# Patient Record
Sex: Female | Born: 1955 | Race: White | Hispanic: No | Marital: Married | Smoking: Former smoker
Health system: Southern US, Community
[De-identification: ages and names within clinical notes are randomized; demographics above are authoritative.]

## PROBLEM LIST (undated history)

## (undated) DIAGNOSIS — F32A Depression, unspecified: Secondary | ICD-10-CM

## (undated) DIAGNOSIS — G4730 Sleep apnea, unspecified: Secondary | ICD-10-CM

## (undated) DIAGNOSIS — Z973 Presence of spectacles and contact lenses: Secondary | ICD-10-CM

## (undated) DIAGNOSIS — M25569 Pain in unspecified knee: Secondary | ICD-10-CM

## (undated) DIAGNOSIS — R5383 Other fatigue: Secondary | ICD-10-CM

## (undated) DIAGNOSIS — I10 Essential (primary) hypertension: Secondary | ICD-10-CM

## (undated) DIAGNOSIS — T7840XA Allergy, unspecified, initial encounter: Secondary | ICD-10-CM

## (undated) DIAGNOSIS — J40 Bronchitis, not specified as acute or chronic: Secondary | ICD-10-CM

## (undated) DIAGNOSIS — M2550 Pain in unspecified joint: Secondary | ICD-10-CM

## (undated) DIAGNOSIS — F419 Anxiety disorder, unspecified: Secondary | ICD-10-CM

## (undated) DIAGNOSIS — M549 Dorsalgia, unspecified: Secondary | ICD-10-CM

## (undated) DIAGNOSIS — E538 Deficiency of other specified B group vitamins: Secondary | ICD-10-CM

## (undated) DIAGNOSIS — K219 Gastro-esophageal reflux disease without esophagitis: Secondary | ICD-10-CM

## (undated) DIAGNOSIS — R0989 Other specified symptoms and signs involving the circulatory and respiratory systems: Secondary | ICD-10-CM

## (undated) DIAGNOSIS — D649 Anemia, unspecified: Secondary | ICD-10-CM

## (undated) DIAGNOSIS — M1990 Unspecified osteoarthritis, unspecified site: Secondary | ICD-10-CM

## (undated) DIAGNOSIS — F329 Major depressive disorder, single episode, unspecified: Secondary | ICD-10-CM

## (undated) HISTORY — DX: Depression, unspecified: F32.A

## (undated) HISTORY — DX: Bronchitis, not specified as acute or chronic: J40

## (undated) HISTORY — DX: Anemia, unspecified: D64.9

## (undated) HISTORY — DX: Deficiency of other specified B group vitamins: E53.8

## (undated) HISTORY — DX: Pain in unspecified joint: M25.50

## (undated) HISTORY — PX: JOINT REPLACEMENT: SHX530

## (undated) HISTORY — PX: REPLACEMENT TOTAL KNEE: SUR1224

## (undated) HISTORY — DX: Allergy, unspecified, initial encounter: T78.40XA

## (undated) HISTORY — DX: Other fatigue: R53.83

## (undated) HISTORY — DX: Anxiety disorder, unspecified: F41.9

## (undated) HISTORY — PX: BREAST SURGERY: SHX581

## (undated) HISTORY — PX: COLONOSCOPY: SHX174

## (undated) HISTORY — DX: Unspecified osteoarthritis, unspecified site: M19.90

## (undated) HISTORY — PX: SMALL INTESTINE SURGERY: SHX150

## (undated) HISTORY — DX: Presence of spectacles and contact lenses: Z97.3

## (undated) HISTORY — DX: Gastro-esophageal reflux disease without esophagitis: K21.9

## (undated) HISTORY — DX: Dorsalgia, unspecified: M54.9

## (undated) HISTORY — DX: Sleep apnea, unspecified: G47.30

## (undated) HISTORY — DX: Major depressive disorder, single episode, unspecified: F32.9

## (undated) HISTORY — DX: Other specified symptoms and signs involving the circulatory and respiratory systems: R09.89

## (undated) HISTORY — DX: Pain in unspecified knee: M25.569

## (undated) HISTORY — DX: Essential (primary) hypertension: I10

---

## 1983-11-01 HISTORY — PX: TUBAL LIGATION: SHX77

## 2001-10-17 ENCOUNTER — Ambulatory Visit (HOSPITAL_COMMUNITY): Admission: RE | Admit: 2001-10-17 | Discharge: 2001-10-17 | Payer: Self-pay | Admitting: Internal Medicine

## 2004-08-04 ENCOUNTER — Ambulatory Visit (HOSPITAL_BASED_OUTPATIENT_CLINIC_OR_DEPARTMENT_OTHER): Admission: RE | Admit: 2004-08-04 | Discharge: 2004-08-04 | Payer: Self-pay | Admitting: *Deleted

## 2006-09-15 ENCOUNTER — Ambulatory Visit: Payer: Self-pay | Admitting: Gastroenterology

## 2006-10-20 ENCOUNTER — Ambulatory Visit: Payer: Self-pay | Admitting: Internal Medicine

## 2006-10-20 ENCOUNTER — Ambulatory Visit (HOSPITAL_COMMUNITY): Admission: RE | Admit: 2006-10-20 | Discharge: 2006-10-20 | Payer: Self-pay | Admitting: Internal Medicine

## 2009-12-17 ENCOUNTER — Ambulatory Visit: Payer: Self-pay | Admitting: Internal Medicine

## 2009-12-17 DIAGNOSIS — R1310 Dysphagia, unspecified: Secondary | ICD-10-CM | POA: Insufficient documentation

## 2009-12-17 DIAGNOSIS — R112 Nausea with vomiting, unspecified: Secondary | ICD-10-CM | POA: Insufficient documentation

## 2009-12-17 DIAGNOSIS — K219 Gastro-esophageal reflux disease without esophagitis: Secondary | ICD-10-CM

## 2009-12-18 ENCOUNTER — Encounter: Payer: Self-pay | Admitting: Internal Medicine

## 2009-12-25 ENCOUNTER — Ambulatory Visit: Payer: Self-pay | Admitting: Internal Medicine

## 2009-12-25 ENCOUNTER — Ambulatory Visit (HOSPITAL_COMMUNITY): Admission: RE | Admit: 2009-12-25 | Discharge: 2009-12-25 | Payer: Self-pay | Admitting: Internal Medicine

## 2010-01-04 ENCOUNTER — Encounter: Payer: Self-pay | Admitting: Internal Medicine

## 2010-02-01 ENCOUNTER — Ambulatory Visit: Payer: Self-pay | Admitting: Internal Medicine

## 2010-11-25 ENCOUNTER — Other Ambulatory Visit (HOSPITAL_COMMUNITY): Payer: Self-pay | Admitting: Surgery

## 2010-11-29 ENCOUNTER — Encounter
Admission: RE | Admit: 2010-11-29 | Discharge: 2010-11-30 | Payer: Self-pay | Source: Home / Self Care | Attending: Surgery | Admitting: Surgery

## 2010-11-30 NOTE — Assessment & Plan Note (Signed)
Summary: dysphagia,GERD/ss   Visit Type:  Initial Consult Referring Provider:  Charm Barges Primary Care Provider:  Charm Barges  Chief Complaint:  dysphagia/gerd.  History of Present Illness: Ms. Patricia Carr is a pleasant 55 year old Caucasian female, patient of Dr. Samuel Jester, who presents for further evaluation of chronic intermittent nausea and vomiting and reflux symptoms. Symptoms have been occuring for the last one year. They seem to be worse lately. She's having up to 2 days a week when she wakes up vomiting yellow, bitter fluid. She tries not eating too late at night. She states she does have some heartburn and epigastric discomfort. When she eats or drinks hot or cold things she has burning in her chest. She had been on Prevacid 30 mg b.i.d. with good results up until the last several months. She stopped the Prevacid and started Dexilant one week ago. She really hasn't noticed any difference yet. She also complains of difficulty swallowing solid foods. She states that 2 years ago she had an upper GI series at [facility] and she had obvious refluxing on that study. She had an EGD in the remote past. She previously failed Prilosec. She has alternating constipation and diarrhea. She stays more constipated except during her menstrual cycles. She recently started Restora and has had better regulation of her bowel movements. She denies any melena or rectal bleeding. She also c/o chronic dry cough, hoarseness. Wt gain of 50 pounds in the last five years.  Labs from February 2011 showed normal creatinine, LFTs normal, CBC normal, TSH normal.      Current Medications (verified): 1)  C-Pap .... As Directed 2)  Calcium 500 Mg Plus D .... Take 1 Tablet By Mouth Two Times A Day 3)  Vitamin D 500 Iu .... Take 1 Tablet By Mouth Twice A Day 4)  Glucosamine .... Take 1 Tablet By Mouth Once A Day 5)  Asa 81 Mg .... Take 2 Daily Tablets Daily With Niaspan 6)  Niaspan 500 Mg Cr-Tabs (Niacin (Antihyperlipidemic)) ....  Take Four Tablets Once Daily 7)  Wellbutrin Xl 300 Mg Xr24h-Tab (Bupropion Hcl) .... Take 1 Tablet By Mouth Once A Day 8)  Losartan Potassium-Hctz 100-25 Mg Tabs (Losartan Potassium-Hctz) .... Take 1 Tablet By Mouth Once A Day 9)  Fish Oil 1200 Mg .... One Tablet Daily 10)  Dexilant 60 Mg Cpdr (Dexlansoprazole) .... Take 1 Tablet By Mouth Once A Day 11)  Balziva 0.4-35 Mg-Mcg Tabs (Norethindrone-Eth Estradiol) .... As Needed 12)  Restora  Caps (Probiotic Product) .... One By Mouth Daily  Allergies (verified): 1)  ! Iodine  Past History:  Past Medical History: GERD Hyperlipidemia Hypertension Obesity Sleep apnea Colonoscopy 12/07, diverticulosis Colonoscopy 12/02, diverticulosis  Past Surgical History: Tubal Ligation  Family History: Mother, colon cancer Father, 72, deceased [place] Sister, colon polyps at 79 Maternal aunt, cousin, grandmother all with h/o CRC.  Social History: Married. Two children. Engineer, production, [employer]. Never habitual smoker. No alcohol. No drugs.  Review of Systems General:  Denies fever, chills, sweats, anorexia, and weight loss. Eyes:  Denies vision loss. ENT:  Complains of hoarseness and difficulty swallowing; denies nasal congestion and sore throat. CV:  Denies chest pains, angina, palpitations, dyspnea on exertion, and peripheral edema. Resp:  Complains of cough; denies dyspnea at rest and dyspnea with exercise; dry cough. GI:  See HPI. GU:  Denies urinary burning and blood in urine. MS:  Denies joint pain / LOM. Derm:  Denies rash and itching. Neuro:  Denies weakness, frequent headaches, memory loss,  and confusion. Psych:  Denies depression and anxiety. Endo:  Denies unusual weight change. Heme:  Denies bruising and bleeding. Allergy:  Denies hives and rash.  Vital Signs:  Patient profile:   55 year old female Height:      63 inches Weight:      244 pounds BMI:     43.38 Temp:     98.7 degrees F oral Pulse rate:   76 / minute BP  sitting:   138 / 80  (left arm) Cuff size:   regular  Vitals Entered By: Cloria Spring LPN (December 17, 2009 2:24 PM)  Physical Exam  General:  Well developed, well nourished, no acute distress.obese.   Head:  Normocephalic and atraumatic. Eyes:  Conjunctivae pink, no scleral icterus.  Mouth:  Oropharyngeal mucosa moist, pink.  No lesions, erythema or exudate.    Neck:  Supple; no masses or thyromegaly. Lungs:  Clear throughout to auscultation. Heart:  Regular rate and rhythm; no murmurs, rubs,  or bruits. Abdomen:  normal bowel sounds, obese, no hernia, no tenderness, no masses, and no hepatomegally or splenomegaly.  No abd bruit or hernia. Extremities:  No clubbing, cyanosis, edema or deformities noted. Neurologic:  Alert and  oriented x4;  grossly normal neurologically. Skin:  Intact without significant lesions or rashes. Cervical Nodes:  No significant cervical adenopathy. Psych:  Alert and cooperative. Normal mood and affect.  Impression & Recommendations:  Problem # 1:  GERD (ICD-530.81)  Gerd symptoms including heartburn, possible LPR with chronic cough and hoarseness but c/o dysphagia/odynophagia. Early morning N/V. Unlikely due to biliary disease. ?ulcerative reflux esophagitis, esophageal stricture. Symptoms poorly controlled on double dose Prevacid. Now on Dexilant. Would recommend EGD possible ED. EGD to be performed in near future.  Risks, alternatives, benefits including but not limited to risk of reaction to medications, bleeding, infection, and perforation addressed.  Patient voiced understanding and verbal consent obtained. Hold ASA four days. Continue Dexilant for now to see if responds but would consider taking 30 mins before evening meal. Antireflux information provided. Recommend wt reduction of at least 20 pounds.  Orders: Consultation Level III ([address])    I would like to thank Prudy Feeler, PA-C with Dr. Samuel Jester for allowing [place] to take part in the care of  this nice patient.

## 2010-11-30 NOTE — Letter (Signed)
Summary: REFERRAL/DR BUTLER  REFERRAL/DR BUTLER   Imported By: Diana Eves 12/18/2009 15:21:13  _____________________________________________________________________  External Attachment:    Type:   Image     Comment:   External Document

## 2010-11-30 NOTE — Letter (Signed)
Summary: EGD ORDER  EGD ORDER   Imported By: Ave Filter 12/17/2009 17:02:45  _____________________________________________________________________  External Attachment:    Type:   Image     Comment:   External Document

## 2010-11-30 NOTE — Letter (Signed)
Summary: Patient Notice, Endo Biopsy Results  [facility]  [address]   [place], [place] [address]   Phone: [phone]  Fax: [phone]       January 04, 2010   Patricia Carr [address] [place], [place]  [address] Dec 21, 1955    Dear Patricia Carr,  I am pleased to inform you that the biopsies taken during your recent endoscopic examination did not show any evidence of infection or cancer upon pathologic examination.  They did show mild inflammmation.  Additional information/recommendations:  Continue with the treatment plan as outlined on the day of your exam.  Please call us if you are having persistent problems or have questions about your condition that have not been fully answered at this time.  Sincerely,    R. Roetta Sessions MD  [facility] Ph: [phone]   Fax: [phone]   Appended Document: Patient Notice, Endo Biopsy Results Letter mailed.

## 2010-11-30 NOTE — Assessment & Plan Note (Signed)
Summary: PP FU from EGD- dysphagia, reflux, GLU   Primary Care Provider:  Dr.  Charm Barges  Chief Complaint:  follow up from procedure and doing ok.  History of Present Illness: 55 y/o caucasian female w/ recent EGD w/ esophageal dilatation. Nausea much better, dysphagia much better.  One episode of vomiting after eating spicy food late.  Denies GERD.  Bm normal daily.  Now on culterelle once daily.  Denies abd pain.  c/o bloating.  Trying high fiber diet.  Wants to lose weight.  FH colon ca in mother.  On Dexilant 60mg  daily.  Current Problems (verified): 1)  Colorectal Cancer, Family Hx  (ICD-V16.0) 2)  Nausea With Vomiting  (ICD-787.01) 3)  Gerd  (ICD-530.81) 4)  Dysphagia Unspecified  (ICD-787.20)  Current Medications (verified): 1)  C-Pap .... As Directed 2)  Calcium 500 Mg Plus D .... Take 1 Tablet By Mouth Two Times A Day 3)  Vitamin D 500 Iu .... Take 1 Tablet By Mouth Twice A Day 4)  Asa 81 Mg .... Take 2 Daily Tablets Daily With Niaspan 5)  Niaspan 500 Mg Cr-Tabs (Niacin (Antihyperlipidemic)) .... Take Four Tablets Once Daily 6)  Wellbutrin Xl 300 Mg Xr24h-Tab (Bupropion Hcl) .... Take 1 Tablet By Mouth Once A Day 7)  Losartan Potassium-Hctz 100-25 Mg Tabs (Losartan Potassium-Hctz) .... Take 1 Tablet By Mouth Once A Day 8)  Fish Oil 1200 Mg .... One Tablet Daily 9)  Dexilant 60 Mg Cpdr (Dexlansoprazole) .... Take 1 Tablet By Mouth Once A Day 10)  Balziva 0.4-35 Mg-Mcg Tabs (Norethindrone-Eth Estradiol) .... As Needed 11)  Probiotics .... Once Daily  Allergies (verified): 1)  ! Iodine  Vital Signs:  Patient profile:   55 year old female Height:      63 inches Weight:      242 pounds BMI:     43.02 Temp:     97.8 degrees F oral Pulse rate:   84 / minute BP sitting:   130 / 78  (left arm) Cuff size:   regular  Vitals Entered By: Hendricks Limes LPN (February 01, 6300 3:16 PM)  Physical Exam  General:  obese.  Well developed, no acute distress. Head:  Normocephalic and  atraumatic. Eyes:  Sclera clear. Ears:  Normal auditory acuity. Nose:  No deformity, discharge,  or lesions. Mouth:  No deformity or lesions, dentition normal. Neck:  Supple; no masses or thyromegaly. Lungs:  Clear throughout to auscultation. Heart:  Regular rate and rhythm; no murmurs, rubs,  or bruits. Abdomen:  Soft, nontender and nondistended. No masses, hepatosplenomegaly or hernias noted. Normal bowel sounds.without guarding and without rebound.   Msk:  Symmetrical with no gross deformities. Normal posture. Pulses:  Normal pulses noted. Extremities:  No clubbing, cyanosis, edema or deformities noted. Neurologic:  Alert and  oriented x4;  grossly normal neurologically. Skin:  Intact without significant lesions or rashes. Cervical Nodes:  No significant cervical adenopathy. Psych:  Alert and cooperative. Normal mood and affect.  Impression & Recommendations:  Problem # 1:  GERD (ICD-530.81) 55 y/o caucasian female w/ erosive reflux esophagitis and recent empiric dilatation.  She is doing very well on dexilant 60mg  daily.  Orders: Est. Patient Level III (60109)  Problem # 2:  DYSPHAGIA UNSPECIFIED (ICD-787.20) See #1  Problem # 3:  COLORECTAL CANCER, FAMILY HX (ICD-V16.0) Assessment: Comment Only  Patient Instructions: 1)  Continue dexilant 60mg  daily 2)  Weight loss 1-2# PER WEEK 3)  Appt prior to colonoscopy 10/2011 or sooner if  needed

## 2010-12-13 ENCOUNTER — Ambulatory Visit (HOSPITAL_COMMUNITY)
Admission: RE | Admit: 2010-12-13 | Discharge: 2010-12-13 | Disposition: A | Discharge status: Home / Self Care | Payer: PRIVATE HEALTH INSURANCE | Source: Ambulatory Visit | Attending: Surgery | Admitting: Surgery

## 2010-12-13 ENCOUNTER — Other Ambulatory Visit (HOSPITAL_COMMUNITY): Payer: Self-pay

## 2010-12-13 DIAGNOSIS — I10 Essential (primary) hypertension: Secondary | ICD-10-CM | POA: Insufficient documentation

## 2010-12-13 DIAGNOSIS — Z6841 Body Mass Index (BMI) 40.0 and over, adult: Secondary | ICD-10-CM | POA: Insufficient documentation

## 2010-12-13 DIAGNOSIS — Z13820 Encounter for screening for osteoporosis: Secondary | ICD-10-CM | POA: Insufficient documentation

## 2010-12-13 DIAGNOSIS — G4733 Obstructive sleep apnea (adult) (pediatric): Secondary | ICD-10-CM | POA: Insufficient documentation

## 2010-12-13 DIAGNOSIS — K219 Gastro-esophageal reflux disease without esophagitis: Secondary | ICD-10-CM | POA: Insufficient documentation

## 2010-12-13 DIAGNOSIS — R0602 Shortness of breath: Secondary | ICD-10-CM | POA: Insufficient documentation

## 2010-12-13 DIAGNOSIS — E785 Hyperlipidemia, unspecified: Secondary | ICD-10-CM | POA: Insufficient documentation

## 2010-12-13 DIAGNOSIS — K449 Diaphragmatic hernia without obstruction or gangrene: Secondary | ICD-10-CM | POA: Insufficient documentation

## 2010-12-16 ENCOUNTER — Ambulatory Visit (HOSPITAL_COMMUNITY): Payer: PRIVATE HEALTH INSURANCE

## 2010-12-20 ENCOUNTER — Ambulatory Visit (HOSPITAL_COMMUNITY): Payer: PRIVATE HEALTH INSURANCE

## 2011-02-17 ENCOUNTER — Encounter: Payer: PRIVATE HEALTH INSURANCE | Attending: Surgery

## 2011-02-17 DIAGNOSIS — Z713 Dietary counseling and surveillance: Secondary | ICD-10-CM | POA: Insufficient documentation

## 2011-02-17 DIAGNOSIS — Z01818 Encounter for other preprocedural examination: Secondary | ICD-10-CM | POA: Insufficient documentation

## 2011-03-01 HISTORY — PX: ROUX-EN-Y PROCEDURE: SUR1287

## 2011-03-08 ENCOUNTER — Encounter (HOSPITAL_COMMUNITY): Payer: PRIVATE HEALTH INSURANCE | Attending: Surgery

## 2011-03-08 ENCOUNTER — Other Ambulatory Visit: Payer: Self-pay | Admitting: Surgery

## 2011-03-08 DIAGNOSIS — Z01812 Encounter for preprocedural laboratory examination: Secondary | ICD-10-CM | POA: Insufficient documentation

## 2011-03-08 DIAGNOSIS — K219 Gastro-esophageal reflux disease without esophagitis: Secondary | ICD-10-CM | POA: Insufficient documentation

## 2011-03-08 DIAGNOSIS — G4733 Obstructive sleep apnea (adult) (pediatric): Secondary | ICD-10-CM | POA: Insufficient documentation

## 2011-03-08 DIAGNOSIS — Z6841 Body Mass Index (BMI) 40.0 and over, adult: Secondary | ICD-10-CM | POA: Insufficient documentation

## 2011-03-08 DIAGNOSIS — I10 Essential (primary) hypertension: Secondary | ICD-10-CM | POA: Insufficient documentation

## 2011-03-08 LAB — DIFFERENTIAL
Basophils Absolute: 0 10*3/uL (ref 0.0–0.1)
Basophils Relative: 0 % (ref 0–1)
Eosinophils Absolute: 0.1 10*3/uL (ref 0.0–0.7)
Eosinophils Relative: 2 % (ref 0–5)
Lymphocytes Relative: 24 % (ref 12–46)
Lymphs Abs: 1.7 10*3/uL (ref 0.7–4.0)
Monocytes Absolute: 0.4 10*3/uL (ref 0.1–1.0)
Monocytes Relative: 6 % (ref 3–12)
Neutro Abs: 4.8 10*3/uL (ref 1.7–7.7)
Neutrophils Relative %: 68 % (ref 43–77)

## 2011-03-08 LAB — CBC
MCH: 30.7 pg (ref 26.0–34.0)
MCHC: 34.4 g/dL (ref 30.0–36.0)
Platelets: 238 10*3/uL (ref 150–400)
RDW: 13.7 % (ref 11.5–15.5)

## 2011-03-08 LAB — COMPREHENSIVE METABOLIC PANEL
AST: 25 U/L (ref 0–37)
CO2: 29 mEq/L (ref 19–32)
Calcium: 10.1 mg/dL (ref 8.4–10.5)
Creatinine, Ser: 0.77 mg/dL (ref 0.4–1.2)
GFR calc Af Amer: >60 mL/min (ref >60)
GFR calc non Af Amer: >60 mL/min (ref >60)
Sodium: 136 mEq/L (ref 135–145)
Total Protein: 7.1 g/dL (ref 6.0–8.3)

## 2011-03-08 LAB — HCG, SERUM, QUALITATIVE: Preg, Serum: NEGATIVE

## 2011-03-14 ENCOUNTER — Inpatient Hospital Stay (HOSPITAL_COMMUNITY)
Admission: RE | Admit: 2011-03-14 | Discharge: 2011-03-16 | DRG: 621 | Disposition: A | Discharge status: Home / Self Care | Payer: PRIVATE HEALTH INSURANCE | Source: Ambulatory Visit | Attending: Surgery | Admitting: Surgery

## 2011-03-14 DIAGNOSIS — Z7982 Long term (current) use of aspirin: Secondary | ICD-10-CM

## 2011-03-14 DIAGNOSIS — Z79899 Other long term (current) drug therapy: Secondary | ICD-10-CM

## 2011-03-14 DIAGNOSIS — I10 Essential (primary) hypertension: Secondary | ICD-10-CM | POA: Diagnosis present

## 2011-03-14 DIAGNOSIS — Z6841 Body Mass Index (BMI) 40.0 and over, adult: Secondary | ICD-10-CM

## 2011-03-14 DIAGNOSIS — Z01812 Encounter for preprocedural laboratory examination: Secondary | ICD-10-CM

## 2011-03-14 DIAGNOSIS — K219 Gastro-esophageal reflux disease without esophagitis: Secondary | ICD-10-CM | POA: Diagnosis present

## 2011-03-14 DIAGNOSIS — G4733 Obstructive sleep apnea (adult) (pediatric): Secondary | ICD-10-CM | POA: Diagnosis present

## 2011-03-14 LAB — HEMOGLOBIN AND HEMATOCRIT, BLOOD
HCT: 39.6 % (ref 36.0–46.0)
Hemoglobin: 13.7 g/dL (ref 12.0–15.0)

## 2011-03-15 ENCOUNTER — Ambulatory Visit (HOSPITAL_COMMUNITY): Payer: PRIVATE HEALTH INSURANCE

## 2011-03-15 DIAGNOSIS — Z09 Encounter for follow-up examination after completed treatment for conditions other than malignant neoplasm: Secondary | ICD-10-CM

## 2011-03-15 LAB — DIFFERENTIAL
Basophils Absolute: 0 10*3/uL (ref 0.0–0.1)
Eosinophils Absolute: 0 10*3/uL (ref 0.0–0.7)
Eosinophils Relative: 0 % (ref 0–5)
Lymphocytes Relative: 6 % — ABNORMAL LOW (ref 12–46)
Neutrophils Relative %: 89 % — ABNORMAL HIGH (ref 43–77)

## 2011-03-15 LAB — CBC
HCT: 38.5 % (ref 36.0–46.0)
Platelets: 243 10*3/uL (ref 150–400)
RBC: 4.31 MIL/uL (ref 3.87–5.11)
RDW: 13.9 % (ref 11.5–15.5)
WBC: 11.7 10*3/uL — ABNORMAL HIGH (ref 4.0–10.5)

## 2011-03-15 LAB — HEMOGLOBIN AND HEMATOCRIT, BLOOD: HCT: 36.4 % (ref 36.0–46.0)

## 2011-03-15 MED ORDER — IOHEXOL 300 MG/ML  SOLN: 50.0000 mL | Freq: Once | INTRAMUSCULAR | Status: AC | PRN

## 2011-03-16 LAB — CBC
Platelets: 211 10*3/uL (ref 150–400)
RBC: 3.66 MIL/uL — ABNORMAL LOW (ref 3.87–5.11)
WBC: 8.8 10*3/uL (ref 4.0–10.5)

## 2011-03-16 LAB — DIFFERENTIAL
Basophils Relative: 0 % (ref 0–1)
Eosinophils Absolute: 0.1 10*3/uL (ref 0.0–0.7)
Neutrophils Relative %: 69 % (ref 43–77)

## 2011-03-17 NOTE — Op Note (Signed)
NAMESOPHINA, MITTEN               ACCOUNT NO.:  [id number]  MEDICAL RECORD NO.:  [id number]           PATIENT TYPE:  O  LOCATION:  1537                         FACILITY:  [facility]  PHYSICIAN:  Thornton Park. Daphine Deutscher, MD  DATE OF BIRTH:  09/15/1956  DATE OF PROCEDURE:  03/14/2011 DATE OF DISCHARGE:                              OPERATIVE REPORT   PREOPERATIVE DIAGNOSES:  Morbid obesity, BMI mid 40s, with gastroesophageal reflux disease, hypertension, obstructive sleep apnea.  SURGEON:  Thornton Park. Daphine Deutscher, MD  ASSISTANT:  Sandria Bales. Ezzard Standing, MD  PROCEDURE:  Laparoscopic Roux-en-Y gastric bypass with a 40 cm BP limb, 100 cm Roux limb, antecolic antegastric candy cane to the left with closure of Petersen defect.  DESCRIPTION OF PROCEDURE:  The patient was taken to room #1 on the afternoon of Monday, Mar 14, 2011, given general anesthesia.  The abdomen was prepped with PCMX and draped sterilely.  I entered the abdomen through the left upper quadrant using a 0 degree OptiVu without difficulty.  The abdomen was insufflated and standard trocar placement was used using balloon-tip of five medical trocars.  Eventually, we used a Nathanson in the upper midline.  First, however, we identified the ligament of Treitz, interestingly, this sat underneath a kind of a shelf where the appendices of a bulk of the transverse mesocolon were stuck down to the mesentery.  The ligament of Treitz was then measured from there for a distance of 40 cm.  There, we divided it with a 6-cm white cartridge Covidien GIA.  A Penrose drain was sewn to the Roux limb site. We then measured 100 cm distally and then placed the bowel loops side-to- side and put a stitch to hold them together.  There were opened on the antimesenteric borders and a 4.5 cm white load was used to create an anastomosis.  Common defect was closed from either end with 2-0 Vicryls EndoStitch.  The mesenteric defect was closed with running 2-0 silk after  nicely exposing it.  The omentum was divided with a Harmonic scalpel.  Next, with a Satira Mccallum in place, although she did have numerous adhesions holding the left lateral segment up, we went up and looked for really a significant hiatal hernia, really did not see much and there was a little laxity anteriorly.  We did not spot a dimple or anything that was obvious.  We elected to then create a space in the cardia and then measure 5 cm down, we entered and got into the retrogastric space. Two applications of the 40 cm GIA were used followed by a single Duet going upward and another 4.5 mm Duet to complete the pouch.  We used the Ewald tube to help calibrate, we made sure we did not impinge on the EG junction.  The Roux limb was then brought up and sewn to the back wall with a running 2-0 Vicryl.  It was opened along the right side and a 4.5 mm blue load was inserted and fired creating anastomosis.  The common defect was closed from either end with 2-0 Vicryl.  A second layer of 2- 0 Vicryl was used  to free needle with stenting the anastomosis.  The Petersen defect was closed with figure-of-eight suture of 2-0 silk.  We clamped off the defect.  Dr. Ezzard Standing did endoscope the patient revealing a nice tubular pouch with a good anastomosis.  No bleeding or leaks were noted. We looked for bubbles and none were seen.  Tisseel was then applied to the anastomosis.  We removed the [facility] and closed the wounds with 4- 0 Vicryl and benzoin and Steri-Strips.  The patient tolerated the procedure well, was taken to recovery room in satisfactory addition.     Thornton Park Daphine Deutscher, MD     MBM/MEDQ  D:  03/14/2011  T:  03/15/2011  Job:  [id number]  Electronically Signed by Luretha Murphy MD on 03/17/2011 07:07:27 AM

## 2011-03-18 NOTE — Op Note (Signed)
[facility]  Patient:    VERNEL, DONLAN Visit Number: [id number] MRN: [id number]          Service Type: DSU Location: DAY Attending Physician:  Jonathon Bellows Dictated by:   Roetta Sessions, M.D. Proc. Date: 10/17/01 Admit Date:  10/17/2001   CC:         Colon Flattery, D.O.   Operative Report  PROCEDURE:  High-risk screening colonoscopy.  ENDOSCOPIST:  Roetta Sessions, M.D.  INDICATIONS FOR PROCEDURE:  Patient is a 55 year old lady referred for high-risk colorectal cancer screening.  Her mother was just diagnosed with colorectal cancer by me at [facility].  Patient is referred for colonoscopy.  She has a little intermittent constipation and diarrhea around the time of her menses, otherwise, she is devoid of any GI symptoms.  She has not had any rectal bleeding.  Colonoscopy is now being done as a high-risk screening maneuver and this approach has been discussed with the patient at some length and potential risks, benefits and alternatives have been reviewed and questions answered.  It is notable she has multiple other relatives with colorectal cancer.  I feel she is low risk for conscious sedation.  Please see my dictated consultation note for more information.  DESCRIPTION OF PROCEDURE:  O2 saturation, blood pressure, pulse and respirations were monitored throughout the entire procedure.  Conscious sedation:  Versed 4 mg IV and Demerol 75 mg in divided doses.  INSTRUMENT:  Olympus video chip colonoscope.  FINDINGS:  Digital rectal exam revealed no abnormalities.  ENDOSCOPIC FINDINGS:  Prep was good.  Rectum:  Examination of the rectal mucosa including a retroflexed view of the anal verge revealed no abnormalities.  Colon:  Colonic mucosa was surveyed from the rectosigmoid junction through the left, transverse and right colon to the area of the appendiceal orifice, ileocecal valve and cecum.  These structures were well-seen and  photographed for the record.  Aside from a couple of left-sided diverticula, the colonic mucosa appeared normal.  From the level of the cecum and ileocecal valve (see photos), the scope was slowly and cautiously withdrawn.  All previously mentioned mucosal surfaces were again seen and again, no other abnormalities were observed.  The patient tolerated the procedure well and was reactive at endoscopy.  IMPRESSION: 1. Normal rectum. 2. A few scattered left-sided diverticula. 3. Remainder of colonic mucosa appeared normal.  RECOMMENDATIONS: 1. Follow up with Dr. Colon Flattery. 2. Repeat colonoscopy in five years. 3. Diverticulosis literature provided to Ms. Schaffer. Dictated by:   Roetta Sessions, M.D. Attending Physician:  Jonathon Bellows DD:  10/17/01 TD:  10/17/01 Job: [id number]

## 2011-03-18 NOTE — Op Note (Signed)
Patricia Carr, Patricia Carr               ACCOUNT NO.:  [id number]   MEDICAL RECORD NO.:  [id number]          PATIENT TYPE:  AMB   LOCATION:  DAY                           FACILITY:  [facility]   PHYSICIAN:  R. Roetta Sessions, M.D. DATE OF BIRTH:  [birth year]   DATE OF PROCEDURE:  10/20/2006  DATE OF DISCHARGE:                               OPERATIVE REPORT   PROCEDURE:  Screening colonoscopy.   INDICATIONS FOR PROCEDURE:  The patient is a 55 year old lady with a  positive family history of colon cancer in her mother who, on her last  colonoscopy in 2002, she had scattered left sided diverticula.  She is  here for high risk screening.  She has no lower tract symptoms.  Colonoscopy is now being done.  This approach has been discussed with  the patient along with the potential risks, benefits, and alternatives.   DESCRIPTION OF PROCEDURE:  O2 saturation, blood pressure, and pulse rate  were monitored throughout the entire procedure.  Conscious sedation with  Versed 4 mg IV and Demerol 75 mg IV in divided doses.   INSTRUMENT USED:  Pentax video chip system.   FINDINGS:  Digital rectal examination revealed no abnormalities.  Endoscopic findings:  The prep was good.  Colon:  The colonic mucosa was surveyed from the rectosigmoid colon  through the left, transverse, right colon, to the area of the  appendiceal orifice and ileocecal valve and cecum.  These structures  were well seen and photographed for the record.  From this level, the  scope was slowly withdrawn.  All previously mentioned mucosal surfaces  were again seen.  The patient had a few scattered left sided  diverticula.  The remainder of the colonic mucosa appeared normal.  The  scope was pulled down in the rectum where a thorough examination of the  rectal mucosa including a retroflex view of the anal verge was  undertaken.  The rectal mucosa appeared normal.  The patient tolerated  the procedure well and was reacted in endoscopy.   IMPRESSION:  Normal rectum, scattered left sided diverticula, the  remainder of the colonic mucosa appeared normal.   RECOMMENDATIONS:  1. Diverticulosis, literature given.  2. Repeat high risk screening colonoscopy in five years.      Jonathon Bellows, M.D.  Electronically Signed     RMR/MEDQ  D:  10/20/2006  T:  10/20/2006  Job:  [id number]   cc:   Samuel Jester  Fax: [phone]

## 2011-03-18 NOTE — Procedures (Signed)
NAMEMADGIE, DHALIWAL               ACCOUNT NO.:  [id number]   MEDICAL RECORD NO.:  [id number]          PATIENT TYPE:  OUT   LOCATION:  SLEEP CENTER                 FACILITY:  [facility]   PHYSICIAN:  Clinton D. Maple Hudson, M.D. DATE OF BIRTH:  [birth year]   DATE OF STUDY:  08/04/2004  DATE OF DISCHARGE:  08/04/2004                              NOCTURNAL POLYSOMNOGRAM   REFERRING PHYSICIAN:  Dr. Samuel Jester.   INDICATIONS FOR STUDY:  Insomnia with sleep apnea.  Epworth sleepiness score  17/24.  BMI 33, weight 190 pounds.   SLEEP ARCHITECTURE:  Total sleep time 374 minutes with sleep efficiency 79%.  Stage 1 was 9%, stage 2 was 63%, stages 3 and 4 were absent, REM was 28% of  total sleep time.  Sleep latency was 47 minutes, REM latency 105 minutes,  awake after sleep onset 54 minutes, arousal index 33.  Medications including  Wellbutrin XL and Lexapro might have some effect on sleep, but no specific  sleep medications were taken.   RESPIRATORY DATA:  Split study protocol.  RDI 37.3 per hour, indicating  moderately-severe obstructive sleep apnea/hypopnea syndrome before CPAP.  This included 21 obstructive apneas and 62 hypopneas.  The events were not  positional.  REM RDI was 13.  CPAP was titrated to 13 CWP, RDI 1.6 per hour,  using a small Respironics Comfort Gel nasal mask with heated humidifier.  Technician suggested the patient might benefit from a chin strap initially.   OXYGEN DATA:  Loud snoring with oxygen desaturation to a nadir of 74% during  apneas before CPAP.  After CPAP control, oxygen saturation held 95-96% on  room air.   CARDIAC DATA:  Normal sinus rhythm.   MOVEMENT/PARASOMNIA:  She was described as somewhat restless during the  night with complaints of back pain.  This may be a factor in sleep quality  at home.  A total of 158 limb jerks were recorded, of which 16 were  associated with arousal or awakening for a periodic limb movement with  arousal index of 2.6 per  hour, which is mildly increased.   IMPRESSION/RECOMMENDATION:  Moderately-severe obstructive sleep  apnea/hypopnea syndrome, RDI 37 per hour with desaturation to 74%.  Successful CPAP titration to 13 CWP, RDI 1.6 per hour, using a small  Respironics Comfort Gel nasal mask with heated humidifier.  Note that she  may benefit from a chin strap initially.  Periodic limb movement syndrome with arousal, 2.6 per hour.  This can be  reassessed clinically as appropriate after CPAP is established.  Note  complaint of back pain contributing to poor sleep quality.      CDY/MEDQ  D:  08/08/2004 11:13:59  T:  08/09/2004 15:42:26  Job:  [id number]

## 2011-03-22 NOTE — Op Note (Signed)
  Patricia, Carr               ACCOUNT NO.:  [id number]  MEDICAL RECORD NO.:  [id number]           PATIENT TYPE:  O  LOCATION:  1537                         FACILITY:  [facility]  PHYSICIAN:  Sandria Bales. Ezzard Standing, M.D.  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  PROCEDURE:  Esophagogastroduodenoscopy.  SURGEON:  Dav  ANESTHESIA:  General endotracheal.  BLOOD LOSS:  None.  INDICATIONS FOR PROCEDURE:  [name] is a 55 year old female who is undergoing a laparoscopic Roux-en-Y gastric bypass by Dr. Wenda Low.  I am doing upper endoscopy for evaluation of gastric pouch and evaluation of the anastomosis.  DESCRIPTION:  With the patient in a supine position, Dr. Daphine Deutscher was spanning the laparoscope and clamped off the jejunum.  I passed the flexible Olympus endoscope down her mouth into esophagus without difficulty.  I advanced the endoscope down into the gastric pouch.  The anastomosis was noted at 44 cm to 45 cm, the esophagogastric junction at 39 cm for about a 5 cm to 6 cm pouch.  The anastomosis was widely patent.  I insufflated air.  Dr. Daphine Deutscher flooded the upper abdomen with saline. There was no bubbles or evidence of leak.  The pouch itself looked good and healthy.  The staple line showed no evidence of bleeding.  The scope was then withdrawn into the esophagus which was unremarkable.  This is considered a normal intraoperative endoscopy of a Roux-en-Y gastric bypass.  Dr. Daphine Deutscher will dictate the remainder of this operation dictation.   Sandria Bales. Ezzard Standing, M.D., FACS   DHN/MEDQ  D:  03/14/2011  T:  03/14/2011  Job:  [id number]  Electronically Signed by Ovidio Kin M.D. on 03/22/2011 10:48:19 AM

## 2011-03-29 ENCOUNTER — Encounter: Payer: PRIVATE HEALTH INSURANCE | Attending: Surgery

## 2011-03-29 DIAGNOSIS — Z713 Dietary counseling and surveillance: Secondary | ICD-10-CM | POA: Insufficient documentation

## 2011-03-29 DIAGNOSIS — Z01818 Encounter for other preprocedural examination: Secondary | ICD-10-CM | POA: Insufficient documentation

## 2011-04-13 NOTE — Discharge Summary (Signed)
  Patricia Carr, Patricia Carr               ACCOUNT NO.:  [id number]  MEDICAL RECORD NO.:  [id number]           PATIENT TYPE:  O  LOCATION:  1537                         FACILITY:  [facility]  PHYSICIAN:  Thornton Park. Daphine Deutscher, MD  DATE OF BIRTH:  [birth year]  DATE OF ADMISSION:  03/14/2011 DATE OF DISCHARGE:  03/16/2011                              DISCHARGE SUMMARY   ADMITTING DIAGNOSIS:  Morbid obesity.  PROCEDURE:  Laparoscopic Roux-en-Y gastric bypass.  COURSE IN HOSPITAL:  Patricia Carr is a 55 year old white female admitted on Monday, Mar 14, 2011, for Roux-en-Y gastric bypass.  This was completed without apparent complications.  She underwent upper GI on postop day #1 which showed good pouch, free emptying.  She tolerated her postop 1 day and postop day 2 diets and was ready for discharge in the afternoon of Mar 16, 2011.  CONDITION:  Improved.  DISCHARGE DIAGNOSIS:  Morbid obesity status post laparoscopic Roux-en-Y gastric bypass.    Thornton Park Daphine Deutscher, MD     MBM/MEDQ  D:  03/17/2011  T:  03/17/2011  Job:  [id number]  Electronically Signed by Luretha Murphy MD on 04/13/2011 04:29:59 PM

## 2011-04-21 ENCOUNTER — Encounter (INDEPENDENT_AMBULATORY_CARE_PROVIDER_SITE_OTHER): Payer: Self-pay | Admitting: Surgery

## 2011-05-10 DIAGNOSIS — I10 Essential (primary) hypertension: Secondary | ICD-10-CM | POA: Insufficient documentation

## 2011-05-10 DIAGNOSIS — Z9884 Bariatric surgery status: Secondary | ICD-10-CM

## 2011-05-12 ENCOUNTER — Ambulatory Visit: Payer: PRIVATE HEALTH INSURANCE | Admitting: *Deleted

## 2011-05-12 ENCOUNTER — Encounter: Payer: PRIVATE HEALTH INSURANCE | Attending: Surgery | Admitting: *Deleted

## 2011-05-12 ENCOUNTER — Encounter: Payer: Self-pay | Admitting: *Deleted

## 2011-05-12 DIAGNOSIS — Z9884 Bariatric surgery status: Secondary | ICD-10-CM | POA: Insufficient documentation

## 2011-05-12 DIAGNOSIS — Z09 Encounter for follow-up examination after completed treatment for conditions other than malignant neoplasm: Secondary | ICD-10-CM | POA: Insufficient documentation

## 2011-05-12 DIAGNOSIS — Z713 Dietary counseling and surveillance: Secondary | ICD-10-CM | POA: Insufficient documentation

## 2011-05-12 NOTE — Patient Instructions (Addendum)
   Increase fluids (64oz) and protein (70-80g) to goal  Increase exercise levels  Advanced diet to Phase 3B- High Protein + Non-starchy vegetables  Increase calcium citrate intake  Follow up in 4-6 weeks

## 2011-05-12 NOTE — Progress Notes (Signed)
  Follow-up visit: 8 Weeks Post-Operative Gastric Bypass Surgery  Medical Nutrition Therapy:  Appt start time: 1500 end time:  1530.  Assessment:  Primary concerns today: post-operative bariatric surgery nutrition management. Pt doing well s/p gastric bypass surgery. Pt notes that she is feel great. She has added some carbohydrate foods into diet.  Weight today: 209.6 lbs Weight change: 18.1 lbs Total weight lost: 45.9 lbs BMI: 37.1%  Typical Day recall:  B (9 AM): Protein shake or 1/3 of an omelet with egg Snk (10  AM): Maybe 1 yogurt cup or SF pudding   L (12:30 PM): Tomato sandwich (2 pc bread) OR Chicken salad OR Soup (3oz) Snk (4 PM): Occasionally Protein shake   D (7 PM): Shrimp (3oz) OR Refried beans with cheese (3/4 cup) Snk (9 PM): SF pudding  Fluid intake: water, protein shake, sugar-free beverages = 32oz  Estimated total protein intake: 30-50 g  Medications: No changes per pt Supplementation: Taking bariatric multivitamin (bid), chewable calcium citrate (1500mg ), B12  Using straws: No Drinking while eating:No Hair loss: None reported Carbonated beverages: No N/V/D/C: Vomiting reported when eating too fast or not chewing Dumping syndrome: N/A   Recent physical activity: Walking some but not regular. Pt plans to start exercise classes  Progress Towards Goal(s):  In progress.   Nutritional Diagnosis:  NI-5.7.1 Inadequate protein intake As related to limited portion sizes .  As evidenced by pt consuming 65% of estimated protein needs..    Intervention:    Increase fluids (64oz) and protein (70-80g) to goal  Increase exercise levels  Advanced diet to Phase 3B- High Protein + Non-starchy vegetables  Increase calcium citrate intake  Follow up in 4-6 weeks  Monitoring/Evaluation:  Dietary intake, exercise, protein intake, and body weight. Follow up in 1 months for 3 month post-op visit.

## 2011-05-24 ENCOUNTER — Encounter (INDEPENDENT_AMBULATORY_CARE_PROVIDER_SITE_OTHER): Payer: Self-pay | Admitting: General Surgery

## 2011-05-25 ENCOUNTER — Encounter (INDEPENDENT_AMBULATORY_CARE_PROVIDER_SITE_OTHER): Payer: Self-pay | Admitting: Surgery

## 2011-05-25 ENCOUNTER — Ambulatory Visit (INDEPENDENT_AMBULATORY_CARE_PROVIDER_SITE_OTHER): Payer: PRIVATE HEALTH INSURANCE | Admitting: Surgery

## 2011-05-25 VITALS — BP 102/68 | Temp 98.4°F | Ht 62.0 in | Wt 204.0 lb

## 2011-05-25 DIAGNOSIS — Z9884 Bariatric surgery status: Secondary | ICD-10-CM

## 2011-05-25 NOTE — Progress Notes (Signed)
Patricia Carr comes in today for a almost 3 month visit after her Roux-en-Y gastric bypass Mar 14, 2011. So far she has lost about 45 pounds and looks good. Her weight is down to two over four. She's not having any severity symptoms at all. Neuro pleased her postoperative progress. Her sleep apnea is improved, her blood pressures improved and she doesn't have any GERD. I plan to see her again in 3 months at which time we will draw lab on her. I asked that she call our office the week before and remind [place] so that we can order to lab and she did have that done before we see her in the way we can review it. I've encouraged her to get plenty of exercise.

## 2011-06-30 ENCOUNTER — Encounter: Payer: Self-pay | Admitting: *Deleted

## 2011-06-30 ENCOUNTER — Encounter: Payer: PRIVATE HEALTH INSURANCE | Attending: Surgery | Admitting: *Deleted

## 2011-06-30 DIAGNOSIS — Z09 Encounter for follow-up examination after completed treatment for conditions other than malignant neoplasm: Secondary | ICD-10-CM | POA: Insufficient documentation

## 2011-06-30 DIAGNOSIS — Z713 Dietary counseling and surveillance: Secondary | ICD-10-CM | POA: Insufficient documentation

## 2011-06-30 DIAGNOSIS — Z9884 Bariatric surgery status: Secondary | ICD-10-CM | POA: Insufficient documentation

## 2011-06-30 NOTE — Progress Notes (Signed)
  Follow-up visit: 3 Month Post-Operative Gastric Bypass Surgery  Medical Nutrition Therapy:  Appt start time: 1500 end time:  1530.  Assessment:  Primary concerns today: post-operative bariatric surgery nutrition management.  Weight today: 192 Weight change: 17.6lbs Total weight lost: 63.5 lbs total BMI: 34.1 Weight goal: 140lbs % Weight goal met: 59%  24-hr recall:  B (6-7 AM): Protein shake OR yogurt cup OR 1 egg omlet Snk (10  AM): 1 oz cheese   L (12-1 PM): Chicken salad OR 2 oz pc grilled chicken Snk (3-4 PM): 1 oz cheese  D (6-8 PM): Chicken, shrimp or flounder w/ pinto beans OR Crock pot meats OR Chili OR Saurkraut w/ franks Snk (8-9 PM): Vanilla protein powder, ice, 1 tsp coffee (smoothie)  Fluid intake: Protein shake, water = 50-60 oz Estimated total protein intake: 50-60 oz  Medications: Off Dexiant and C-PAP Supplementation: Taking liquid mvi, calcium, biotin, and B12   Using straws: No Drinking while eating: No Hair loss: None reported Carbonated beverages: No N/V/D/C: None reported Dumping syndrome: Pt reports one episode of dumping  Recent physical activity:  Walking some yet no structured exercise.  Progress Towards Goal(s):  In progress.  Handouts given during visit include:  Low-Glycemic Carbohydrate foods  Carbohydrate counting information   Nutritional Diagnosis:  NI-5.7.1 Inadequate protein intake As related to small portions of protein rich foods.  As evidenced by pt meeting <75% estimated protein requirements.    Intervention:  Nutrition education.  Monitoring/Evaluation:  Dietary intake, exercise, protein intake, and body weight. Follow up in 3 months for 6 month post-op visit.

## 2011-06-30 NOTE — Patient Instructions (Signed)
Goals:  Follow Phase 3B: High Protein + Non-Starchy Vegetables  Eat 3-6 small meals/snacks, every 3-5 hrs  Increase lean protein foods to meet 60-80g goal  Increase fluid intake to 64oz +  Add 15 grams of carbohydrate (fruit, whole grain, starchy vegetable) with meals  Avoid drinking 15 minutes before, during and 30 minutes after eating  Aim for >30 min of physical activity daily  

## 2011-07-13 ENCOUNTER — Telehealth (INDEPENDENT_AMBULATORY_CARE_PROVIDER_SITE_OTHER): Payer: Self-pay | Admitting: Surgery

## 2011-07-28 ENCOUNTER — Telehealth (INDEPENDENT_AMBULATORY_CARE_PROVIDER_SITE_OTHER): Payer: Self-pay | Admitting: General Surgery

## 2011-07-28 NOTE — Telephone Encounter (Signed)
Pt called wanted to know if her PCP can run blood work up and then forward to Dr. Daphine Deutscher. Pt has appt with Dr. Daphine Deutscher on 09/02/11 and wants to make sure the correct test will be ordered. Pt stated message can be left on her work vmail as she has password only access to retrieve messages.

## 2011-08-23 ENCOUNTER — Encounter: Payer: Self-pay | Admitting: Internal Medicine

## 2011-09-02 ENCOUNTER — Encounter (INDEPENDENT_AMBULATORY_CARE_PROVIDER_SITE_OTHER): Payer: Self-pay | Admitting: Surgery

## 2011-09-02 ENCOUNTER — Ambulatory Visit (INDEPENDENT_AMBULATORY_CARE_PROVIDER_SITE_OTHER): Payer: PRIVATE HEALTH INSURANCE | Admitting: Surgery

## 2011-09-02 VITALS — BP 116/70 | HR 64 | Temp 97.6°F | Resp 14 | Ht 63.0 in | Wt 178.4 lb

## 2011-09-02 DIAGNOSIS — Z9884 Bariatric surgery status: Secondary | ICD-10-CM

## 2011-09-02 NOTE — Progress Notes (Signed)
Patricia Carr is almost 6 months out from her Roux-en-Y gastric bypass. She recently had laboratory drawn by her physician is Asst. Patricia Carr and this lab I reviewed it appeared pretty normal. Her potassium was 3.4 but she is not planning of any issues that I would say are related. She has a lot of energy and looks great. So far she has lost 28% of her excess weight which amounts to some 70 pounds. Today his BMI is 31. I'm very proud of her progress and want to see her back after one-year anniversary in May 2013. She is really not having any symptoms or complaints and has done very well. She is off her CPAP and her hypertensive medications have been cut in half.  Impression doing well plan return May 2011

## 2011-09-29 ENCOUNTER — Encounter: Payer: Self-pay | Admitting: *Deleted

## 2011-09-29 ENCOUNTER — Encounter: Payer: PRIVATE HEALTH INSURANCE | Attending: Surgery | Admitting: *Deleted

## 2011-09-29 DIAGNOSIS — Z9884 Bariatric surgery status: Secondary | ICD-10-CM | POA: Insufficient documentation

## 2011-09-29 DIAGNOSIS — Z09 Encounter for follow-up examination after completed treatment for conditions other than malignant neoplasm: Secondary | ICD-10-CM | POA: Insufficient documentation

## 2011-09-29 DIAGNOSIS — Z713 Dietary counseling and surveillance: Secondary | ICD-10-CM | POA: Insufficient documentation

## 2011-09-29 NOTE — Patient Instructions (Signed)
Goals:  Follow Phase IV: High Protein, Low Carb Diet  Eat 3-6 small meals/snacks, every 3-5 hrs  Continue lean protein foods to meet 60-80g goal  Continue fluid intake to 64oz +  Add 15 grams of carbohydrate (fruit, whole grain, starchy vegetable) with meals  Avoid drinking 15 minutes before, during and 30 minutes after eating  Aim for >30 min of physical activity daily

## 2011-09-29 NOTE — Progress Notes (Signed)
  Follow-up visit: 6 Month Post-Operative Gastric Bypass Surgery   Medical Nutrition Therapy: Appt start time: 1500 end time: 1530.   Assessment: Primary concerns today: post-operative bariatric surgery nutrition management.   Weight today: 176.6 lbs Weight change: 15.6lbs  Total weight lost: 79.1 lbs total  BMI: 31.1% Weight goal: 140lbs   Start wt @ [facility]: 255.5 lbs Surgery date: 03/14/11  24-hr recall:  B (6-7 AM): Boiled egg, 1 sausage patty OR yogurt cup OR 1 egg omlet  Snk (10 AM): 1 oz cheese  L (12-1 PM): 3 oz protein, 1/4 cup vegetable OR Chili w/ ground beef/beans Snk (3-4 PM): 1/2 cup fruit OR Protein bar D (6-8 PM): 3 oz protein, 1 small piece of bread Snk (8-9 PM): n/a  Fluid intake: water = 40-50 oz  Estimated total protein intake: 50-60 oz   Medications: Back on CPAP Supplements: Liquid mvi, calcium, biotin; Off B12 per PCP  Using straws: No  Drinking while eating: No  Hair loss: None reported  Carbonated beverages: No  N/V/D/C: Regurgitation occasionally with "tough" pieces of meat Dumping syndrome: None reported  Recent physical activity: Walking some yet no structured exercise.   Progress Towards Goal(s): In progress.   Handouts given during visit include:  Potassium-rich food list  Nutritional Diagnosis:  NI-5.7.1 Inadequate protein intake As related to small portions of protein rich foods. As evidenced by pt meeting <75% estimated protein requirements.   Intervention: Nutrition education.   Monitoring/Evaluation: Dietary intake, exercise, protein intake, and body weight. Follow up in 6 months for 12 month post-op visit.

## 2011-10-24 ENCOUNTER — Encounter (INDEPENDENT_AMBULATORY_CARE_PROVIDER_SITE_OTHER): Payer: Self-pay | Admitting: Surgery

## 2012-02-23 ENCOUNTER — Encounter: Payer: PRIVATE HEALTH INSURANCE | Attending: Surgery | Admitting: *Deleted

## 2012-02-23 ENCOUNTER — Encounter: Payer: Self-pay | Admitting: *Deleted

## 2012-02-23 VITALS — Ht 63.0 in | Wt 162.0 lb

## 2012-02-23 DIAGNOSIS — Z09 Encounter for follow-up examination after completed treatment for conditions other than malignant neoplasm: Secondary | ICD-10-CM | POA: Insufficient documentation

## 2012-02-23 DIAGNOSIS — Z713 Dietary counseling and surveillance: Secondary | ICD-10-CM | POA: Insufficient documentation

## 2012-02-23 DIAGNOSIS — Z9884 Bariatric surgery status: Secondary | ICD-10-CM

## 2012-02-23 NOTE — Patient Instructions (Signed)
Goals:  Continue to follow Phase IV: High Protein, Low Carb Diet  Eat 3-6 small meals/snacks, every 3-5 hrs  Continue lean protein foods to meet 60-80g goal  Increase fluid intake to 64oz +  Add 15 grams of carbohydrate (fruit, whole grain, starchy vegetable) with meals  Avoid drinking 15 minutes before, during and 30 minutes after eating  Aim for >30 min of physical activity daily

## 2012-02-23 NOTE — Progress Notes (Signed)
Follow-up visit: 11 Month Post-Operative Gastric Bypass Surgery   Medical Nutrition Therapy: Appt start time: 1500   End time: 1530.   Assessment: Primary concerns today: post-operative bariatric surgery nutrition management.  Patricia Carr returns today for f/u with an additional wt loss of 14.6 lbs. Reports doing well with no problems overall. Reports decrease in fluids and mild cramping in legs. Still on HCTZ and trying to eat more foods with K+. Likely dehydrated. Discussed ways to increase fluid.  Start wt @ [facility]: 255.5 lbs Surgery date: 03/14/11  Weight today: 162.0 lbs Weight change: 14.6 lbs  Total weight lost: 93.5 lbs total  BMI: 28.7 Weight goal: 140 lbs   TANITA  BODY COMP RESULTS  02/23/12   %Fat 28.0%   FM (lbs) 45.5    FFM (lbs) 116.5   TBW (lbs) 85.5    Fluid intake: water = 30-50 oz  Estimated total protein intake: 50-80 g ("depends on the day")   Medications: No changes reported Supplements: Liquid mvi, calcium (sometimes forgets), biotin; Off B12 per Dr. Daphine Deutscher  Using straws: No  Drinking while eating: No  Hair loss: None reported  Carbonated beverages: No  N/V/D/C: Nausea recently - unsure of cause; no changes to diet reported. Dumping syndrome: None reported  Recent physical activity: Walking a few times a week; no elevators, parks further away from door, plays with grandchildren - "I feel like I'm 50% more active than I've ever been"  Progress Towards Goal(s): In progress.   Samples given during visit include:   Bariatric Advantage Calcium Crystals (unflavored): 1 pkt Lot # [id number]; Exp: 03/14  Nutritional Diagnosis:  NI-5.7.1 Inadequate protein intake related to small portions of protein rich foods. As evidenced by pt meeting <75% estimated protein requirements.   Intervention: Nutrition education.   Monitoring/Evaluation: Dietary intake, exercise, protein intake, and body weight. Follow up in 6 months for 18 month post-op visit.

## 2012-04-02 ENCOUNTER — Encounter (INDEPENDENT_AMBULATORY_CARE_PROVIDER_SITE_OTHER): Payer: Self-pay | Admitting: Surgery

## 2012-04-04 ENCOUNTER — Ambulatory Visit (INDEPENDENT_AMBULATORY_CARE_PROVIDER_SITE_OTHER): Payer: PRIVATE HEALTH INSURANCE | Admitting: Surgery

## 2012-04-04 DIAGNOSIS — Z9884 Bariatric surgery status: Secondary | ICD-10-CM

## 2012-04-04 NOTE — Patient Instructions (Signed)
Diet Following Bariatric Surgery The bariatric diet is designed to provide fluids and nourishment while promoting weight loss after bariatric surgery. The diet is divided into 3 stages. The rate of progression varies based on individual food tolerance. DIET FOLLOWING BARIATRIC SURGERY The diet following surgery is divided into 3 stages to allow a gradual adjustment. It is very important to the success of your surgery to:  Progress to each stage slowly.   Eat at set times.   Chew food well and stop eating when you are full.   Not drink liquids 30 minutes before and after meals.  If you feel tightness or pressure in your chest, that means you are full. Wait 30 minutes before you try to eat again. STAGE 1 BARIATRIC DIET - ABOUT 2 WEEKS IN DURATION   The diet begins the day of surgery. It will last about 1 to 2 weeks after surgery. Your surgeon may have individual guidelines for you about specific foods or the progression of your diet. Follow your surgeon's guidelines.   If clear liquids are well-tolerated without vomiting, your caregiver will add a 4 oz to 6 oz high protein, low-calorie liquid supplement. You could add this to your meal plan 3 times daily. You will need at least 60 g to 80 g of protein daily or as determined by your Registered Dietitian.   Guidelines for choosing a protein supplement include:   At least 15 g of protein per 8 oz serving.   Less than 20 g total carbohydrate per 8 oz serving.   Less than 5 g fat per 8 oz serving.   Avoid carbonated beverages, caffeine, alcohol, and concentrated sweets such as sugar, cakes, and cookies.   Right after surgery, you may only be able to eat 3 to 4 tsp per meal. Your maximum volume should not exceed  to  cup total. Do not eat or drink more than 1 oz or 2 tbs every 15 minutes.   Take a chewable multivitamin and mineral supplement.   Drink at least 48 oz of fluid daily, which includes your protein supplement.  Food and  beverages from the list below are allowed at set times (for example at 8 AM, 12 noon, or 5 PM):  Decaffeinated coffee or tea.   100% fruit juice.   Diet or sugar-free drinks.   Broth.   Blenderized soup.   Skim milk or lactose-free milk.   Sugar-free gelatin dessert or frozen ice pops.   Mashed potatoes.   Yogurt (artificially sweetened).   Sugar-free pudding.   Blended low-fat cottage cheese.   Unsweetened applesauce, grits, or hot wheat cereal.  Four to six ounces of a liquid protein supplement from the list below is recommended for snacks at 10 AM, 2 PM, and 8 PM.  STAGE 2 BARIATRIC DIET (SOFT DIET) - ABOUT 4 WEEKS IN DURATION  About 2 weeks after surgery, your caregiver will progress your diet to this stage. Foods may need to be blended to the consistency of applesauce. Choose low-fat foods (less than 5 g of fat per serving) and avoid concentrated sweets and sugar (less than 10 g of sugar per serving). Meals should not exceed  to  cup total. This stage will last about 4 weeks. It is recommended that you meet with your dietitian at this stage to begin preparation for the last stage. This stage consists of 3 meals a day with a liquid protein supplement between meals twice daily. Do not drink liquids with foods. You must   wait 30 minutes for the stomach pouch to empty before drinking. Chew food well. The food must be almost liquified before swallowing. Soft foods from the list below can now be slowly added to your diet:  Soft fruit (soft canned fruit in light syrup or natural juice, banana, melon, peaches, pears, or strawberries).   Cooked vegetables.   Toast or crackers (becomes soft after chewing 20 times).   Hot wheat cereal.   Fish.   Eggs (scrambled, soft-boiled).  STAGE 3 BARIATRIC DIET (REGULAR DIET) - ABOUT 6 to 8 WEEKS AFTER SURGERY About 6 to 8 weeks after surgery, you will be advanced to food that is regular in texture. This diet should include all food groups.  The diet will continue to promote weight loss. Meals should not exceed  to 1 cup total. Your dietitian will be available to assist you in meal planning and additional behavioral strategies to make this final stage a long-term success. Slowly add foods of regular consistency and remember:  Eat only at your chosen meal times.   Minimize drinking with meals. You should drink 30 minutes before eating. Do not start drinking again for about 2 hours after eating.   Chew food well. Take small bites.   Think about the portion size of a healthy frozen meal. You will be able to eat most of this.   Make sure your meal is balanced with starch, protein, fruits, and vegetables.   When you feel full, stop eating.  Document Released: 04/23/2003 Document Revised: 10/06/2011 Document Reviewed: 01/14/2011 [employer] Patient Information 2012 [employer]. 

## 2012-04-04 NOTE — Progress Notes (Signed)
Patricia Carr 56 y.o.  There is no height or weight on file to calculate BMI.  Patient Active Problem List  Diagnoses  . GERD  . NAUSEA WITH VOMITING  . DYSPHAGIA UNSPECIFIED  . HTN (hypertension)  . Hx of gastric bypass    Allergies  Allergen Reactions  . Iodine     REACTION: rash    Past Surgical History  Procedure Date  . Tubal ligation 1985  . Colonoscopy    JONES, ANGIE L, NT, Nursing/ Tech I No diagnosis found.  Patricia Carr is almost 13 months postop lap Roux-en-Y gastric bypass. She has lost 37% of her excess weight or 91 pounds. She looks great. Her sleep apnea has resolved. I suspect her high blood pressure has resolved also but Dr. Charm Barges hasn't taken her off medicines yet. She also reports no GERD. I very pleased at how she has done it continues to do. I will see her back in followup in one year. She will be seeing Dr. Charm Barges next week and will have her labs drawn at that time. Matt B. Daphine Deutscher, MD, [facility] [phone] beeper [phone]  04/04/2012 4:21 PM

## 2012-08-23 ENCOUNTER — Ambulatory Visit: Payer: PRIVATE HEALTH INSURANCE | Admitting: *Deleted

## 2012-12-03 IMAGING — RF DG UGI W/ GASTROGRAFIN
10 series · 10 of 10 positions shown · non-contrast
Comparison: None.

CLINICAL DATA: 1 day status post bariatric gastric bypass surgery.
Evaluate for anastomotic leak or obstruction.

WATER SOLUBLE UPPER GI SERIES
TECHNIQUE: Single-column upper GI series was performed using water
soluble contrast.
Fluoroscopy Time: 1.8 minutes

[Series 1: run · 1 of 1 slices shown (1 of 10)]
[im 1/1]
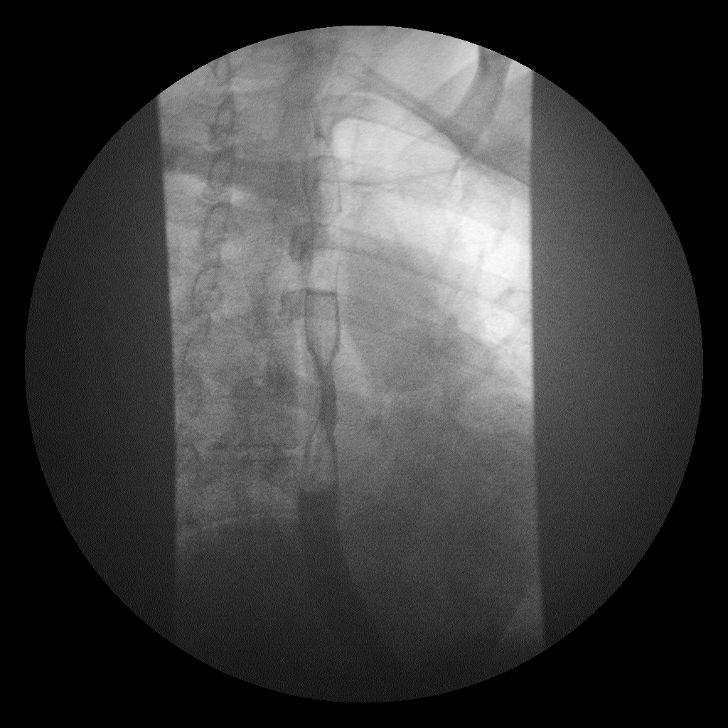

[Series 2: run · 1 of 1 slices shown (2 of 10)]
[im 1/1]
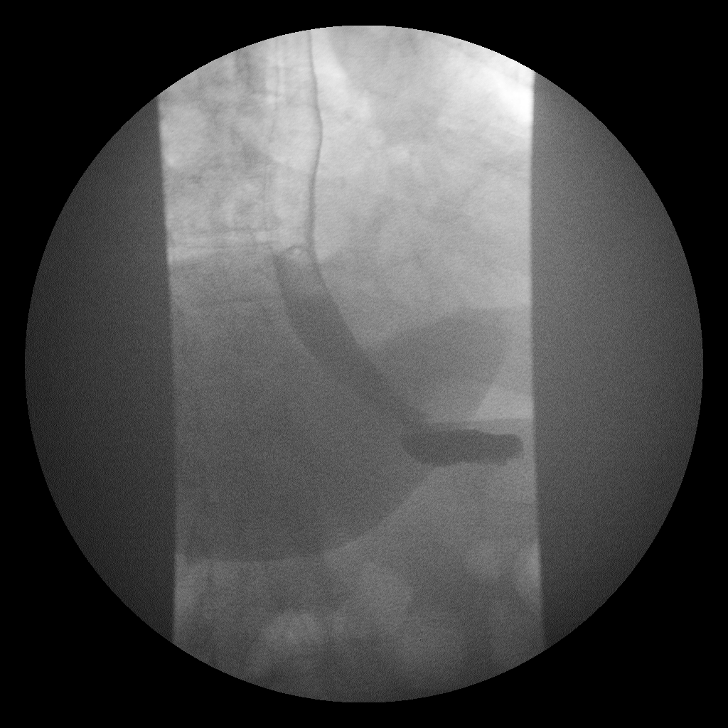

[Series 3: run · 1 of 1 slices shown (3 of 10)]
[im 1/1]
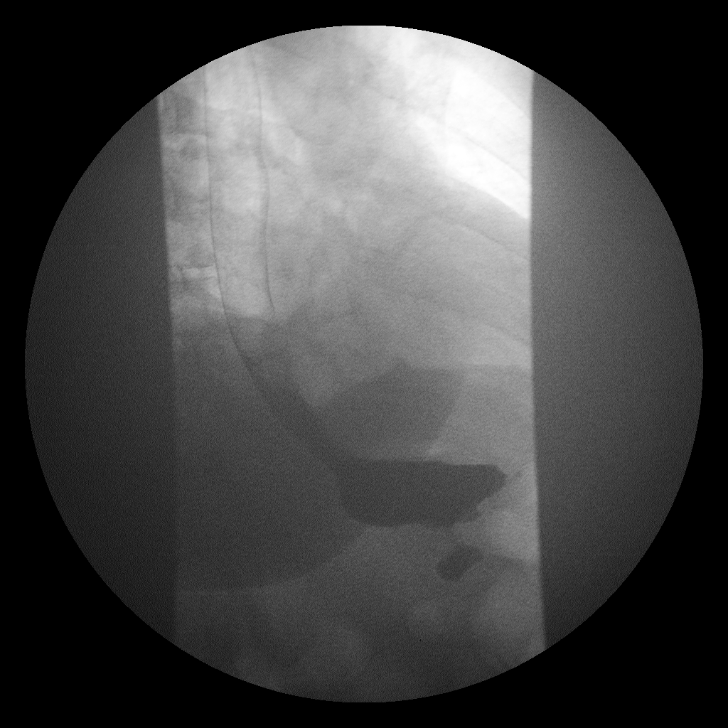

[Series 4: run · 1 of 1 slices shown (4 of 10)]
[im 1/1]
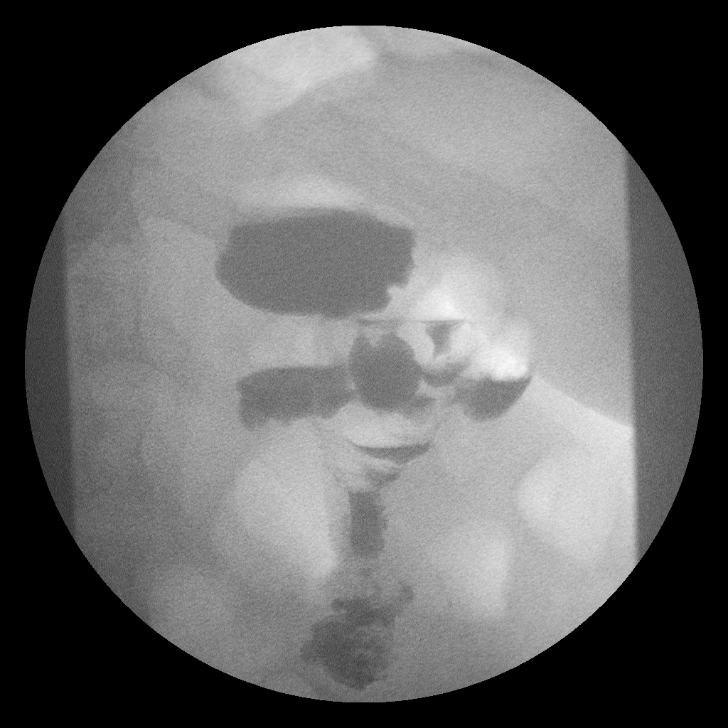

[Series 5: run · 1 of 1 slices shown (5 of 10)]
[im 1/1]
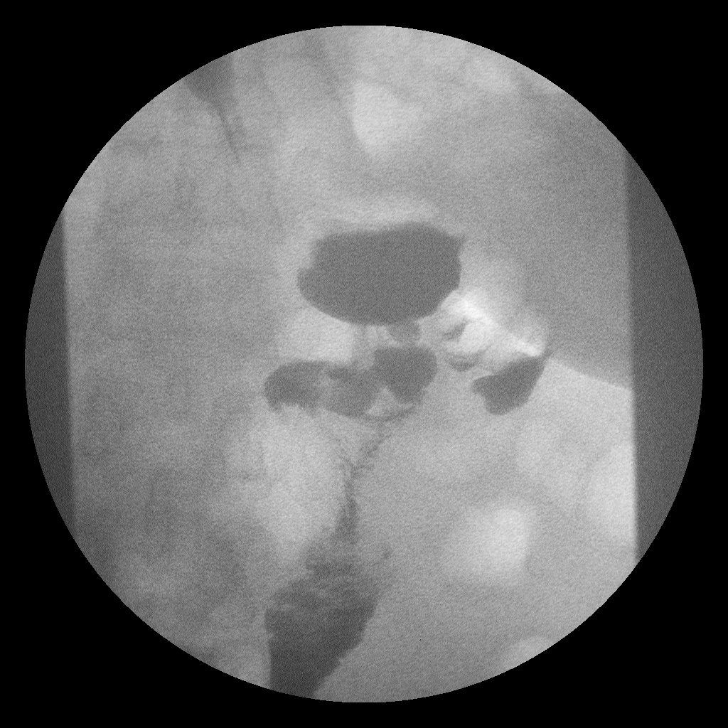

[Series 6: run · 1 of 1 slices shown (6 of 10)]
[im 1/1]
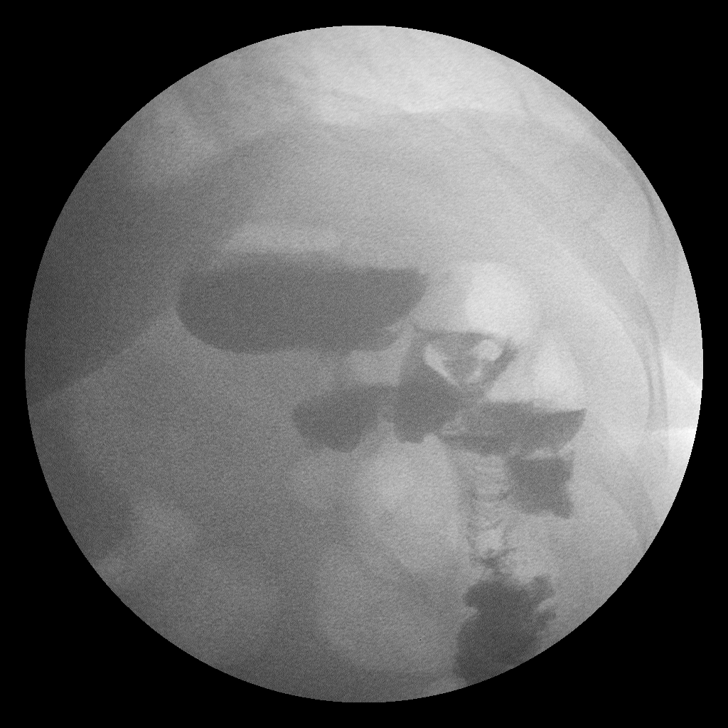

[Series 7: run · 1 of 1 slices shown (7 of 10)]
[im 1/1]
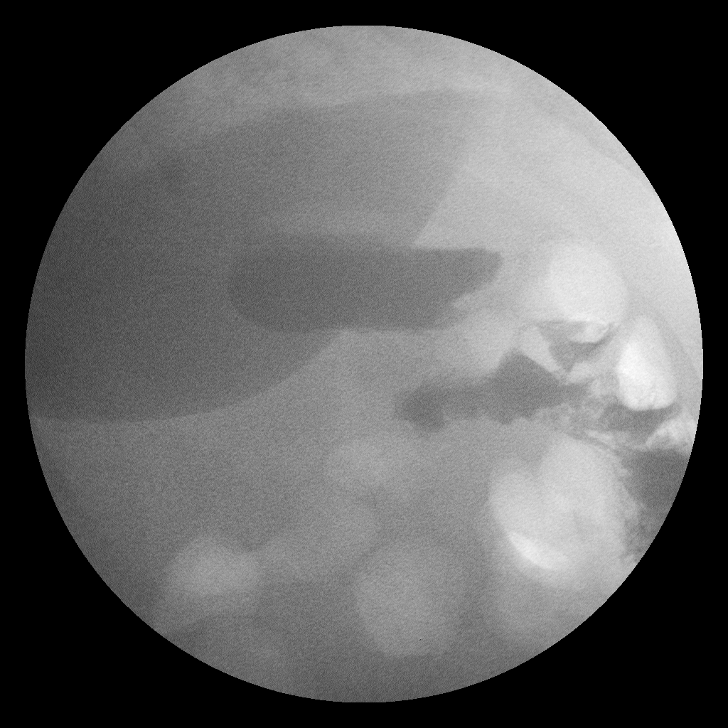

[Series 8: run · 1 of 1 slices shown (8 of 10)]
[im 1/1]
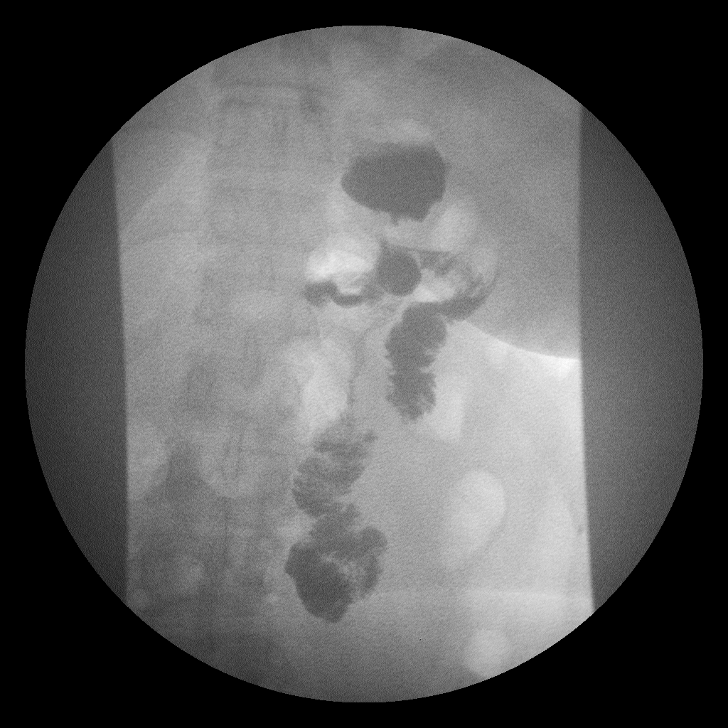

[Series 9: run · 1 of 1 slices shown (9 of 10)]
[im 1/1]
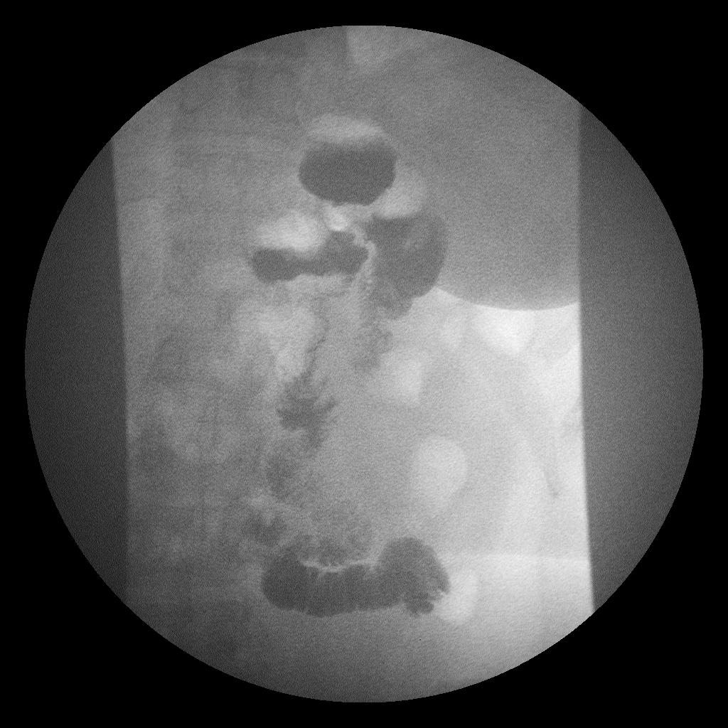

[Series 10: run · 1 of 1 slices shown (10 of 10)]
[im 1/1]
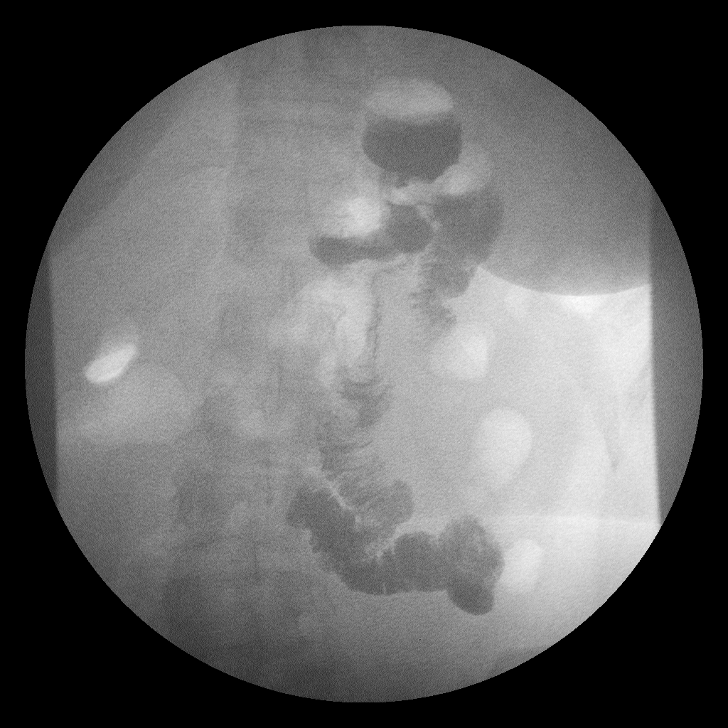

[10 of 10 positions shown; findings below may reference images not displayed]

FINDINGS: The scout radiograph shows no evidence of dilated bowel
loops.

The patient drank 50 ml water-soluble contrast was slowly without
difficulty.  There is no evidence of esophageal stricture or
obstruction.  The gastric pouch has a normal appearance.  There is
no evidence of contrast leak or extravasation.

Prompt emptying of contrast into the efferent limb of small bowel
is seen, without evidence of an anastomotic leak or obstruction.
The efferent limb is nondilated, with free passage of contrast into
distal small bowel loops, without evidence of contrast leak or
extravasation at the expected anastomotic site of the afferent
limb.
IMPRESSION: Normal postoperative appearance status post gastric bypass surgery.
No evidence of anastomotic leak or obstruction.

## 2013-04-10 ENCOUNTER — Encounter (INDEPENDENT_AMBULATORY_CARE_PROVIDER_SITE_OTHER): Payer: Self-pay | Admitting: Surgery

## 2013-04-10 ENCOUNTER — Ambulatory Visit (INDEPENDENT_AMBULATORY_CARE_PROVIDER_SITE_OTHER): Payer: PRIVATE HEALTH INSURANCE | Admitting: Surgery

## 2013-04-10 VITALS — BP 142/78 | HR 74 | Temp 97.5°F | Resp 16 | Ht 63.0 in | Wt 152.2 lb

## 2013-04-10 DIAGNOSIS — Z9884 Bariatric surgery status: Secondary | ICD-10-CM

## 2013-04-10 NOTE — Patient Instructions (Signed)
Thanks for your patience.  If you need further assistance after leaving the office, please call our office and speak with a [facility] nurse.  [phone].  If you want to leave a message for Dr. Reginae Wolfrey, please call his office phone at [phone]. 

## 2013-04-10 NOTE — Progress Notes (Signed)
Patricia Carr 57 y.o.  Body mass index is 26.97 kg/(m^2).  Patient Active Problem List   Diagnosis Date Noted  . HTN (hypertension) 05/10/2011  . 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 GERD 12/17/2009  . NAUSEA WITH VOMITING 12/17/2009  . DYSPHAGIA UNSPECIFIED 12/17/2009    Allergies  Allergen Reactions  . Iodine     REACTION: rash    Past Surgical History  Procedure Laterality Date  . Tubal ligation  1985  . Colonoscopy     JONES, ANGIE L, NT No diagnosis found.  Two years postop Roux en Y gastric bypass.  Her OSA has completely resolved, her GER is resolved, her hypertension is resolved.  She looks great.  Matt B. Daphine Deutscher, MD, [facility] [phone] beeper [phone]  04/10/2013 3:32 PM

## 2013-07-30 ENCOUNTER — Encounter (INDEPENDENT_AMBULATORY_CARE_PROVIDER_SITE_OTHER): Payer: Self-pay

## 2013-11-13 ENCOUNTER — Telehealth (INDEPENDENT_AMBULATORY_CARE_PROVIDER_SITE_OTHER): Payer: Self-pay | Admitting: General Surgery

## 2013-11-13 NOTE — Telephone Encounter (Signed)
Pt called from her work to report a new, constant pain in her abdomen.  It is intermittently a sharp pain, "like a knife" that she doubles-over with until it eases up.  The pain is above her umbilicus.  She has tried simethicone and OTC Zantac without any change in the pain.  Her bowels are moving normally.  Pt was told by a family member that what she describes is similar to gallbladder attack, so pt does not know what to do and would like Dr. Earlie Server opinion.  She will be at work [phone]) until 5:30 pm, then on her cell ([phone].)  Pt advised to go to [facility] ED for unresolved pain.  She understands and will wait to hear from Dr. Hassell Done on his recommendation.

## 2013-11-14 ENCOUNTER — Telehealth (INDEPENDENT_AMBULATORY_CARE_PROVIDER_SITE_OTHER): Payer: Self-pay | Admitting: General Surgery

## 2013-11-14 ENCOUNTER — Other Ambulatory Visit (INDEPENDENT_AMBULATORY_CARE_PROVIDER_SITE_OTHER): Payer: Self-pay | Admitting: General Surgery

## 2013-11-14 MED ORDER — PANTOPRAZOLE SODIUM 20 MG PO TBEC: 20.0000 mg | DELAYED_RELEASE_TABLET | Freq: Every day | ORAL | Status: DC

## 2013-11-14 MED ORDER — SUCRALFATE 1 GM/10ML PO SUSP: 1.0000 g | Freq: Two times a day (BID) | ORAL | Status: DC

## 2013-11-14 NOTE — Telephone Encounter (Signed)
Pt called, as she did not hear from Dr. Hassell Done yesterday.  Dr. Hassell Done in clinic today, and addressed her concern for pain.  Ordered Carafate slurry BID x 14 days and Pantoprazole 20 mg QD.  Appt made for follow-up on 12/25/13.  Pt understands to call back for worsening change in symptoms, or go to ED at [facility].

## 2013-12-25 ENCOUNTER — Ambulatory Visit (INDEPENDENT_AMBULATORY_CARE_PROVIDER_SITE_OTHER): Payer: PRIVATE HEALTH INSURANCE | Admitting: Surgery

## 2016-08-05 ENCOUNTER — Encounter (HOSPITAL_COMMUNITY): Payer: Self-pay

## 2016-09-13 ENCOUNTER — Other Ambulatory Visit: Payer: Self-pay | Admitting: Physician Assistant

## 2016-09-14 NOTE — Telephone Encounter (Signed)
Pt hasn't been seen here in our office, ok to refill?

## 2017-10-11 ENCOUNTER — Encounter (HOSPITAL_COMMUNITY): Payer: Self-pay

## 2018-02-15 ENCOUNTER — Encounter: Payer: Self-pay | Admitting: Physical Therapy

## 2018-02-15 ENCOUNTER — Other Ambulatory Visit: Payer: Self-pay

## 2018-02-15 ENCOUNTER — Ambulatory Visit: Payer: Managed Care, Other (non HMO) | Attending: Orthopedic Surgery | Admitting: Physical Therapy

## 2018-02-15 DIAGNOSIS — R600 Localized edema: Secondary | ICD-10-CM

## 2018-02-15 DIAGNOSIS — M25661 Stiffness of right knee, not elsewhere classified: Secondary | ICD-10-CM | POA: Diagnosis present

## 2018-02-15 DIAGNOSIS — M6281 Muscle weakness (generalized): Secondary | ICD-10-CM | POA: Diagnosis present

## 2018-02-15 DIAGNOSIS — G8929 Other chronic pain: Secondary | ICD-10-CM

## 2018-02-15 DIAGNOSIS — M25561 Pain in right knee: Secondary | ICD-10-CM | POA: Insufficient documentation

## 2018-02-15 NOTE — Therapy (Deleted)
[facility] [place], [place], [address] Phone: [phone]   Fax:  [phone]  Patient Details  Name: Patricia Carr MRN: [id number] Date of Birth: 1956-06-08 Referring Provider:  Gaynelle Arabian, MD  Encounter Date: 02/15/2018   Yon Schiffman, [place] 02/15/2018, 3:15 PM  [facility] [address] [place], [place], [address] Phone: [phone]   Fax:  [phone]

## 2018-02-15 NOTE — Therapy (Signed)
[facility] [place], [place], [address] Phone: [phone]   Fax:  [phone]  Physical Therapy Evaluation  Patient Details  Name: Patricia Carr MRN: [id number] Date of Birth: [birth year] Referring Provider: Gaynelle Arabian MD.   Encounter Date: 02/15/2018  PT End of Session - 02/15/18 1447    Visit Number  1    Number of Visits  12    Date for PT Re-Evaluation  03/29/18    PT Start Time  0226    PT Stop Time  0311    PT Time Calculation (min)  45 min    Activity Tolerance  Patient tolerated treatment well    Behavior During Therapy  [facility] for tasks assessed/performed       Past Medical History:  Diagnosis Date  . Anemia   . Bronchitis   . GERD (gastroesophageal reflux disease)   . Hypertension   . Joint pain   . Sinus complaint   . Sleep apnea   . Wears glasses     Past Surgical History:  Procedure Laterality Date  . COLONOSCOPY    . TUBAL LIGATION  1985    There were no vitals filed for this visit.   Subjective Assessment - 02/15/18 1450    Subjective  The patient is s/p right total knee replacement.  She is compliant to her HEP and TED hose usage.  She is walking without an assistive device today.  Her current pain-level is a 7/10.  Walking increases pain.   Rest and ise decrease pain.      Pertinent History  Chronic knee pain.    How long can you walk comfortably?  Short distances within house.    Patient Stated Goals  Get out of pain and walk for exercise.    Currently in Pain?  Yes    Pain Score  7     Pain Location  Knee    Pain Orientation  Right    Pain Descriptors / Indicators  Aching    Pain Type  Surgical pain    Pain Onset  In the past 7 days    Pain Frequency  Constant    Aggravating Factors   See above.    Pain Relieving Factors  See above.         [facility] PT Assessment - 02/15/18 0001      Assessment   Medical Diagnosis  Right total knee replacement.    Referring Provider  Gaynelle Arabian MD.    Onset Date/Surgical Date  -- 02/13/18 (surgery date).      Precautions   Precautions  -- No ultrasound.      Restrictions   Weight Bearing Restrictions  No      Balance Screen   Has the patient fallen in the past 6 months  Yes    How many times?  -- 1.    Has the patient had a decrease in activity level because of a fear of falling?   Yes    Is the patient reluctant to leave their home because of a fear of falling?   No      Home Environment   Living Environment  Private residence      Prior Function   Level of Independence  Independent      Observation/Other Assessments   Observations  Right knee with Aquacel intact and TED hose.    Focus on Therapeutic Outcomes (FOTO)   82% limitation.  Observation/Other Assessments-Edema    Edema  Circumferential      Circumferential Edema   Circumferential - Right  RT 4 cms > than left at mid-patellar position.      ROM / Strength   AROM / PROM / Strength  AROM;Strength      AROM   Overall AROM Comments  -15 degrees of right knee extension in supine and -10 degrees with active flexion to 85 degrees and passive to 90 degrees.      Strength   Overall Strength Comments  Right hip flexion= 3 to 3+/5 and right knee graded at 4/5 today.      Palpation   Palpation comment  Some mild anterior righ tknee pain diffusely.      Ambulation/Gait   Gait Comments  The patient's is ambulating without an assisitive device.  her gait is currently very antalgic with decreased stance time over her right LE.                Objective measurements completed on examination: See above findings.      [facility] PT Treatment/Exercise - 02/15/18 0001      Modalities   Modalities  Vasopneumatic      Vasopneumatic   Number Minutes Vasopneumatic   10 minutes    Vasopnuematic Location   -- Right knee.    Vasopneumatic Pressure  Medium      Manual Therapy   Manual Therapy  Passive ROM    Passive ROM  In supine:  PROM with  low load long duration stretching x 8 minutes into flexion and extension.                  PT Long Term Goals - 02/15/18 1511      PT LONG TERM GOAL #1   Title  Ind with a HEP.    Time  4    Period  Weeks    Status  New      PT LONG TERM GOAL #2   Title  Full right active knee extension in order to normalize gait.      PT LONG TERM GOAL #3   Title  Active knee flexion to 115 to 120 degrees+ so the patient can perform functional tasks and do so with pain not > 2-3/10.    Time  4    Period  Weeks    Status  New      PT LONG TERM GOAL #4   Title  Increase right knee strength to a solid 4+/5 to provide good stability for accomplishment of functional activities.    Time  4    Period  Weeks    Status  New      PT LONG TERM GOAL #5   Title  Perform a reciprocating stair gait with one railing with pain not > 2-3/10.    Time  4    Period  Weeks    Status  New             Plan - 02/15/18 1508    Clinical Impression Statement  The patient prsents to OPPT s/p right total knee replacement 2 days ago.  She has an expected loss of range of motion currently.  Her knee is swollen within normal ranges.  She is compliant to her HEP. and wearing her TED hose.  The patient pain and deficits impair her functional mobility currently.  The patient will benefit from skilled physical therapy intervention.    Clinical Presentation  Stable    Clinical Presentation due to:  Excellent surgical outcome.    Clinical Decision Making  Low    Rehab Potential  Excellent    PT Frequency  3x / week    PT Duration  4 weeks    PT Treatment/Interventions  ADLs/Self Care Home Management;Cryotherapy;Occupational psychologist;Therapeutic activities;Therapeutic exercise;Neuromuscular re-education;Patient/family education;Passive range of motion;Manual techniques;Vasopneumatic Device    PT Next Visit Plan  TKA protocol.  Vaso and e' stim.    Consulted and Agree with Plan of  Care  Patient       Patient will benefit from skilled therapeutic intervention in order to improve the following deficits and impairments:  Abnormal gait, Decreased activity tolerance, Decreased range of motion, Decreased strength, Increased edema, Pain  Visit Diagnosis: Chronic pain of right knee - Plan: PT plan of care cert/re-cert  Stiffness of right knee, not elsewhere classified - Plan: PT plan of care cert/re-cert  Muscle weakness (generalized) - Plan: PT plan of care cert/re-cert  Localized edema - Plan: PT plan of care cert/re-cert     Problem List Patient Active Problem List   Diagnosis Date Noted  . HTN (hypertension) 05/10/2011  . 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 GERD 12/17/2009  . NAUSEA WITH VOMITING 12/17/2009  . DYSPHAGIA UNSPECIFIED 12/17/2009    Markeis Allman, [place] MPT 02/15/2018, 3:15 PM  [facility] [address] [place], [place], [address] Phone: [phone]   Fax:  [phone]  Name: Patricia Carr MRN: [id number] Date of Birth: [birth year]

## 2018-02-19 ENCOUNTER — Ambulatory Visit: Payer: Managed Care, Other (non HMO) | Admitting: Physical Therapy

## 2018-02-19 ENCOUNTER — Encounter: Payer: Self-pay | Admitting: Physical Therapy

## 2018-02-19 DIAGNOSIS — M25561 Pain in right knee: Principal | ICD-10-CM

## 2018-02-19 DIAGNOSIS — G8929 Other chronic pain: Secondary | ICD-10-CM

## 2018-02-19 DIAGNOSIS — M6281 Muscle weakness (generalized): Secondary | ICD-10-CM

## 2018-02-19 DIAGNOSIS — R600 Localized edema: Secondary | ICD-10-CM

## 2018-02-19 DIAGNOSIS — M25661 Stiffness of right knee, not elsewhere classified: Secondary | ICD-10-CM

## 2018-02-19 NOTE — Therapy (Signed)
[facility] [place], [place], [address] Phone: [phone]   Fax:  [phone]  Physical Therapy Treatment  Patient Details  Name: Patricia Carr MRN: [id number] Date of Birth: May 21, 1956 Referring Provider: Gaynelle Arabian MD.   Encounter Date: 02/19/2018  PT End of Session - 02/19/18 1523    Visit Number  2    Number of Visits  12    Date for PT Re-Evaluation  03/29/18    PT Start Time  1520    PT Stop Time  1614    PT Time Calculation (min)  54 min    Activity Tolerance  Patient tolerated treatment well    Behavior During Therapy  [facility] for tasks assessed/performed       Past Medical History:  Diagnosis Date  . Anemia   . Bronchitis   . GERD (gastroesophageal reflux disease)   . Hypertension   . Joint pain   . Sinus complaint   . Sleep apnea   . Wears glasses     Past Surgical History:  Procedure Laterality Date  . COLONOSCOPY    . TUBAL LIGATION  1985    There were no vitals filed for this visit.  Subjective Assessment - 02/19/18 1521    Subjective  Reports that her knee hurts.    Pertinent History  Chronic knee pain.    How long can you walk comfortably?  Short distances within house.    Patient Stated Goals  Get out of pain and walk for exercise.    Currently in Pain?  Yes    Pain Score  6     Pain Location  Knee    Pain Orientation  Right    Pain Descriptors / Indicators  Sore;Stabbing;Burning    Pain Type  Surgical pain    Pain Onset  In the past 7 days    Pain Frequency  Constant         [facility] PT Assessment - 02/19/18 0001      Assessment   Medical Diagnosis  Right total knee replacement.    Onset Date/Surgical Date  02/13/18    Next MD Visit  02/27/2018      Restrictions   Weight Bearing Restrictions  No                   [facility] Adult PT Treatment/Exercise - 02/19/18 0001      Exercises   Exercises  Knee/Hip      Knee/Hip Exercises: Stretches   Passive Hamstring  Stretch  Right;3 reps;30 seconds      Knee/Hip Exercises: Aerobic   Nustep  L3, seat 9 x16 min      Knee/Hip Exercises: Standing   Hip Flexion  AROM;Right;15 reps;Knee bent    Forward Lunges  Right;2 sets;10 reps;3 seconds    Other Standing Knee Exercises  Tandem swing with R ankle DF x15 reps      Knee/Hip Exercises: Supine   Short Arc Quad Sets  Strengthening;Right;2 sets;10 reps      Modalities   Modalities  Vasopneumatic      Vasopneumatic   Number Minutes Vasopneumatic   15 minutes    Vasopnuematic Location   Knee    Vasopneumatic Pressure  Medium    Vasopneumatic Temperature   34      Manual Therapy   Manual Therapy  Passive ROM    Passive ROM  PROM of R knee into flexion, extension with holds at end range  PT Long Term Goals - 02/15/18 1511      PT LONG TERM GOAL #1   Title  Ind with a HEP.    Time  4    Period  Weeks    Status  New      PT LONG TERM GOAL #2   Title  Full right active knee extension in order to normalize gait.      PT LONG TERM GOAL #3   Title  Active knee flexion to 115 to 120 degrees+ so the patient can perform functional tasks and do so with pain not > 2-3/10.    Time  4    Period  Weeks    Status  New      PT LONG TERM GOAL #4   Title  Increase right knee strength to a solid 4+/5 to provide good stability for accomplishment of functional activities.    Time  4    Period  Weeks    Status  New      PT LONG TERM GOAL #5   Title  Perform a reciprocating stair gait with one railing with pain not > 2-3/10.    Time  4    Period  Weeks    Status  New            Plan - 02/19/18 1605    Clinical Impression Statement  Patient presented with B TED hose donned and mid level R knee pain. Patient guided through more ROM exercises with low level R quad strengthening. Ankle DF also encouraged to increased quad strengthening. Patient able tolerate PROM of R knee fairly well today. Normal modalities response noted  following removal of the modalities.    Rehab Potential  Excellent    PT Frequency  3x / week    PT Duration  4 weeks    PT Treatment/Interventions  ADLs/Self Care Home Management;Cryotherapy;Occupational psychologist;Therapeutic activities;Therapeutic exercise;Neuromuscular re-education;Patient/family education;Passive range of motion;Manual techniques;Vasopneumatic Device    PT Next Visit Plan  TKA protocol.  Vaso and e' stim.    Consulted and Agree with Plan of Care  Patient       Patient will benefit from skilled therapeutic intervention in order to improve the following deficits and impairments:  Abnormal gait, Decreased activity tolerance, Decreased range of motion, Decreased strength, Increased edema, Pain  Visit Diagnosis: Chronic pain of right knee  Stiffness of right knee, not elsewhere classified  Muscle weakness (generalized)  Localized edema     Problem List Patient Active Problem List   Diagnosis Date Noted  . HTN (hypertension) 05/10/2011  . 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 GERD 12/17/2009  . NAUSEA WITH VOMITING 12/17/2009  . DYSPHAGIA UNSPECIFIED 12/17/2009    Standley Brooking, PTA 02/19/2018, 4:20 PM  [facility] [address] [place], [place], [address] Phone: [phone]   Fax:  [phone]  Name: Patricia Carr MRN: [id number] Date of Birth: February 18, 1956

## 2018-02-21 ENCOUNTER — Encounter: Payer: Self-pay | Admitting: Physical Therapy

## 2018-02-21 ENCOUNTER — Ambulatory Visit: Payer: Managed Care, Other (non HMO) | Admitting: Physical Therapy

## 2018-02-21 DIAGNOSIS — M25561 Pain in right knee: Principal | ICD-10-CM

## 2018-02-21 DIAGNOSIS — M6281 Muscle weakness (generalized): Secondary | ICD-10-CM

## 2018-02-21 DIAGNOSIS — R600 Localized edema: Secondary | ICD-10-CM

## 2018-02-21 DIAGNOSIS — M25661 Stiffness of right knee, not elsewhere classified: Secondary | ICD-10-CM

## 2018-02-21 DIAGNOSIS — G8929 Other chronic pain: Secondary | ICD-10-CM

## 2018-02-21 NOTE — Therapy (Signed)
[facility] [place], [place], [address] Phone: [phone]   Fax:  [phone]  Physical Therapy Treatment  Patient Details  Name: LAQUITHA HESLIN MRN: [id number] Date of Birth: [birth year] Referring Provider: Gaynelle Arabian MD.   Encounter Date: 02/21/2018  PT End of Session - 02/21/18 1424    Visit Number  3    Number of Visits  12    Date for PT Re-Evaluation  03/29/18    PT Start Time  1425    PT Stop Time  1528    PT Time Calculation (min)  63 min    Activity Tolerance  Patient tolerated treatment well    Behavior During Therapy  [facility] for tasks assessed/performed       Past Medical History:  Diagnosis Date  . Anemia   . Bronchitis   . GERD (gastroesophageal reflux disease)   . Hypertension   . Joint pain   . Sinus complaint   . Sleep apnea   . Wears glasses     Past Surgical History:  Procedure Laterality Date  . COLONOSCOPY    . TUBAL LIGATION  1985    There were no vitals filed for this visit.  Subjective Assessment - 02/21/18 1424    Subjective  Reports that her knee feels very swollen.    Pertinent History  Chronic knee pain.    How long can you walk comfortably?  Short distances within house.    Patient Stated Goals  Get out of pain and walk for exercise.    Currently in Pain?  Other (Comment) No pain assessment provided         [facility] PT Assessment - 02/21/18 0001      Assessment   Medical Diagnosis  Right total knee replacement.    Onset Date/Surgical Date  02/13/18    Next MD Visit  02/27/2018      Restrictions   Weight Bearing Restrictions  No      ROM / Strength   AROM / PROM / Strength  AROM      AROM   Overall AROM   Deficits    AROM Assessment Site  Knee    Right/Left Knee  Right    Right Knee Flexion  96                   [facility] Adult PT Treatment/Exercise - 02/21/18 0001      Knee/Hip Exercises: Aerobic   Nustep  L3, seat 9 x15 min      Knee/Hip  Exercises: Standing   Hip Flexion  AROM;Right;15 reps;Knee bent    Forward Lunges  Right;2 sets;10 reps;3 seconds    Hip Abduction  AROM;Right;20 reps;Knee straight    Rocker Board  2 minutes      Knee/Hip Exercises: Supine   Short Arc Quad Sets  Strengthening;Right;2 sets;10 reps      Modalities   Modalities  Vasopneumatic      Vasopneumatic   Number Minutes Vasopneumatic   15 minutes    Vasopnuematic Location   Knee    Vasopneumatic Pressure  Low    Vasopneumatic Temperature   34      Manual Therapy   Manual Therapy  Passive ROM    Passive ROM  PROM of R knee into flexion, extension with holds at end range                  PT Long Term Goals - 02/15/18 1511  PT LONG TERM GOAL #1   Title  Ind with a HEP.    Time  4    Period  Weeks    Status  New      PT LONG TERM GOAL #2   Title  Full right active knee extension in order to normalize gait.      PT LONG TERM GOAL #3   Title  Active knee flexion to 115 to 120 degrees+ so the patient can perform functional tasks and do so with pain not > 2-3/10.    Time  4    Period  Weeks    Status  New      PT LONG TERM GOAL #4   Title  Increase right knee strength to a solid 4+/5 to provide good stability for accomplishment of functional activities.    Time  4    Period  Weeks    Status  New      PT LONG TERM GOAL #5   Title  Perform a reciprocating stair gait with one railing with pain not > 2-3/10.    Time  4    Period  Weeks    Status  New            Plan - 02/21/18 1535    Clinical Impression Statement  Patient continues to excel following R TKR. Patient able to complete exercises as directed with minimal VCs. Fairly good R quad activation noted with SAQ. Patient's R knee observed as more swollen today thus limited PROM and AROM measurements. AROM of R knee measured as 96 deg today. B TED hose donned today in clinic. Normal vasopnuematic response noted following removal of the modalities.    Rehab  Potential  Excellent    PT Frequency  3x / week    PT Duration  4 weeks    PT Treatment/Interventions  ADLs/Self Care Home Management;Cryotherapy;Occupational psychologist;Therapeutic activities;Therapeutic exercise;Neuromuscular re-education;Patient/family education;Passive range of motion;Manual techniques;Vasopneumatic Device    PT Next Visit Plan  TKA protocol.  Vaso and e' stim.    Consulted and Agree with Plan of Care  Patient       Patient will benefit from skilled therapeutic intervention in order to improve the following deficits and impairments:  Abnormal gait, Decreased activity tolerance, Decreased range of motion, Decreased strength, Increased edema, Pain  Visit Diagnosis: Chronic pain of right knee  Stiffness of right knee, not elsewhere classified  Muscle weakness (generalized)  Localized edema     Problem List Patient Active Problem List   Diagnosis Date Noted  . HTN (hypertension) 05/10/2011  . 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 GERD 12/17/2009  . NAUSEA WITH VOMITING 12/17/2009  . DYSPHAGIA UNSPECIFIED 12/17/2009    Standley Brooking, PTA 02/21/2018, 3:43 PM  [facility] [address] [place], [place], [address] Phone: [phone]   Fax:  [phone]  Name: ERSILIA BRAWLEY MRN: [id number] Date of Birth: 1956/06/19

## 2018-02-22 ENCOUNTER — Ambulatory Visit: Payer: Managed Care, Other (non HMO) | Admitting: Physical Therapy

## 2018-02-22 ENCOUNTER — Encounter: Payer: Self-pay | Admitting: Physical Therapy

## 2018-02-22 DIAGNOSIS — R600 Localized edema: Secondary | ICD-10-CM

## 2018-02-22 DIAGNOSIS — M25561 Pain in right knee: Principal | ICD-10-CM

## 2018-02-22 DIAGNOSIS — M6281 Muscle weakness (generalized): Secondary | ICD-10-CM

## 2018-02-22 DIAGNOSIS — M25661 Stiffness of right knee, not elsewhere classified: Secondary | ICD-10-CM

## 2018-02-22 DIAGNOSIS — G8929 Other chronic pain: Secondary | ICD-10-CM

## 2018-02-22 NOTE — Therapy (Signed)
[facility] [place], [place], [address] Phone: [phone]   Fax:  [phone]  Physical Therapy Treatment  Patient Details  Name: Patricia Carr MRN: [id number] Date of Birth: [birth year] Referring Provider: Gaynelle Arabian MD.   Encounter Date: 02/22/2018  PT End of Session - 02/22/18 1500    Visit Number  4    Number of Visits  12    Date for PT Re-Evaluation  03/29/18    PT Start Time  2355    PT Stop Time  1538    PT Time Calculation (min)  66 min    Activity Tolerance  Patient tolerated treatment well    Behavior During Therapy  [facility] for tasks assessed/performed       Past Medical History:  Diagnosis Date  . Anemia   . Bronchitis   . GERD (gastroesophageal reflux disease)   . Hypertension   . Joint pain   . Sinus complaint   . Sleep apnea   . Wears glasses     Past Surgical History:  Procedure Laterality Date  . COLONOSCOPY    . TUBAL LIGATION  1985    There were no vitals filed for this visit.  Subjective Assessment - 02/22/18 1448    Subjective  Reports that her knee has felt very swollen.    Pertinent History  Chronic knee pain.    How long can you walk comfortably?  Short distances within house.    Patient Stated Goals  Get out of pain and walk for exercise.    Currently in Pain?  Yes    Pain Score  3     Pain Location  Knee    Pain Orientation  Right    Pain Descriptors / Indicators  Dull;Aching    Pain Type  Surgical pain    Pain Onset  1 to 4 weeks ago    Pain Frequency  Constant         [facility] PT Assessment - 02/22/18 0001      Assessment   Medical Diagnosis  Right total knee replacement.    Onset Date/Surgical Date  02/13/18    Next MD Visit  02/27/2018      Restrictions   Weight Bearing Restrictions  No                   [facility] Adult PT Treatment/Exercise - 02/22/18 0001      Knee/Hip Exercises: Aerobic   Stationary Bike  x12 min backwards      Knee/Hip  Exercises: Supine   Short Arc Quad Sets  Strengthening;Right;Limitations    Short Arc Quad Sets Limitations  with VMS for neuro re-ed of R quad      Modalities   Modalities  Artist  R VMO/Quad; IFC    Electrical Stimulation Action  VMS; IFC    Electrical Stimulation Parameters  10/10, 50 pps, 5 sec ramp, 300 usec x15 min; 1-10 hz x15 min    Electrical Stimulation Goals  Neuromuscular facilitation;Pain;Edema      Vasopneumatic   Number Minutes Vasopneumatic   15 minutes    Vasopnuematic Location   Knee    Vasopneumatic Pressure  Low    Vasopneumatic Temperature   34                  PT Long Term Goals - 02/15/18 1511      PT LONG  TERM GOAL #1   Title  Ind with a HEP.    Time  4    Period  Weeks    Status  New      PT LONG TERM GOAL #2   Title  Full right active knee extension in order to normalize gait.      PT LONG TERM GOAL #3   Title  Active knee flexion to 115 to 120 degrees+ so the patient can perform functional tasks and do so with pain not > 2-3/10.    Time  4    Period  Weeks    Status  New      PT LONG TERM GOAL #4   Title  Increase right knee strength to a solid 4+/5 to provide good stability for accomplishment of functional activities.    Time  4    Period  Weeks    Status  New      PT LONG TERM GOAL #5   Title  Perform a reciprocating stair gait with one railing with pain not > 2-3/10.    Time  4    Period  Weeks    Status  New            Plan - 02/22/18 1608    Clinical Impression Statement  Patient continues to do very well following R TKR. Patient progressed to stationary bike with partial revolutions intiially but progressed to full revolutions backward. Patient able to tolerate stationary bike well. Minimal tightness experienced by patient with passive HS stretch and gastroc stretch by PTA. VMS completed to R VMO and quad to improve quad  activation as patient lacking full activation with QS. Patient able to tolerate SAQ with VMS well and patient surprised regarding how good she could extend the knee with VMS. Patient verbally instructed with HS stretch at home with belt in supine and instructed 3x 30 sec 2-4 times per day and continue other exercises. Normal modalities response noted following removal of the modalities.    Rehab Potential  Excellent    PT Frequency  3x / week    PT Duration  4 weeks    PT Treatment/Interventions  ADLs/Self Care Home Management;Cryotherapy;Occupational psychologist;Therapeutic activities;Therapeutic exercise;Neuromuscular re-education;Patient/family education;Passive range of motion;Manual techniques;Vasopneumatic Device    PT Next Visit Plan  TKA protocol.  Vaso and e' stim.    Consulted and Agree with Plan of Care  Patient       Patient will benefit from skilled therapeutic intervention in order to improve the following deficits and impairments:  Abnormal gait, Decreased activity tolerance, Decreased range of motion, Decreased strength, Increased edema, Pain  Visit Diagnosis: Chronic pain of right knee  Stiffness of right knee, not elsewhere classified  Muscle weakness (generalized)  Localized edema     Problem List Patient Active Problem List   Diagnosis Date Noted  . HTN (hypertension) 05/10/2011  . 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 GERD 12/17/2009  . NAUSEA WITH VOMITING 12/17/2009  . DYSPHAGIA UNSPECIFIED 12/17/2009    Standley Brooking, PTA 02/22/2018, 4:14 PM  [facility] [address] [place], [place], [address] Phone: [phone]   Fax:  [phone]  Name: Patricia Carr MRN: [id number] Date of Birth: July 26, 1956

## 2018-02-26 ENCOUNTER — Ambulatory Visit: Payer: Managed Care, Other (non HMO) | Admitting: Physical Therapy

## 2018-02-26 DIAGNOSIS — M25661 Stiffness of right knee, not elsewhere classified: Secondary | ICD-10-CM

## 2018-02-26 DIAGNOSIS — M6281 Muscle weakness (generalized): Secondary | ICD-10-CM

## 2018-02-26 DIAGNOSIS — R600 Localized edema: Secondary | ICD-10-CM

## 2018-02-26 DIAGNOSIS — M25561 Pain in right knee: Secondary | ICD-10-CM | POA: Diagnosis not present

## 2018-02-26 DIAGNOSIS — G8929 Other chronic pain: Secondary | ICD-10-CM

## 2018-02-26 NOTE — Therapy (Signed)
[facility] [place], [place], [address] Phone: [phone]   Fax:  [phone]  Physical Therapy Treatment  Patient Details  Name: ANGELA VAZGUEZ MRN: [id number] Date of Birth: 1956-10-21 Referring Provider: Gaynelle Arabian MD.   Encounter Date: 02/26/2018  PT End of Session - 02/26/18 1900    Visit Number  5    Number of Visits  12    Date for PT Re-Evaluation  03/29/18    PT Start Time  0145    PT Stop Time  0244    PT Time Calculation (min)  59 min    Activity Tolerance  Patient tolerated treatment well    Behavior During Therapy  [facility] for tasks assessed/performed       Past Medical History:  Diagnosis Date  . Anemia   . Bronchitis   . GERD (gastroesophageal reflux disease)   . Hypertension   . Joint pain   . Sinus complaint   . Sleep apnea   . Wears glasses     Past Surgical History:  Procedure Laterality Date  . COLONOSCOPY    . TUBAL LIGATION  1985    There were no vitals filed for this visit.  Subjective Assessment - 02/26/18 1901    Subjective  Going good today.    Patient Stated Goals  Get out of pain and walk for exercise.    Pain Score  3     Pain Location  Knee    Pain Orientation  Right    Pain Descriptors / Indicators  Aching;Dull    Pain Onset  1 to 4 weeks ago                       [facility] Adult PT Treatment/Exercise - 02/26/18 0001      Exercises   Exercises  Knee/Hip      Knee/Hip Exercises: Aerobic   Stationary Bike  On [employer] stationary bike x 15 minutes.  Patient able to make forward revolutions and eventually moved forward to seat 2.      Knee/Hip Exercises: Supine   Short Arc Quad Sets Limitations  SAQ's facilitated with 4 electrode VMS with 10 sec extension holds and 10 sec rest x 15 minutes.      Acupuncturist Location  Right knee.    Electrical Stimulation Action  IFC at 1-10 Hz x 15 minutes.    Electrical Stimulation  Goals  Edema;Pain                  PT Long Term Goals - 02/15/18 1511      PT LONG TERM GOAL #1   Title  Ind with a HEP.    Time  4    Period  Weeks    Status  New      PT LONG TERM GOAL #2   Title  Full right active knee extension in order to normalize gait.      PT LONG TERM GOAL #3   Title  Active knee flexion to 115 to 120 degrees+ so the patient can perform functional tasks and do so with pain not > 2-3/10.    Time  4    Period  Weeks    Status  New      PT LONG TERM GOAL #4   Title  Increase right knee strength to a solid 4+/5 to provide good stability for accomplishment of functional activities.    Time  4    Period  Weeks    Status  New      PT LONG TERM GOAL #5   Title  Perform a reciprocating stair gait with one railing with pain not > 2-3/10.    Time  4    Period  Weeks    Status  New            Plan - 02/26/18 1904    Clinical Impression Statement  Excellent job today.  Patient able to make forward revolutions on bike and able to move forward times two on bike.    PT Treatment/Interventions  ADLs/Self Care Home Management;Cryotherapy;Occupational psychologist;Therapeutic activities;Therapeutic exercise;Neuromuscular re-education;Patient/family education;Passive range of motion;Manual techniques;Vasopneumatic Device    PT Next Visit Plan  TKA protocol.  Vaso and e' stim.    Consulted and Agree with Plan of Care  Patient       Patient will benefit from skilled therapeutic intervention in order to improve the following deficits and impairments:  Abnormal gait, Decreased activity tolerance, Decreased range of motion, Decreased strength, Increased edema, Pain  Visit Diagnosis: Chronic pain of right knee  Stiffness of right knee, not elsewhere classified  Muscle weakness (generalized)  Localized edema     Problem List Patient Active Problem List   Diagnosis Date Noted  . HTN (hypertension) 05/10/2011  . 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 GERD 12/17/2009  . NAUSEA WITH VOMITING 12/17/2009  . DYSPHAGIA UNSPECIFIED 12/17/2009    Kaitlynn Tramontana, [place] MPT 02/26/2018, 7:09 PM  [facility] [address] [place], [place], [address] Phone: [phone]   Fax:  [phone]  Name: JEZEBELLE LEDWELL MRN: [id number] Date of Birth: 18-Nov-1955

## 2018-02-27 ENCOUNTER — Encounter: Payer: Self-pay | Admitting: Physical Therapy

## 2018-02-27 ENCOUNTER — Ambulatory Visit: Payer: Managed Care, Other (non HMO) | Admitting: Physical Therapy

## 2018-02-27 DIAGNOSIS — M25561 Pain in right knee: Principal | ICD-10-CM

## 2018-02-27 DIAGNOSIS — R600 Localized edema: Secondary | ICD-10-CM

## 2018-02-27 DIAGNOSIS — M25661 Stiffness of right knee, not elsewhere classified: Secondary | ICD-10-CM

## 2018-02-27 DIAGNOSIS — M6281 Muscle weakness (generalized): Secondary | ICD-10-CM

## 2018-02-27 DIAGNOSIS — G8929 Other chronic pain: Secondary | ICD-10-CM

## 2018-02-27 NOTE — Therapy (Signed)
[facility] [place], [place], [address] Phone: [phone]   Fax:  [phone]  Physical Therapy Treatment  Patient Details  Name: Patricia Carr MRN: [id number] Date of Birth: 01-Jan-1956 Referring Provider: Gaynelle Arabian MD.   Encounter Date: 02/27/2018  PT End of Session - 02/27/18 1354    Visit Number  6    Number of Visits  12    Date for PT Re-Evaluation  03/29/18    PT Start Time  3151    PT Stop Time  1502    PT Time Calculation (min)  70 min    Activity Tolerance  Patient tolerated treatment well    Behavior During Therapy  [facility] for tasks assessed/performed       Past Medical History:  Diagnosis Date  . Anemia   . Bronchitis   . GERD (gastroesophageal reflux disease)   . Hypertension   . Joint pain   . Sinus complaint   . Sleep apnea   . Wears glasses     Past Surgical History:  Procedure Laterality Date  . COLONOSCOPY    . TUBAL LIGATION  1985    There were no vitals filed for this visit.  Subjective Assessment - 02/27/18 1336    Subjective  Reports knee pain today but took pain meds prior to PT. Reports that walking hurts.    Pertinent History  Chronic knee pain.    How long can you walk comfortably?  Short distances within house.    Patient Stated Goals  Get out of pain and walk for exercise.    Currently in Pain?  Yes    Pain Score  3     Pain Location  Knee    Pain Orientation  Right    Pain Descriptors / Indicators  Discomfort    Pain Type  Surgical pain    Pain Onset  1 to 4 weeks ago         [facility] PT Assessment - 02/27/18 0001      Assessment   Medical Diagnosis  Right total knee replacement.    Onset Date/Surgical Date  02/13/18    Next MD Visit  02/27/2018      Restrictions   Weight Bearing Restrictions  No      Observation/Other Assessments-Edema    Edema  Circumferential      Circumferential Edema   Circumferential - Right  51.2 cm    Circumferential - Left   49.7  cm      ROM / Strength   AROM / PROM / Strength  AROM      AROM   Overall AROM   Deficits    AROM Assessment Site  Knee    Right/Left Knee  Right    Right Knee Extension  -10    Right Knee Flexion  102                   [facility] Adult PT Treatment/Exercise - 02/27/18 0001      Knee/Hip Exercises: Aerobic   Stationary Bike  On [employer] stationary bike x 17 minutes.  Patient able to make forward revolutions and eventually moved forward to seat 2.      Knee/Hip Exercises: Standing   Forward Lunges  Right;2 sets;10 reps;3 seconds    Functional Squat  2 sets;10 reps      Modalities   Modalities  Psychologist, educational Location  R  Research officer, trade union  VMS co- Midwife Parameters  10/10, 50 pps, 5 sec ramp, 300 usec x15 min    Electrical Stimulation Goals  Neuromuscular facilitation      Vasopneumatic   Number Minutes Vasopneumatic   15 minutes    Vasopnuematic Location   Knee    Vasopneumatic Pressure  Low    Vasopneumatic Temperature   34                  PT Long Term Goals - 02/15/18 1511      PT LONG TERM GOAL #1   Title  Ind with a HEP.    Time  4    Period  Weeks    Status  New      PT LONG TERM GOAL #2   Title  Full right active knee extension in order to normalize gait.      PT LONG TERM GOAL #3   Title  Active knee flexion to 115 to 120 degrees+ so the patient can perform functional tasks and do so with pain not > 2-3/10.    Time  4    Period  Weeks    Status  New      PT LONG TERM GOAL #4   Title  Increase right knee strength to a solid 4+/5 to provide good stability for accomplishment of functional activities.    Time  4    Period  Weeks    Status  New      PT LONG TERM GOAL #5   Title  Perform a reciprocating stair gait with one railing with pain not > 2-3/10.    Time  4    Period  Weeks    Status  New             Plan - 02/27/18 1440    Clinical Impression Statement  Patient tolerated today's treatment well and continues to excel following R TKR on 02/13/2018. Patient able to tolerate exercises well and able to tolerate forward full revolutions of stationary bike. VMS continued due to lack of voluntary activation of R quad. AROM of R knee measured as 10-102 deg. 1.5 cm difference with R knee greater than LLE. No AD used at this time with R TED hose donned. Normal modalities response noted following removal of the modalities.    Rehab Potential  Excellent    PT Frequency  3x / week    PT Duration  4 weeks    PT Treatment/Interventions  ADLs/Self Care Home Management;Cryotherapy;Occupational psychologist;Therapeutic activities;Therapeutic exercise;Neuromuscular re-education;Patient/family education;Passive range of motion;Manual techniques;Vasopneumatic Device    PT Next Visit Plan  TKA protocol.  Vaso and e' stim.    Consulted and Agree with Plan of Care  Patient       Patient will benefit from skilled therapeutic intervention in order to improve the following deficits and impairments:  Abnormal gait, Decreased activity tolerance, Decreased range of motion, Decreased strength, Increased edema, Pain  Visit Diagnosis: Chronic pain of right knee  Stiffness of right knee, not elsewhere classified  Muscle weakness (generalized)  Localized edema     Problem List Patient Active Problem List   Diagnosis Date Noted  . HTN (hypertension) 05/10/2011  . 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 GERD 12/17/2009  . NAUSEA WITH VOMITING 12/17/2009  . DYSPHAGIA UNSPECIFIED 12/17/2009    Standley Brooking, PTA 02/27/18 3:05 PM   [facility] [place], [place], [address] Phone: [phone]   Fax:  [phone]  Name: Patricia Carr MRN: [id number] Date of Birth: [birth year]

## 2018-03-02 ENCOUNTER — Ambulatory Visit: Payer: Managed Care, Other (non HMO) | Attending: Orthopedic Surgery | Admitting: Physical Therapy

## 2018-03-02 ENCOUNTER — Encounter: Payer: Self-pay | Admitting: Physical Therapy

## 2018-03-02 DIAGNOSIS — M25661 Stiffness of right knee, not elsewhere classified: Secondary | ICD-10-CM | POA: Diagnosis present

## 2018-03-02 DIAGNOSIS — G8929 Other chronic pain: Secondary | ICD-10-CM

## 2018-03-02 DIAGNOSIS — M6281 Muscle weakness (generalized): Secondary | ICD-10-CM

## 2018-03-02 DIAGNOSIS — R600 Localized edema: Secondary | ICD-10-CM | POA: Diagnosis present

## 2018-03-02 DIAGNOSIS — M25561 Pain in right knee: Secondary | ICD-10-CM | POA: Diagnosis present

## 2018-03-02 NOTE — Therapy (Signed)
[facility] [place], [place], [address] Phone: [phone]   Fax:  [phone]  Physical Therapy Treatment  Patient Details  Name: Patricia Carr MRN: [id number] Date of Birth: 09/13/1956 Referring Provider: Gaynelle Arabian MD.   Encounter Date: 03/02/2018  PT End of Session - 03/02/18 1216    Visit Number  7    Number of Visits  12    Date for PT Re-Evaluation  03/29/18    PT Start Time  1116    PT Stop Time  1234    PT Time Calculation (min)  78 min       Past Medical History:  Diagnosis Date  . Anemia   . Bronchitis   . GERD (gastroesophageal reflux disease)   . Hypertension   . Joint pain   . Sinus complaint   . Sleep apnea   . Wears glasses     Past Surgical History:  Procedure Laterality Date  . COLONOSCOPY    . TUBAL LIGATION  1985    There were no vitals filed for this visit.  Subjective Assessment - 03/02/18 1217    Subjective  I was sore after last treatment.    How long can you walk comfortably?  Short distances within house.    Patient Stated Goals  Get out of pain and walk for exercise.    Currently in Pain?  Yes    Pain Score  4     Pain Location  Knee    Pain Orientation  Right    Pain Descriptors / Indicators  Discomfort    Pain Type  Surgical pain    Pain Onset  1 to 4 weeks ago                       [facility] Adult PT Treatment/Exercise - 03/02/18 0001      Exercises   Exercises  Knee/Hip      Knee/Hip Exercises: Aerobic   Stationary Bike  On Spirit bike x 16 minutes.      Knee/Hip Exercises: Machines for Strengthening   Cybex Knee Extension  10# x 4 minutes    Cybex Knee Flexion  30# x 4 minutes.    Cybex Leg Press  2 plates x 4 minutes.      Knee/Hip Exercises: Supine   Short Arc Quad Sets Limitations  SAQ's facilitated with 4 electrode VMS to patient's right quadriceps x 10 minutes (10 sec extension holds and 10 second rest).      Modalities   Modalities   Health visitor Stimulation Location  Right knee.    Electrical Stimulation Action  IFC at 1-10 Hz x 20 minutes.    Electrical Stimulation Goals  Edema;Pain      Vasopneumatic   Number Minutes Vasopneumatic   20 minutes    Vasopnuematic Location   -- Right knee.    Vasopneumatic Pressure  Medium                  PT Long Term Goals - 02/15/18 1511      PT LONG TERM GOAL #1   Title  Ind with a HEP.    Time  4    Period  Weeks    Status  New      PT LONG TERM GOAL #2   Title  Full right active knee extension in order to normalize gait.  PT LONG TERM GOAL #3   Title  Active knee flexion to 115 to 120 degrees+ so the patient can perform functional tasks and do so with pain not > 2-3/10.    Time  4    Period  Weeks    Status  New      PT LONG TERM GOAL #4   Title  Increase right knee strength to a solid 4+/5 to provide good stability for accomplishment of functional activities.    Time  4    Period  Weeks    Status  New      PT LONG TERM GOAL #5   Title  Perform a reciprocating stair gait with one railing with pain not > 2-3/10.    Time  4    Period  Weeks    Status  New            Plan - 03/02/18 1220    Clinical Impression Statement  Patient is making outstnading progress.  Her active right knee extension has done very well with VMS.      PT Treatment/Interventions  ADLs/Self Care Home Management;Cryotherapy;Occupational psychologist;Therapeutic activities;Therapeutic exercise;Neuromuscular re-education;Patient/family education;Passive range of motion;Manual techniques;Vasopneumatic Device    PT Next Visit Plan  TKA protocol.  Vaso and e' stim.    Consulted and Agree with Plan of Care  Patient       Patient will benefit from skilled therapeutic intervention in order to improve the following deficits and impairments:  Abnormal gait, Decreased activity tolerance,  Decreased range of motion, Decreased strength, Increased edema, Pain  Visit Diagnosis: Chronic pain of right knee  Stiffness of right knee, not elsewhere classified  Muscle weakness (generalized)  Localized edema     Problem List Patient Active Problem List   Diagnosis Date Noted  . HTN (hypertension) 05/10/2011  . 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 GERD 12/17/2009  . NAUSEA WITH VOMITING 12/17/2009  . DYSPHAGIA UNSPECIFIED 12/17/2009    Santresa Levett, [place] MPT 03/02/2018, 12:43 PM  [facility] [address] [place], [place], [address] Phone: [phone]   Fax:  [phone]  Name: Patricia Carr MRN: [id number] Date of Birth: [birth year]

## 2018-03-05 ENCOUNTER — Encounter: Payer: Self-pay | Admitting: Physical Therapy

## 2018-03-05 ENCOUNTER — Ambulatory Visit: Payer: Managed Care, Other (non HMO) | Admitting: Physical Therapy

## 2018-03-05 DIAGNOSIS — G8929 Other chronic pain: Secondary | ICD-10-CM

## 2018-03-05 DIAGNOSIS — M25561 Pain in right knee: Principal | ICD-10-CM

## 2018-03-05 DIAGNOSIS — M6281 Muscle weakness (generalized): Secondary | ICD-10-CM

## 2018-03-05 DIAGNOSIS — R600 Localized edema: Secondary | ICD-10-CM

## 2018-03-05 DIAGNOSIS — M25661 Stiffness of right knee, not elsewhere classified: Secondary | ICD-10-CM

## 2018-03-05 NOTE — Therapy (Signed)
[facility] [place], [place], [address] Phone: [phone]   Fax:  [phone]  Physical Therapy Treatment  Patient Details  Name: Patricia Carr MRN: [id number] Date of Birth: [birth year] Referring Provider: Gaynelle Arabian MD.   Encounter Date: 03/05/2018  PT End of Session - 03/05/18 1521    Visit Number  8    Number of Visits  12    Date for PT Re-Evaluation  03/29/18    PT Start Time  3545    PT Stop Time  1624    PT Time Calculation (min)  68 min    Activity Tolerance  Patient tolerated treatment well    Behavior During Therapy  [facility] for tasks assessed/performed       Past Medical History:  Diagnosis Date  . Anemia   . Bronchitis   . GERD (gastroesophageal reflux disease)   . Hypertension   . Joint pain   . Sinus complaint   . Sleep apnea   . Wears glasses     Past Surgical History:  Procedure Laterality Date  . COLONOSCOPY    . TUBAL LIGATION  1985    There were no vitals filed for this visit.  Subjective Assessment - 03/05/18 1520    Subjective  Reports that she has some popping at times but no pain during the popping.    Pertinent History  Chronic knee pain.    How long can you walk comfortably?  Short distances within house.    Patient Stated Goals  Get out of pain and walk for exercise.    Currently in Pain?  Other (Comment) No pain assessment provided by patient          [facility] PT Assessment - 03/05/18 0001      Assessment   Medical Diagnosis  Right total knee replacement.    Onset Date/Surgical Date  02/13/18    Next MD Visit  03/20/2018      Restrictions   Weight Bearing Restrictions  No                   [facility] Adult PT Treatment/Exercise - 03/05/18 0001      Knee/Hip Exercises: Aerobic   Stationary Bike  L3, seat 7 x12 min      Knee/Hip Exercises: Machines for Strengthening   Cybex Leg Press  2 pl, seat 6 x30 reps with ball squeeze      Knee/Hip Exercises: Standing    Forward Lunges  Right;2 sets;10 reps;3 seconds    Lateral Step Up  Right;20 reps;Hand Hold: 2;Step Height: 6"    Forward Step Up  Right;20 reps;Hand Hold: 2;Step Height: 6"    Functional Squat  2 sets;10 reps      Knee/Hip Exercises: Supine   Short Arc Quad Sets  Strengthening;Right;Limitations    Short Arc Quad Sets Limitations  facilitated by VMS with 3# ankleweight      Modalities   Modalities  Artist  R VMO/ Quad; IFC    Printmaker Action  VMS; IFC    Electrical Stimulation Parameters  10/10, 50 pps, 5 sec ramp, 300 usec x15 min    Electrical Stimulation Goals  Edema;Pain      Vasopneumatic   Number Minutes Vasopneumatic   10 minutes    Vasopnuematic Location   Knee    Vasopneumatic Pressure  Low    Vasopneumatic Temperature   34  PT Long Term Goals - 03/05/18 1521      PT LONG TERM GOAL #1   Title  Ind with a HEP.    Time  4    Period  Weeks    Status  On-going      PT LONG TERM GOAL #2   Title  Full right active knee extension in order to normalize gait.    Status  On-going      PT LONG TERM GOAL #3   Title  Active knee flexion to 115 to 120 degrees+ so the patient can perform functional tasks and do so with pain not > 2-3/10.    Time  4    Period  Weeks    Status  On-going      PT LONG TERM GOAL #4   Title  Increase right knee strength to a solid 4+/5 to provide good stability for accomplishment of functional activities.    Time  4    Period  Weeks    Status  On-going      PT LONG TERM GOAL #5   Title  Perform a reciprocating stair gait with one railing with pain not > 2-3/10.    Time  4    Period  Weeks    Status  On-going            Plan - 03/05/18 1630    Clinical Impression Statement  Patient tolerated today's treatment well with no complaints upon arrival. Patient guided through more functional strengthening  such as forward step ups but required VCs to avoid R hip circumduction. VMS continued today to R VMO and quad with 3# ankleweight to emphasize strengthening. Normal modalities response noted following removal of the modalities. Patient reported end of treatment of minimal pain to R lateral knee.    Rehab Potential  Excellent    PT Frequency  3x / week    PT Duration  4 weeks    PT Treatment/Interventions  ADLs/Self Care Home Management;Cryotherapy;Occupational psychologist;Therapeutic activities;Therapeutic exercise;Neuromuscular re-education;Patient/family education;Passive range of motion;Manual techniques;Vasopneumatic Device    PT Next Visit Plan  TKA protocol.  Vaso and e' stim.    Consulted and Agree with Plan of Care  Patient       Patient will benefit from skilled therapeutic intervention in order to improve the following deficits and impairments:  Abnormal gait, Decreased activity tolerance, Decreased range of motion, Decreased strength, Increased edema, Pain  Visit Diagnosis: Chronic pain of right knee  Stiffness of right knee, not elsewhere classified  Muscle weakness (generalized)  Localized edema     Problem List Patient Active Problem List   Diagnosis Date Noted  . HTN (hypertension) 05/10/2011  . 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 GERD 12/17/2009  . NAUSEA WITH VOMITING 12/17/2009  . DYSPHAGIA UNSPECIFIED 12/17/2009    Standley Brooking, PTA 03/05/2018, 4:35 PM  [facility] [address] [place], [place], [address] Phone: [phone]   Fax:  [phone]  Name: DEBORRAH MABIN MRN: [id number] Date of Birth: 1956/01/12

## 2018-03-07 ENCOUNTER — Ambulatory Visit: Payer: Managed Care, Other (non HMO) | Admitting: Physical Therapy

## 2018-03-07 DIAGNOSIS — R600 Localized edema: Secondary | ICD-10-CM

## 2018-03-07 DIAGNOSIS — M6281 Muscle weakness (generalized): Secondary | ICD-10-CM

## 2018-03-07 DIAGNOSIS — M25661 Stiffness of right knee, not elsewhere classified: Secondary | ICD-10-CM

## 2018-03-07 DIAGNOSIS — M25561 Pain in right knee: Secondary | ICD-10-CM | POA: Diagnosis not present

## 2018-03-07 DIAGNOSIS — G8929 Other chronic pain: Secondary | ICD-10-CM

## 2018-03-07 NOTE — Therapy (Signed)
[facility] [place], [place], [address] Phone: [phone]   Fax:  [phone]  Physical Therapy Treatment  Patient Details  Name: Patricia Carr MRN: [id number] Date of Birth: Dec 26, 1955 Referring Provider: Gaynelle Arabian MD.   Encounter Date: 03/07/2018  PT End of Session - 03/07/18 1439    Visit Number  9    Number of Visits  12    Date for PT Re-Evaluation  03/29/18    PT Start Time  1430    PT Stop Time  1530    PT Time Calculation (min)  60 min    Activity Tolerance  Patient tolerated treatment well    Behavior During Therapy  [facility] for tasks assessed/performed       Past Medical History:  Diagnosis Date  . Anemia   . Bronchitis   . GERD (gastroesophageal reflux disease)   . Hypertension   . Joint pain   . Sinus complaint   . Sleep apnea   . Wears glasses     Past Surgical History:  Procedure Laterality Date  . COLONOSCOPY    . TUBAL LIGATION  1985    There were no vitals filed for this visit.  Subjective Assessment - 03/07/18 1439    Subjective  Patient reported no new complaints    Pertinent History  Chronic knee pain.    How long can you walk comfortably?  Short distances within house.    Patient Stated Goals  Get out of pain and walk for exercise.    Currently in Pain?  -- No pain scale provided         [facility] PT Assessment - 03/07/18 0001      Assessment   Medical Diagnosis  Right total knee replacement.    Onset Date/Surgical Date  02/13/18    Next MD Visit  03/20/2018      Restrictions   Weight Bearing Restrictions  No                   [facility] Adult PT Treatment/Exercise - 03/07/18 0001      Exercises   Exercises  Knee/Hip      Knee/Hip Exercises: Aerobic   Stationary Bike  L3, seat 7 x15 min      Knee/Hip Exercises: Machines for Strengthening   Cybex Leg Press  1 plate 2x10 eccentric control      Knee/Hip Exercises: Standing   Functional Squat  3 sets;10 reps at  sink    Other Standing Knee Exercises  lateral band walking red theraband x2 minutes      Knee/Hip Exercises: Supine   Short Arc Quad Sets  Strengthening;Right;Limitations    Short Arc Quad Sets Limitations  VMS (see below) x15 min #3      Modalities   Modalities  Artist  R VMO/ Quad; IFC    Electrical Stimulation Action  VMS;IFC    Electrical Stimulation Parameters  10/10, 50pps, 300 usec, 5 second ramp x15; 80-150 hz x10    Electrical Stimulation Goals  Strength;Pain      Vasopneumatic   Number Minutes Vasopneumatic   10 minutes    Vasopnuematic Location   Knee    Vasopneumatic Pressure  Low    Vasopneumatic Temperature   34                  PT Long Term Goals - 03/05/18 1521  PT LONG TERM GOAL #1   Title  Ind with a HEP.    Time  4    Period  Weeks    Status  On-going      PT LONG TERM GOAL #2   Title  Full right active knee extension in order to normalize gait.    Status  On-going      PT LONG TERM GOAL #3   Title  Active knee flexion to 115 to 120 degrees+ so the patient can perform functional tasks and do so with pain not > 2-3/10.    Time  4    Period  Weeks    Status  On-going      PT LONG TERM GOAL #4   Title  Increase right knee strength to a solid 4+/5 to provide good stability for accomplishment of functional activities.    Time  4    Period  Weeks    Status  On-going      PT LONG TERM GOAL #5   Title  Perform a reciprocating stair gait with one railing with pain not > 2-3/10.    Time  4    Period  Weeks    Status  On-going              Patient will benefit from skilled therapeutic intervention in order to improve the following deficits and impairments:     Visit Diagnosis: No diagnosis found.     Problem List Patient Active Problem List   Diagnosis Date Noted  . HTN (hypertension) 05/10/2011  . Lap Roux Y Gastric Bypass May  2012 05/10/2011  . GERD 12/17/2009  . NAUSEA WITH VOMITING 12/17/2009  . DYSPHAGIA UNSPECIFIED 12/17/2009    Gabriela Eves, PT, DPT 03/07/2018, 3:29 PM  [facility] [address] [place], [place], [address] Phone: [phone]   Fax:  [phone]  Name: Patricia Carr MRN: [id number] Date of Birth: [birth year]

## 2018-03-08 ENCOUNTER — Ambulatory Visit: Payer: Managed Care, Other (non HMO) | Admitting: Physical Therapy

## 2018-03-08 ENCOUNTER — Encounter: Payer: Self-pay | Admitting: Physical Therapy

## 2018-03-08 DIAGNOSIS — M25561 Pain in right knee: Principal | ICD-10-CM

## 2018-03-08 DIAGNOSIS — M25661 Stiffness of right knee, not elsewhere classified: Secondary | ICD-10-CM

## 2018-03-08 DIAGNOSIS — G8929 Other chronic pain: Secondary | ICD-10-CM

## 2018-03-08 DIAGNOSIS — M6281 Muscle weakness (generalized): Secondary | ICD-10-CM

## 2018-03-08 DIAGNOSIS — R600 Localized edema: Secondary | ICD-10-CM

## 2018-03-08 NOTE — Therapy (Signed)
[facility] [place], [place], [address] Phone: [phone]   Fax:  [phone]  Physical Therapy Treatment  Patient Details  Name: Patricia Carr MRN: [id number] Date of Birth: 29-Oct-1956 Referring Provider: Gaynelle Arabian MD.   Encounter Date: 03/08/2018  PT End of Session - 03/08/18 1506    Visit Number  10    Number of Visits  12    Date for PT Re-Evaluation  03/29/18    PT Start Time  1430    PT Stop Time  1520    PT Time Calculation (min)  50 min    Activity Tolerance  Patient tolerated treatment well    Behavior During Therapy  [facility] for tasks assessed/performed       Past Medical History:  Diagnosis Date  . Anemia   . Bronchitis   . GERD (gastroesophageal reflux disease)   . Hypertension   . Joint pain   . Sinus complaint   . Sleep apnea   . Wears glasses     Past Surgical History:  Procedure Laterality Date  . COLONOSCOPY    . TUBAL LIGATION  1985    There were no vitals filed for this visit.  Subjective Assessment - 03/08/18 1431    Subjective  Patient reported some "feeling of popping in post knee when walking"    Pertinent History  Chronic knee pain.    How long can you walk comfortably?  Short distances within house.    Patient Stated Goals  Get out of pain and walk for exercise.    Currently in Pain?  Yes    Pain Score  3     Pain Location  Knee    Pain Orientation  Right    Pain Descriptors / Indicators  Discomfort    Pain Type  Surgical pain    Pain Onset  1 to 4 weeks ago    Pain Frequency  Intermittent    Aggravating Factors   prolong activity    Pain Relieving Factors  at rest         [facility] PT Assessment - 03/08/18 0001      ROM / Strength   AROM / PROM / Strength  AROM;PROM      AROM   AROM Assessment Site  Knee    Right/Left Knee  Right    Right Knee Extension  -10    Right Knee Flexion  100      PROM   PROM Assessment Site  Knee    Right/Left Knee  Right    Right  Knee Extension  -5    Right Knee Flexion  110                   [facility] Adult PT Treatment/Exercise - 03/08/18 0001      Knee/Hip Exercises: Aerobic   Stationary Bike  L3, seat 4 x15 min      Knee/Hip Exercises: Standing   Forward Step Up  Right;20 reps;Hand Hold: 0;Step Height: 6"      Acupuncturist Location  R VMO/quad    Electrical Stimulation Action  VMS    Electrical Stimulation Parameters  10/10 cocontract x 91min with 3# SAQ for activation    Printmaker Goals  Neuromuscular facilitation;Strength      Vasopneumatic   Number Minutes Vasopneumatic   10 minutes    Vasopnuematic Location   Knee    Vasopneumatic Pressure  Low  Manual Therapy   Manual Therapy  Soft tissue mobilization;Passive ROM    Manual therapy comments  Prone hang ext stretch with manual STW to lateral HS to increase ROM for ext and decrease discomfort in tight HS                  PT Long Term Goals - 03/05/18 1521      PT LONG TERM GOAL #1   Title  Ind with a HEP.    Time  4    Period  Weeks    Status  On-going      PT LONG TERM GOAL #2   Title  Full right active knee extension in order to normalize gait.    Status  On-going      PT LONG TERM GOAL #3   Title  Active knee flexion to 115 to 120 degrees+ so the patient can perform functional tasks and do so with pain not > 2-3/10.    Time  4    Period  Weeks    Status  On-going      PT LONG TERM GOAL #4   Title  Increase right knee strength to a solid 4+/5 to provide good stability for accomplishment of functional activities.    Time  4    Period  Weeks    Status  On-going      PT LONG TERM GOAL #5   Title  Perform a reciprocating stair gait with one railing with pain not > 2-3/10.    Time  4    Period  Weeks    Status  On-going            Plan - 03/08/18 1508    Clinical Impression Statement  Patient tolerated treatment well today. Patient reported some  discomfort "popping" on occasion over the last week in lateral HS for unknown reason. Today focused on gentle ROM and manual STW to increase ROM and decrease discomfort. Patient current goals ongoing.     Rehab Potential  Excellent    PT Frequency  3x / week    PT Duration  4 weeks    PT Treatment/Interventions  ADLs/Self Care Home Management;Cryotherapy;Occupational psychologist;Therapeutic activities;Therapeutic exercise;Neuromuscular re-education;Patient/family education;Passive range of motion;Manual techniques;Vasopneumatic Device    PT Next Visit Plan  cont with POC for TKA protocol.  Vaso and e' stim.    Consulted and Agree with Plan of Care  Patient       Patient will benefit from skilled therapeutic intervention in order to improve the following deficits and impairments:  Abnormal gait, Decreased activity tolerance, Decreased range of motion, Decreased strength, Increased edema, Pain  Visit Diagnosis: Chronic pain of right knee  Stiffness of right knee, not elsewhere classified  Muscle weakness (generalized)  Localized edema     Problem List Patient Active Problem List   Diagnosis Date Noted  . HTN (hypertension) 05/10/2011  . 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 GERD 12/17/2009  . NAUSEA WITH VOMITING 12/17/2009  . DYSPHAGIA UNSPECIFIED 12/17/2009    Ladean Raya, PTA 03/08/18 3:27 PM  [facility] [place], [place], [address] Phone: [phone]   Fax:  [phone]  Name: Patricia Carr MRN: [id number] Date of Birth: 1956/02/10

## 2018-03-12 ENCOUNTER — Encounter: Payer: Self-pay | Admitting: Physical Therapy

## 2018-03-12 ENCOUNTER — Ambulatory Visit: Payer: Managed Care, Other (non HMO) | Admitting: Physical Therapy

## 2018-03-12 DIAGNOSIS — M25661 Stiffness of right knee, not elsewhere classified: Secondary | ICD-10-CM

## 2018-03-12 DIAGNOSIS — M25561 Pain in right knee: Secondary | ICD-10-CM | POA: Diagnosis not present

## 2018-03-12 DIAGNOSIS — G8929 Other chronic pain: Secondary | ICD-10-CM

## 2018-03-12 DIAGNOSIS — R600 Localized edema: Secondary | ICD-10-CM

## 2018-03-12 DIAGNOSIS — M6281 Muscle weakness (generalized): Secondary | ICD-10-CM

## 2018-03-12 NOTE — Therapy (Signed)
[facility] [place], [place], [address] Phone: [phone]   Fax:  [phone]  Physical Therapy Treatment  Patient Details  Name: Patricia Carr MRN: [id number] Date of Birth: [birth year] Referring Provider: Gaynelle Arabian MD.   Encounter Date: 03/12/2018  PT End of Session - 03/12/18 1440    Visit Number  11    Number of Visits  12    Date for PT Re-Evaluation  03/29/18    PT Start Time  3810    PT Stop Time  1538    PT Time Calculation (min)  65 min    Activity Tolerance  Patient tolerated treatment well    Behavior During Therapy  [facility] for tasks assessed/performed       Past Medical History:  Diagnosis Date  . Anemia   . Bronchitis   . GERD (gastroesophageal reflux disease)   . Hypertension   . Joint pain   . Sinus complaint   . Sleep apnea   . Wears glasses     Past Surgical History:  Procedure Laterality Date  . COLONOSCOPY    . TUBAL LIGATION  1985    There were no vitals filed for this visit.  Subjective Assessment - 03/12/18 1436    Subjective  Reports she is not taking any pain medication but has lowered her dose of neurotin and has been having more shooting pain medial to incision.    Pertinent History  Chronic knee pain.    How long can you walk comfortably?  Short distances within house.    Patient Stated Goals  Get out of pain and walk for exercise.    Currently in Pain?  Other (Comment) No pain assessment provided by patient today         [facility] PT Assessment - 03/12/18 0001      Assessment   Medical Diagnosis  Right total knee replacement.    Onset Date/Surgical Date  02/13/18    Next MD Visit  03/20/2018      Restrictions   Weight Bearing Restrictions  No                   [facility] Adult PT Treatment/Exercise - 03/12/18 0001      Knee/Hip Exercises: Aerobic   Stationary Bike  L4, seat 4 x15 min      Knee/Hip Exercises: Machines for Strengthening   Cybex Leg Press  2  pl, seat 5, 2x10 reps eccentric control      Knee/Hip Exercises: Standing   Terminal Knee Extension  Strengthening;Right;5 reps Attempted but no quad activation detected      Knee/Hip Exercises: Supine   Short Arc Quad Sets  Right;Limitations    Short Arc Quad Sets Limitations  VMS with SAQ 3# for neuro re-ed of L quad      Knee/Hip Exercises: Prone   Prone Knee Hang  5 minutes      Modalities   Modalities  Psychologist, educational Location  R VMO/quad; R knee    Electrical Stimulation Action  VMS with SAQ 3#; IFC    Electrical Stimulation Parameters  co-contract, 10/10, 50 pps, 5 sec ramp, 300 usec x15 min; 1-10 hz x15 min    Electrical Stimulation Goals  Neuromuscular facilitation;Edema      Vasopneumatic   Number Minutes Vasopneumatic   15 minutes    Vasopnuematic Location   Knee    Vasopneumatic Pressure  Low  Vasopneumatic Temperature   34                  PT Long Term Goals - 03/05/18 1521      PT LONG TERM GOAL #1   Title  Ind with a HEP.    Time  4    Period  Weeks    Status  On-going      PT LONG TERM GOAL #2   Title  Full right active knee extension in order to normalize gait.    Status  On-going      PT LONG TERM GOAL #3   Title  Active knee flexion to 115 to 120 degrees+ so the patient can perform functional tasks and do so with pain not > 2-3/10.    Time  4    Period  Weeks    Status  On-going      PT LONG TERM GOAL #4   Title  Increase right knee strength to a solid 4+/5 to provide good stability for accomplishment of functional activities.    Time  4    Period  Weeks    Status  On-going      PT LONG TERM GOAL #5   Title  Perform a reciprocating stair gait with one railing with pain not > 2-3/10.    Time  4    Period  Weeks    Status  On-going            Plan - 03/12/18 1627    Clinical Impression Statement  Patient continues to tolerate treatments well but  still lacking voluntary R quad activation. Eccentric control completed with leg press but patient progressed to TKE when no voluntary activation detected with increased compensatory strategies. Co contract VMS with 3# ankleweight completed again today. Normal modalites response noted following removal of the modalities.    Rehab Potential  Excellent    PT Frequency  3x / week    PT Duration  4 weeks    PT Treatment/Interventions  ADLs/Self Care Home Management;Cryotherapy;Occupational psychologist;Therapeutic activities;Therapeutic exercise;Neuromuscular re-education;Patient/family education;Passive range of motion;Manual techniques;Vasopneumatic Device    PT Next Visit Plan  cont with POC for TKA protocol.  Vaso and e' stim.    Consulted and Agree with Plan of Care  Patient       Patient will benefit from skilled therapeutic intervention in order to improve the following deficits and impairments:  Abnormal gait, Decreased activity tolerance, Decreased range of motion, Decreased strength, Increased edema, Pain  Visit Diagnosis: Chronic pain of right knee  Stiffness of right knee, not elsewhere classified  Muscle weakness (generalized)  Localized edema     Problem List Patient Active Problem List   Diagnosis Date Noted  . HTN (hypertension) 05/10/2011  . 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 GERD 12/17/2009  . NAUSEA WITH VOMITING 12/17/2009  . DYSPHAGIA UNSPECIFIED 12/17/2009    Standley Brooking, PTA 03/12/2018, 4:32 PM  [facility] [address] [place], [place], [address] Phone: [phone]   Fax:  [phone]  Name: Patricia Carr MRN: [id number] Date of Birth: 1956-07-12

## 2018-03-13 ENCOUNTER — Ambulatory Visit: Payer: Managed Care, Other (non HMO) | Admitting: Physical Therapy

## 2018-03-13 ENCOUNTER — Encounter: Payer: Self-pay | Admitting: Physical Therapy

## 2018-03-13 DIAGNOSIS — M25561 Pain in right knee: Secondary | ICD-10-CM | POA: Diagnosis not present

## 2018-03-13 DIAGNOSIS — M6281 Muscle weakness (generalized): Secondary | ICD-10-CM

## 2018-03-13 DIAGNOSIS — R600 Localized edema: Secondary | ICD-10-CM

## 2018-03-13 DIAGNOSIS — M25661 Stiffness of right knee, not elsewhere classified: Secondary | ICD-10-CM

## 2018-03-13 DIAGNOSIS — G8929 Other chronic pain: Secondary | ICD-10-CM

## 2018-03-13 NOTE — Therapy (Signed)
[facility] [place], [place], [address] Phone: [phone]   Fax:  [phone]  Physical Therapy Treatment  Patient Details  Name: Patricia Carr MRN: [id number] Date of Birth: 1956/09/05 Referring Provider: Gaynelle Arabian MD.   Encounter Date: 03/13/2018  PT End of Session - 03/13/18 1444    Visit Number  12    Number of Visits  18    Date for PT Re-Evaluation  04/12/18    PT Start Time  1025    PT Stop Time  1535    PT Time Calculation (min)  64 min    Activity Tolerance  Patient tolerated treatment well    Behavior During Therapy  [facility] for tasks assessed/performed       Past Medical History:  Diagnosis Date  . Anemia   . Bronchitis   . GERD (gastroesophageal reflux disease)   . Hypertension   . Joint pain   . Sinus complaint   . Sleep apnea   . Wears glasses     Past Surgical History:  Procedure Laterality Date  . COLONOSCOPY    . TUBAL LIGATION  1985    There were no vitals filed for this visit.  Subjective Assessment - 03/13/18 1431    Subjective  Reports that she is still having pain in R posterior knee. Reports she was unable to sleep well last night.    Pertinent History  Chronic knee pain.    How long can you walk comfortably?  Short distances within house.    Patient Stated Goals  Get out of pain and walk for exercise.    Currently in Pain?  Other (Comment) No pain assessment provided by patient         [facility] PT Assessment - 03/13/18 0001      Assessment   Medical Diagnosis  Right total knee replacement.    Onset Date/Surgical Date  02/13/18    Next MD Visit  03/20/2018      Restrictions   Weight Bearing Restrictions  No      ROM / Strength   AROM / PROM / Strength  AROM      AROM   Overall AROM   Deficits    AROM Assessment Site  Knee    Right/Left Knee  Right    Right Knee Extension  -4    Right Knee Flexion  111                   [facility] Adult PT  Treatment/Exercise - 03/13/18 0001      Knee/Hip Exercises: Aerobic   Stationary Bike  L4, seat 4 x15 min      Knee/Hip Exercises: Standing   Forward Lunges  Right;2 sets;10 reps;2 seconds    Other Standing Knee Exercises  Tandem rocking x20 reps, church pews x20 reps    Other Standing Knee Exercises  B toe raise x20 reps      Knee/Hip Exercises: Seated   Long Arc Quad  Strengthening;Right;Limitations    Long Arc Quad Limitations  VMS with LAQ      Knee/Hip Exercises: Prone   Other Prone Exercises  Prone R QS attempted but lack of voluntary activation      Modalities   Modalities  Electrical Stimulation;Vasopneumatic      Electrical Stimulation   Electrical Stimulation Location  R VMO/quad; R knee    Electrical Stimulation Action  VMS with LAQ; IFC    Electrical Stimulation Parameters  co-contract, 10/10,  5 sec ramp, 50 pps, 300 usec x15 min; 1-10 hz x15 min    Electrical Stimulation Goals  Neuromuscular facilitation;Edema      Vasopneumatic   Number Minutes Vasopneumatic   15 minutes    Vasopnuematic Location   Knee    Vasopneumatic Pressure  Medium    Vasopneumatic Temperature   34                  PT Long Term Goals - 03/05/18 1521      PT LONG TERM GOAL #1   Title  Ind with a HEP.    Time  4    Period  Weeks    Status  On-going      PT LONG TERM GOAL #2   Title  Full right active knee extension in order to normalize gait.    Status  On-going      PT LONG TERM GOAL #3   Title  Active knee flexion to 115 to 120 degrees+ so the patient can perform functional tasks and do so with pain not > 2-3/10.    Time  4    Period  Weeks    Status  On-going      PT LONG TERM GOAL #4   Title  Increase right knee strength to a solid 4+/5 to provide good stability for accomplishment of functional activities.    Time  4    Period  Weeks    Status  On-going      PT LONG TERM GOAL #5   Title  Perform a reciprocating stair gait with one railing with pain not >  2-3/10.    Time  4    Period  Weeks    Status  On-going            Plan - 03/13/18 1532    Clinical Impression Statement  Patient continues to demonstrate lack of R voluntary quad contraction even with the various quad activation exercises completed today in various positions. VMS with LAQ completed today to further push for neuro re-ed of R quad. R popliteal fossa observed minimally more swollen than L popliteal fossa. Patient worried regarding popping and grave type sensation in lateral R HS region. Normal modalities response noted following removal of the modalities.     Rehab Potential  Excellent    PT Frequency  3x / week    PT Duration  4 weeks    PT Treatment/Interventions  ADLs/Self Care Home Management;Cryotherapy;Occupational psychologist;Therapeutic activities;Therapeutic exercise;Neuromuscular re-education;Patient/family education;Passive range of motion;Manual techniques;Vasopneumatic Device    PT Next Visit Plan  cont with POC for TKA protocol.  Vaso and e' stim.    Consulted and Agree with Plan of Care  Patient       Patient will benefit from skilled therapeutic intervention in order to improve the following deficits and impairments:  Abnormal gait, Decreased activity tolerance, Decreased range of motion, Decreased strength, Increased edema, Pain  Visit Diagnosis: Chronic pain of right knee  Stiffness of right knee, not elsewhere classified  Muscle weakness (generalized)  Localized edema     Problem List Patient Active Problem List   Diagnosis Date Noted  . HTN (hypertension) 05/10/2011  . 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 GERD 12/17/2009  . NAUSEA WITH VOMITING 12/17/2009  . DYSPHAGIA UNSPECIFIED 12/17/2009    Standley Brooking, PTA 03/13/2018, 4:06 PM  [facility] [address] [place], [place], [address] Phone: [phone]  Fax:  [phone]  Name: Patricia Carr MRN: [id number] Date of Birth: February 23, 1956

## 2018-03-16 ENCOUNTER — Ambulatory Visit: Payer: Managed Care, Other (non HMO) | Admitting: *Deleted

## 2018-03-16 DIAGNOSIS — M25561 Pain in right knee: Principal | ICD-10-CM

## 2018-03-16 DIAGNOSIS — R600 Localized edema: Secondary | ICD-10-CM

## 2018-03-16 DIAGNOSIS — M25661 Stiffness of right knee, not elsewhere classified: Secondary | ICD-10-CM

## 2018-03-16 DIAGNOSIS — M6281 Muscle weakness (generalized): Secondary | ICD-10-CM

## 2018-03-16 DIAGNOSIS — G8929 Other chronic pain: Secondary | ICD-10-CM

## 2018-03-16 NOTE — Therapy (Signed)
[facility] [place], [place], [address] Phone: [phone]   Fax:  [phone]  Physical Therapy Treatment  Patient Details  Name: Patricia Carr MRN: [id number] Date of Birth: 01-22-1956 Referring Provider: Gaynelle Arabian MD.   Encounter Date: 03/16/2018  PT End of Session - 03/16/18 1201    Visit Number  13    Number of Visits  18    Date for PT Re-Evaluation  04/12/18    PT Start Time  1115    PT Stop Time  1214    PT Time Calculation (min)  59 min       Past Medical History:  Diagnosis Date  . Anemia   . Bronchitis   . GERD (gastroesophageal reflux disease)   . Hypertension   . Joint pain   . Sinus complaint   . Sleep apnea   . Wears glasses     Past Surgical History:  Procedure Laterality Date  . COLONOSCOPY    . TUBAL LIGATION  1985    There were no vitals filed for this visit.  Subjective Assessment - 03/16/18 1134    Subjective  Reports that she is still having pain in R posterior knee. Reports she was unable to sleep well last night. Did good after last Rx    Pertinent History  Chronic knee pain.    How long can you walk comfortably?  Short distances within house.    Patient Stated Goals  Get out of pain and walk for exercise.    Currently in Pain?  Yes    Pain Score  3     Pain Location  Knee    Pain Orientation  Right    Pain Descriptors / Indicators  Discomfort    Pain Type  Surgical pain    Pain Onset  1 to 4 weeks ago                       [facility] Adult PT Treatment/Exercise - 03/16/18 0001      Knee/Hip Exercises: Aerobic   Stationary Bike  L4, seat 4 x15 min      Knee/Hip Exercises: Machines for Strengthening   Cybex Leg Press  2 pl, seat 5, 3 x10 reps eccentric control      Knee/Hip Exercises: Seated   Long Arc Quad  Strengthening;Right;Limitations    Long Arc Quad Limitations  VMS with LAQ      Modalities   Presenter, broadcasting Location  R VMO/quad; R knee VMS 10 secs on/off x 10 mins with LAQ 3#    Electrical Stimulation Goals  Neuromuscular facilitation;Edema      Vasopneumatic   Number Minutes Vasopneumatic   15 minutes    Vasopnuematic Location   Knee    Vasopneumatic Pressure  Medium    Vasopneumatic Temperature   34                  PT Long Term Goals - 03/05/18 1521      PT LONG TERM GOAL #1   Title  Ind with a HEP.    Time  4    Period  Weeks    Status  On-going      PT LONG TERM GOAL #2   Title  Full right active knee extension in order to normalize gait.    Status  On-going      PT  LONG TERM GOAL #3   Title  Active knee flexion to 115 to 120 degrees+ so the patient can perform functional tasks and do so with pain not > 2-3/10.    Time  4    Period  Weeks    Status  On-going      PT LONG TERM GOAL #4   Title  Increase right knee strength to a solid 4+/5 to provide good stability for accomplishment of functional activities.    Time  4    Period  Weeks    Status  On-going      PT LONG TERM GOAL #5   Title  Perform a reciprocating stair gait with one railing with pain not > 2-3/10.    Time  4    Period  Weeks    Status  On-going            Plan - 03/16/18 1202    Clinical Impression Statement  Pt arrived today doing fairly well after last Rx and feels that her knee/quad control are getting better every day. She did better with Rx today and had a notable increase in quad control with no extensor lag  with SLR. Her PROM was 0-112 degrees. Pt still has concerns regarding a  popping sensation she gets in RT lateral HS region. Normal response to modalities today.    Clinical Presentation  Stable    Rehab Potential  Excellent    PT Frequency  3x / week    PT Duration  4 weeks    PT Treatment/Interventions  ADLs/Self Care Home Management;Cryotherapy;Occupational psychologist;Therapeutic  activities;Therapeutic exercise;Neuromuscular re-education;Patient/family education;Passive range of motion;Manual techniques;Vasopneumatic Device    PT Next Visit Plan  cont with POC for TKA protocol.  Vaso and e' stim.    Consulted and Agree with Plan of Care  Patient       Patient will benefit from skilled therapeutic intervention in order to improve the following deficits and impairments:  Abnormal gait, Decreased activity tolerance, Decreased range of motion, Decreased strength, Increased edema, Pain  Visit Diagnosis: Chronic pain of right knee  Stiffness of right knee, not elsewhere classified  Muscle weakness (generalized)  Localized edema     Problem List Patient Active Problem List   Diagnosis Date Noted  . HTN (hypertension) 05/10/2011  . 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 GERD 12/17/2009  . NAUSEA WITH VOMITING 12/17/2009  . DYSPHAGIA UNSPECIFIED 12/17/2009    RAMSEUR,CHRIS, PTA 03/16/2018, 12:25 PM Mali Applegate MPT [facility] [address] [place], [place], [address] Phone: [phone]   Fax:  [phone]  Name: ANALEE MONTEE MRN: [id number] Date of Birth: 18-Jul-1956

## 2018-03-21 ENCOUNTER — Ambulatory Visit: Payer: Managed Care, Other (non HMO) | Admitting: Physical Therapy

## 2018-03-21 ENCOUNTER — Encounter: Payer: Self-pay | Admitting: Physical Therapy

## 2018-03-21 DIAGNOSIS — R600 Localized edema: Secondary | ICD-10-CM

## 2018-03-21 DIAGNOSIS — M25561 Pain in right knee: Secondary | ICD-10-CM | POA: Diagnosis not present

## 2018-03-21 DIAGNOSIS — M6281 Muscle weakness (generalized): Secondary | ICD-10-CM

## 2018-03-21 DIAGNOSIS — M25661 Stiffness of right knee, not elsewhere classified: Secondary | ICD-10-CM

## 2018-03-21 DIAGNOSIS — G8929 Other chronic pain: Secondary | ICD-10-CM

## 2018-03-21 NOTE — Therapy (Signed)
[facility] [place], [place], [address] Phone: [phone]   Fax:  [phone]  Physical Therapy Treatment  Patient Details  Name: Patricia Carr MRN: [id number] Date of Birth: 1956/06/01 Referring Provider: Gaynelle Arabian MD.   Encounter Date: 03/21/2018  PT End of Session - 03/21/18 1430    Visit Number  14    Number of Visits  18    Date for PT Re-Evaluation  04/12/18    PT Start Time  0938    PT Stop Time  1528    PT Time Calculation (min)  60 min    Activity Tolerance  Patient tolerated treatment well    Behavior During Therapy  [facility] for tasks assessed/performed       Past Medical History:  Diagnosis Date  . Anemia   . Bronchitis   . GERD (gastroesophageal reflux disease)   . Hypertension   . Joint pain   . Sinus complaint   . Sleep apnea   . Wears glasses     Past Surgical History:  Procedure Laterality Date  . COLONOSCOPY    . TUBAL LIGATION  1985    There were no vitals filed for this visit.  Subjective Assessment - 03/21/18 1429    Subjective  Reports that she got a good report from the MD yesterday.    Pertinent History  Chronic knee pain.    How long can you walk comfortably?  Short distances within house.    Patient Stated Goals  Get out of pain and walk for exercise.    Currently in Pain?  No/denies         [facility] PT Assessment - 03/21/18 0001      Assessment   Medical Diagnosis  Right total knee replacement.    Onset Date/Surgical Date  02/13/18    Next MD Visit  03/20/2018      Restrictions   Weight Bearing Restrictions  No                   [facility] Adult PT Treatment/Exercise - 03/21/18 0001      Knee/Hip Exercises: Aerobic   Stationary Bike  L4, seat 4 x15 min      Knee/Hip Exercises: Machines for Strengthening   Cybex Leg Press  2 pl, seat 5, 2x10 reps eccentric control      Knee/Hip Exercises: Standing   Forward Step Up  Right;2 sets;10 reps;Hand Hold: 2;Step  Height: 6"    Step Down  Right;2 sets;10 reps;Hand Hold: 2;Step Height: 6"      Knee/Hip Exercises: Seated   Long Arc Quad  Right;Limitations    Long Arc Quad Limitations  VMS with LAQ 3#      Modalities   Modalities  Artist  R VMO/Quad; R knee    Printmaker Action  VMS with LAQ 3#; IFC    Electrical Stimulation Parameters  10/10, 50 pps, 5 sec ramp, 300 usec x10 min; 1-10 hz x15 m    Electrical Stimulation Goals  Neuromuscular facilitation;Edema      Vasopneumatic   Number Minutes Vasopneumatic   15 minutes    Vasopnuematic Location   Knee    Vasopneumatic Pressure  Medium    Vasopneumatic Temperature   34                  PT Long Term Goals - 03/05/18 1521  PT LONG TERM GOAL #1   Title  Ind with a HEP.    Time  4    Period  Weeks    Status  On-going      PT LONG TERM GOAL #2   Title  Full right active knee extension in order to normalize gait.    Status  On-going      PT LONG TERM GOAL #3   Title  Active knee flexion to 115 to 120 degrees+ so the patient can perform functional tasks and do so with pain not > 2-3/10.    Time  4    Period  Weeks    Status  On-going      PT LONG TERM GOAL #4   Title  Increase right knee strength to a solid 4+/5 to provide good stability for accomplishment of functional activities.    Time  4    Period  Weeks    Status  On-going      PT LONG TERM GOAL #5   Title  Perform a reciprocating stair gait with one railing with pain not > 2-3/10.    Time  4    Period  Weeks    Status  On-going            Plan - 03/21/18 1458    Clinical Impression Statement  Patient tolerated today's treatment well with minimally increased R quad activiation noted today. More focus on strengthening and eccentric control with step ups. Patient reported more discomfort with descending the 6" step. VMS to R quad with LAQ completed  again today with no complaints from patient. Normal modalities response noted following removal of the modalities.    Rehab Potential  Excellent    PT Frequency  3x / week    PT Duration  4 weeks    PT Treatment/Interventions  ADLs/Self Care Home Management;Cryotherapy;Occupational psychologist;Therapeutic activities;Therapeutic exercise;Neuromuscular re-education;Patient/family education;Passive range of motion;Manual techniques;Vasopneumatic Device    PT Next Visit Plan  cont with POC for TKA protocol.  Vaso and e' stim.    Consulted and Agree with Plan of Care  Patient       Patient will benefit from skilled therapeutic intervention in order to improve the following deficits and impairments:  Abnormal gait, Decreased activity tolerance, Decreased range of motion, Decreased strength, Increased edema, Pain  Visit Diagnosis: Chronic pain of right knee  Stiffness of right knee, not elsewhere classified  Muscle weakness (generalized)  Localized edema     Problem List Patient Active Problem List   Diagnosis Date Noted  . HTN (hypertension) 05/10/2011  . 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 GERD 12/17/2009  . NAUSEA WITH VOMITING 12/17/2009  . DYSPHAGIA UNSPECIFIED 12/17/2009    Standley Brooking, PTA 03/21/2018, 3:40 PM  [facility] [address] [place], [place], [address] Phone: [phone]   Fax:  [phone]  Name: Patricia Carr MRN: [id number] Date of Birth: 08/28/1956

## 2018-03-22 ENCOUNTER — Encounter: Payer: Self-pay | Admitting: Physical Therapy

## 2018-03-22 ENCOUNTER — Ambulatory Visit: Payer: Managed Care, Other (non HMO) | Admitting: Physical Therapy

## 2018-03-22 DIAGNOSIS — M6281 Muscle weakness (generalized): Secondary | ICD-10-CM

## 2018-03-22 DIAGNOSIS — G8929 Other chronic pain: Secondary | ICD-10-CM

## 2018-03-22 DIAGNOSIS — M25561 Pain in right knee: Principal | ICD-10-CM

## 2018-03-22 DIAGNOSIS — R600 Localized edema: Secondary | ICD-10-CM

## 2018-03-22 DIAGNOSIS — M25661 Stiffness of right knee, not elsewhere classified: Secondary | ICD-10-CM

## 2018-03-22 NOTE — Therapy (Signed)
[facility] [place], [place], [address] Phone: [phone]   Fax:  [phone]  Physical Therapy Treatment  Patient Details  Name: Patricia Carr MRN: [id number] Date of Birth: 1956/10/19 Referring Provider: Gaynelle Arabian MD.   Encounter Date: 03/22/2018  PT End of Session - 03/22/18 1434    Visit Number  15    Number of Visits  18    Date for PT Re-Evaluation  04/12/18    PT Start Time  6237    PT Stop Time  1533    PT Time Calculation (min)  64 min    Activity Tolerance  Patient tolerated treatment well    Behavior During Therapy  [facility] for tasks assessed/performed       Past Medical History:  Diagnosis Date  . Anemia   . Bronchitis   . GERD (gastroesophageal reflux disease)   . Hypertension   . Joint pain   . Sinus complaint   . Sleep apnea   . Wears glasses     Past Surgical History:  Procedure Laterality Date  . COLONOSCOPY    . TUBAL LIGATION  1985    There were no vitals filed for this visit.  Subjective Assessment - 03/22/18 1431    Subjective  Reports that she had some stiffness this morning and just got back from [employer] to get floors and has some discomfort.    Pertinent History  Chronic knee pain.    How long can you walk comfortably?  Short distances within house.    Patient Stated Goals  Get out of pain and walk for exercise.    Currently in Pain?  Yes    Pain Score  2     Pain Location  Knee    Pain Orientation  Right    Pain Descriptors / Indicators  Discomfort    Pain Type  Surgical pain    Pain Onset  1 to 4 weeks ago    Pain Frequency  Intermittent         [facility] PT Assessment - 03/22/18 0001      Assessment   Medical Diagnosis  Right total knee replacement.    Onset Date/Surgical Date  02/13/18    Next MD Visit  04/24/2018 Returns to work 04/23/2018      Restrictions   Weight Bearing Restrictions  No                   [facility] Adult PT Treatment/Exercise - 03/22/18  0001      Knee/Hip Exercises: Aerobic   Stationary Bike  L4, seat 4 x15 min      Knee/Hip Exercises: Standing   Forward Step Up  Right;2 sets;10 reps;Hand Hold: 2;Step Height: 6"    Step Down  Right;2 sets;10 reps;Hand Hold: 2;Step Height: 6"    Other Standing Knee Exercises  Church pews x20 reps with toe raise    Other Standing Knee Exercises  Tandem rocking with R toe raise x20 reps      Knee/Hip Exercises: Seated   Long Arc Quad  Right;Limitations    Long Arc Quad Limitations  VMS with LAQ 4#      Modalities   Modalities  Psychologist, educational Location  R VMO/Quad; R knee    Electrical Stimulation Action  VMS with LAQ; IFC    Electrical Stimulation Parameters  Co-contract, 10/10, 50 pps, 5 sec ramp, 300 usec x10 min  Electrical Stimulation Goals  Neuromuscular facilitation;Edema      Vasopneumatic   Number Minutes Vasopneumatic   15 minutes    Vasopnuematic Location   Knee    Vasopneumatic Pressure  Medium    Vasopneumatic Temperature   34                  PT Long Term Goals - 03/05/18 1521      PT LONG TERM GOAL #1   Title  Ind with a HEP.    Time  4    Period  Weeks    Status  On-going      PT LONG TERM GOAL #2   Title  Full right active knee extension in order to normalize gait.    Status  On-going      PT LONG TERM GOAL #3   Title  Active knee flexion to 115 to 120 degrees+ so the patient can perform functional tasks and do so with pain not > 2-3/10.    Time  4    Period  Weeks    Status  On-going      PT LONG TERM GOAL #4   Title  Increase right knee strength to a solid 4+/5 to provide good stability for accomplishment of functional activities.    Time  4    Period  Weeks    Status  On-going      PT LONG TERM GOAL #5   Title  Perform a reciprocating stair gait with one railing with pain not > 2-3/10.    Time  4    Period  Weeks    Status  On-going             Plan - 03/22/18 1504    Clinical Impression Statement  Patient tolerated today's treatment well although still limited with R quad activation. Patient complained of R posterior knee pain with squats and step downs to 6" step. VMS continued to R quad and VMO with 4# resistance. Patient reported increased fatigue with 4# anklweight added to VMS/LAQ. Normal modalities response noted following removal of the modalities. 15th visit FOTO score     Rehab Potential  Excellent    PT Frequency  3x / week    PT Duration  4 weeks    PT Treatment/Interventions  ADLs/Self Care Home Management;Cryotherapy;Occupational psychologist;Therapeutic activities;Therapeutic exercise;Neuromuscular re-education;Patient/family education;Passive range of motion;Manual techniques;Vasopneumatic Device    PT Next Visit Plan  cont with POC for TKA protocol.  Vaso and e' stim.    Consulted and Agree with Plan of Care  Patient       Patient will benefit from skilled therapeutic intervention in order to improve the following deficits and impairments:  Abnormal gait, Decreased activity tolerance, Decreased range of motion, Decreased strength, Increased edema, Pain  Visit Diagnosis: Chronic pain of right knee  Stiffness of right knee, not elsewhere classified  Muscle weakness (generalized)  Localized edema     Problem List Patient Active Problem List   Diagnosis Date Noted  . HTN (hypertension) 05/10/2011  . 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 GERD 12/17/2009  . NAUSEA WITH VOMITING 12/17/2009  . DYSPHAGIA UNSPECIFIED 12/17/2009    Standley Brooking, PTA 03/22/2018, 4:33 PM  [facility] [address] [place], [place], [address] Phone: [phone]   Fax:  [phone]  Name: Patricia Carr MRN: [id number] Date of Birth: [birth year]

## 2018-03-30 ENCOUNTER — Ambulatory Visit: Payer: Managed Care, Other (non HMO) | Admitting: Physical Therapy

## 2018-03-30 ENCOUNTER — Encounter: Payer: Self-pay | Admitting: Physical Therapy

## 2018-03-30 DIAGNOSIS — M25661 Stiffness of right knee, not elsewhere classified: Secondary | ICD-10-CM

## 2018-03-30 DIAGNOSIS — G8929 Other chronic pain: Secondary | ICD-10-CM

## 2018-03-30 DIAGNOSIS — M6281 Muscle weakness (generalized): Secondary | ICD-10-CM

## 2018-03-30 DIAGNOSIS — M25561 Pain in right knee: Principal | ICD-10-CM

## 2018-03-30 DIAGNOSIS — R600 Localized edema: Secondary | ICD-10-CM

## 2018-03-30 NOTE — Therapy (Signed)
[facility] [place], [place], [address] Phone: [phone]   Fax:  [phone]  Physical Therapy Treatment  Patient Details  Name: Patricia Carr MRN: [id number] Date of Birth: [birth year] Referring Provider: Gaynelle Arabian MD.   Encounter Date: 03/30/2018  PT End of Session - 03/30/18 1119    Visit Number  16    Number of Visits  18    Date for PT Re-Evaluation  04/12/18    PT Start Time  1117    PT Stop Time  1221    PT Time Calculation (min)  64 min    Activity Tolerance  Patient tolerated treatment well    Behavior During Therapy  [facility] for tasks assessed/performed       Past Medical History:  Diagnosis Date  . Anemia   . Bronchitis   . GERD (gastroesophageal reflux disease)   . Hypertension   . Joint pain   . Sinus complaint   . Sleep apnea   . Wears glasses     Past Surgical History:  Procedure Laterality Date  . COLONOSCOPY    . TUBAL LIGATION  1985    There were no vitals filed for this visit.  Subjective Assessment - 03/30/18 1120    Subjective  Patient states she is doing really well. Rode recumbant bike x 20 min the other day.    Patient Stated Goals  Get out of pain and walk for exercise.    Currently in Pain?  Yes    Pain Score  1     Pain Location  Knee    Pain Orientation  Right    Pain Descriptors / Indicators  Sore    Pain Type  Surgical pain    Pain Onset  1 to 4 weeks ago    Pain Frequency  Intermittent    Aggravating Factors   going down stairs    Pain Relieving Factors  rest                       [facility] Adult PT Treatment/Exercise - 03/30/18 0001      Knee/Hip Exercises: Aerobic   Nustep  L6 X 15 MIN      Knee/Hip Exercises: Standing   Hip Abduction  Stengthening;Both;20 reps;Knee straight    Stairs  poor eccentric control on RLE    SLS  heel taps 2 inch step x 20; 4 inch heel tap off side 2x10    Other Standing Knee Exercises  Church pews x20 reps with toe  raise    Other Standing Knee Exercises  Tandem rocking with R toe raise x20 reps      Modalities   Modalities  Electrical Stimulation      Electrical Stimulation   Electrical Stimulation Location  R knee    Electrical Stimulation Action  IFC    Electrical Stimulation Parameters  TENS    Electrical Stimulation Goals  Pain      Vasopneumatic   Number Minutes Vasopneumatic   15 minutes    Vasopnuematic Location   Knee    Vasopneumatic Pressure  Medium    Vasopneumatic Temperature   34             PT Education - 03/30/18 1122    Education Details  Education on donning/doffing TENS unit    Person(s) Educated  Patient    Methods  Explanation;Demonstration;Handout    Comprehension  Verbalized understanding;Returned demonstration  PT Long Term Goals - 03/30/18 1122      PT LONG TERM GOAL #1   Title  Ind with a HEP.    Time  4    Status  Partially Met      PT LONG TERM GOAL #2   Title  Full right active knee extension in order to normalize gait.    Status  Achieved      PT LONG TERM GOAL #3   Title  Active knee flexion to 115 to 120 degrees+ so the patient can perform functional tasks and do so with pain not > 2-3/10.    Baseline  108 active/115 passive on 03/30/18    Status  Partially Met      PT LONG TERM GOAL #4   Title  Increase right knee strength to a solid 4+/5 to provide good stability for accomplishment of functional activities.    Time  4    Period  Weeks    Status  Achieved      PT LONG TERM GOAL #5   Title  Perform a reciprocating stair gait with one railing with pain not > 2-3/10.    Time  4    Period  Weeks    Status  Partially Met            Plan - 03/30/18 1211    Clinical Impression Statement  Patient progressing well wiht LTGs. She has eccentric quad weakness with stepping down. Pt educated on TENS with precautions and contraindications covered.    Rehab Potential  Excellent    PT Frequency  3x / week    PT Duration  4 weeks     PT Treatment/Interventions  ADLs/Self Care Home Management;Cryotherapy;Occupational psychologist;Therapeutic activities;Therapeutic exercise;Neuromuscular re-education;Patient/family education;Passive range of motion;Manual techniques;Vasopneumatic Device    PT Next Visit Plan  cont with POC for TKA protocol.  Vaso and e' stim.    Consulted and Agree with Plan of Care  Patient       Patient will benefit from skilled therapeutic intervention in order to improve the following deficits and impairments:  Abnormal gait, Decreased activity tolerance, Decreased range of motion, Decreased strength, Increased edema, Pain  Visit Diagnosis: Chronic pain of right knee  Stiffness of right knee, not elsewhere classified  Muscle weakness (generalized)  Localized edema     Problem List Patient Active Problem List   Diagnosis Date Noted  . HTN (hypertension) 05/10/2011  . 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 GERD 12/17/2009  . NAUSEA WITH VOMITING 12/17/2009  . DYSPHAGIA UNSPECIFIED 12/17/2009    Kaveri Perras PT 03/30/2018, 12:15 PM  [facility] [address] [place], [place], [address] Phone: [phone]   Fax:  [phone]  Name: Patricia Carr MRN: [id number] Date of Birth: February 08, 1956

## 2018-03-30 NOTE — Patient Instructions (Signed)
ABDUCTION: Standing (Active)   Stand, feet flat. Lift right leg out to side. Use _0__ lbs. Complete __10_ repetitions. Do 1-3 sets of 10.  Perform __2_ sessions per day.  Anterior heel tap    Stand with both feet on __4_ inch step. Tap left heel down  the floor and return 10_ times. Do not put weight through this heel. _1-3__ sets __1-2_ times per day.   Madelyn Flavors, PT 03/30/18 12:11 PM [facility] [address] [place], [place], [address] Phone: [phone]   Fax:  [phone]

## 2018-04-04 ENCOUNTER — Ambulatory Visit: Payer: Managed Care, Other (non HMO) | Attending: Orthopedic Surgery | Admitting: Physical Therapy

## 2018-04-04 ENCOUNTER — Encounter: Payer: Self-pay | Admitting: Physical Therapy

## 2018-04-04 DIAGNOSIS — R600 Localized edema: Secondary | ICD-10-CM | POA: Insufficient documentation

## 2018-04-04 DIAGNOSIS — G8929 Other chronic pain: Secondary | ICD-10-CM | POA: Diagnosis present

## 2018-04-04 DIAGNOSIS — M25661 Stiffness of right knee, not elsewhere classified: Secondary | ICD-10-CM | POA: Insufficient documentation

## 2018-04-04 DIAGNOSIS — M25561 Pain in right knee: Secondary | ICD-10-CM | POA: Insufficient documentation

## 2018-04-04 DIAGNOSIS — M6281 Muscle weakness (generalized): Secondary | ICD-10-CM | POA: Diagnosis present

## 2018-04-04 NOTE — Therapy (Signed)
[facility] [place], [place], [address] Phone: [phone]   Fax:  [phone]  Physical Therapy Treatment  Patient Details  Name: Patricia Carr MRN: [id number] Date of Birth: 08/08/1956 Referring Provider: Gaynelle Arabian MD.   Encounter Date: 04/04/2018  PT End of Session - 04/04/18 1437    Visit Number  17    Number of Visits  18    Date for PT Re-Evaluation  04/12/18    PT Start Time  8250    PT Stop Time  1528    PT Time Calculation (min)  56 min    Activity Tolerance  Patient tolerated treatment well    Behavior During Therapy  [facility] for tasks assessed/performed       Past Medical History:  Diagnosis Date  . Anemia   . Bronchitis   . GERD (gastroesophageal reflux disease)   . Hypertension   . Joint pain   . Sinus complaint   . Sleep apnea   . Wears glasses     Past Surgical History:  Procedure Laterality Date  . COLONOSCOPY    . TUBAL LIGATION  1985    There were no vitals filed for this visit.  Subjective Assessment - 04/04/18 1434    Subjective  Reports she goes back to work in 2 weeks. Reports that she has been sleeping better but if she overdoes activity then she pays for it later.    Pertinent History  Chronic knee pain.    How long can you walk comfortably?  Short distances within house.    Patient Stated Goals  Get out of pain and walk for exercise.    Currently in Pain?  Yes    Pain Score  2     Pain Location  Knee    Pain Orientation  Right    Pain Descriptors / Indicators  Discomfort    Pain Type  Surgical pain    Pain Onset  1 to 4 weeks ago         [facility] PT Assessment - 04/04/18 0001      Assessment   Medical Diagnosis  Right total knee replacement.    Onset Date/Surgical Date  02/13/18    Next MD Visit  04/24/2018      Restrictions   Weight Bearing Restrictions  No                   [facility] Adult PT Treatment/Exercise - 04/04/18 0001      Knee/Hip Exercises:  Aerobic   Stationary Bike  L4, seat 3 x15 min      Knee/Hip Exercises: Machines for Strengthening   Cybex Knee Extension  10# 3x10 reps    Cybex Knee Flexion  40# 3x10 reps    Cybex Leg Press  2 pl, seat 6 x20 reps      Knee/Hip Exercises: Standing   Hip Abduction  Stengthening;Both;20 reps;Knee straight    Abduction Limitations  Red theraband    Step Down  Right;2 sets;10 reps;Hand Hold: 2;Step Height: 6"    Other Standing Knee Exercises  L heel dots 4" step x20 reps      Modalities   Modalities  Electrical Stimulation;Vasopneumatic      Electrical Stimulation   Electrical Stimulation Location  R knee    Electrical Stimulation Action  IFC    Electrical Stimulation Parameters  1-10 hz x15 min    Electrical Stimulation Goals  Pain      Vasopneumatic  Number Minutes Vasopneumatic   15 minutes    Vasopnuematic Location   Knee    Vasopneumatic Pressure  Medium    Vasopneumatic Temperature   34                  PT Long Term Goals - 03/30/18 1122      PT LONG TERM GOAL #1   Title  Ind with a HEP.    Time  4    Status  Partially Met      PT LONG TERM GOAL #2   Title  Full right active knee extension in order to normalize gait.    Status  Achieved      PT LONG TERM GOAL #3   Title  Active knee flexion to 115 to 120 degrees+ so the patient can perform functional tasks and do so with pain not > 2-3/10.    Baseline  108 active/115 passive on 03/30/18    Status  Partially Met      PT LONG TERM GOAL #4   Title  Increase right knee strength to a solid 4+/5 to provide good stability for accomplishment of functional activities.    Time  4    Period  Weeks    Status  Achieved      PT LONG TERM GOAL #5   Title  Perform a reciprocating stair gait with one railing with pain not > 2-3/10.    Time  4    Period  Weeks    Status  Partially Met            Plan - 04/04/18 1531    Clinical Impression Statement  Patient tolerated today's treatment well with continued  episodes of pain in R knee. Patient able to complete strengthening exercises without complaint of pain. Patient continues to report fear in descending stairs and not being able to complete as natural. 6" step attempted with step downs but patient reported discomfort down R shin region and step height decreased to 4". Normal modalities response noted following removal of the modalities.    Rehab Potential  Excellent    PT Frequency  3x / week    PT Duration  4 weeks    PT Treatment/Interventions  ADLs/Self Care Home Management;Cryotherapy;Occupational psychologist;Therapeutic activities;Therapeutic exercise;Neuromuscular re-education;Patient/family education;Passive range of motion;Manual techniques;Vasopneumatic Device    PT Next Visit Plan  cont with POC for TKA protocol.  Vaso and e' stim.    Consulted and Agree with Plan of Care  Patient       Patient will benefit from skilled therapeutic intervention in order to improve the following deficits and impairments:  Abnormal gait, Decreased activity tolerance, Decreased range of motion, Decreased strength, Increased edema, Pain  Visit Diagnosis: Chronic pain of right knee  Stiffness of right knee, not elsewhere classified  Muscle weakness (generalized)  Localized edema     Problem List Patient Active Problem List   Diagnosis Date Noted  . HTN (hypertension) 05/10/2011  . 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 GERD 12/17/2009  . NAUSEA WITH VOMITING 12/17/2009  . DYSPHAGIA UNSPECIFIED 12/17/2009    Standley Brooking, PTA 04/04/2018, 3:36 PM  [facility] [address] [place], [place], [address] Phone: [phone]   Fax:  [phone]  Name: Patricia Carr MRN: [id number] Date of Birth: [birth year]

## 2018-04-05 ENCOUNTER — Ambulatory Visit: Payer: Managed Care, Other (non HMO) | Admitting: Physical Therapy

## 2018-04-05 ENCOUNTER — Encounter: Payer: Self-pay | Admitting: Physical Therapy

## 2018-04-05 DIAGNOSIS — M25561 Pain in right knee: Secondary | ICD-10-CM | POA: Diagnosis not present

## 2018-04-05 DIAGNOSIS — G8929 Other chronic pain: Secondary | ICD-10-CM

## 2018-04-05 DIAGNOSIS — M25661 Stiffness of right knee, not elsewhere classified: Secondary | ICD-10-CM

## 2018-04-05 DIAGNOSIS — M6281 Muscle weakness (generalized): Secondary | ICD-10-CM

## 2018-04-05 DIAGNOSIS — R600 Localized edema: Secondary | ICD-10-CM

## 2018-04-05 NOTE — Therapy (Signed)
[facility] [place], [place], [address] Phone: [phone]   Fax:  [phone]  Physical Therapy Treatment  Patient Details  Name: Patricia Carr MRN: [id number] Date of Birth: June 27, 1956 Referring Provider: Gaynelle Arabian MD.   Encounter Date: 04/05/2018  PT End of Session - 04/05/18 1436    Visit Number  18    Number of Visits  18    Date for PT Re-Evaluation  04/12/18    PT Start Time  1430    PT Stop Time  1524    PT Time Calculation (min)  54 min    Activity Tolerance  Patient tolerated treatment well    Behavior During Therapy  [facility] for tasks assessed/performed       Past Medical History:  Diagnosis Date  . Anemia   . Bronchitis   . GERD (gastroesophageal reflux disease)   . Hypertension   . Joint pain   . Sinus complaint   . Sleep apnea   . Wears glasses     Past Surgical History:  Procedure Laterality Date  . COLONOSCOPY    . TUBAL LIGATION  1985    There were no vitals filed for this visit.  Subjective Assessment - 04/05/18 1432    Subjective  Reports that sometimes she feels as if her scar tightens up. "I have to remember I won't be better overnight."    Pertinent History  Chronic knee pain.    How long can you walk comfortably?  Short distances within house.    Patient Stated Goals  Get out of pain and walk for exercise.    Currently in Pain?  Other (Comment) No pain assessment provided by patient         [facility] PT Assessment - 04/05/18 0001      Assessment   Medical Diagnosis  Right total knee replacement.    Onset Date/Surgical Date  02/13/18    Next MD Visit  04/24/2018      Restrictions   Weight Bearing Restrictions  No      ROM / Strength   AROM / PROM / Strength  AROM      AROM   Overall AROM   Within functional limits for tasks performed    AROM Assessment Site  Knee    Right/Left Knee  Right    Right Knee Extension  -2    Right Knee Flexion  122                    [facility] Adult PT Treatment/Exercise - 04/05/18 0001      Knee/Hip Exercises: Aerobic   Stationary Bike  L4, seat 3 x15 min 4 miles      Knee/Hip Exercises: Machines for Strengthening   Cybex Knee Extension  10# 3x10 reps    Cybex Knee Flexion  40# 3x10 reps    Cybex Leg Press  2 pl, seat 6 x20 reps      Knee/Hip Exercises: Standing   Wall Squat  15 reps;3 seconds      Modalities   Modalities  Electrical Stimulation;Vasopneumatic      Electrical Stimulation   Electrical Stimulation Location  R knee    Electrical Stimulation Action  IFC    Electrical Stimulation Parameters  1-10 hz x15 min    Electrical Stimulation Goals  Pain      Vasopneumatic   Number Minutes Vasopneumatic   15 minutes    Vasopnuematic Location   Knee  Vasopneumatic Pressure  Low    Vasopneumatic Temperature   34      Manual Therapy   Manual Therapy  Soft tissue mobilization    Soft tissue mobilization  R incision mobilizations with education to patient regarding technique for home                   PT Long Term Goals - 04/05/18 1604      PT LONG TERM GOAL #1   Title  Ind with a HEP.    Time  4    Status  Partially Met      PT LONG TERM GOAL #2   Title  Full right active knee extension in order to normalize gait.    Status  Achieved      PT LONG TERM GOAL #3   Title  Active knee flexion to 115 to 120 degrees+ so the patient can perform functional tasks and do so with pain not > 2-3/10.    Status  Achieved AROM R knee flexion 122 deg 04/05/2018      PT LONG TERM GOAL #4   Title  Increase right knee strength to a solid 4+/5 to provide good stability for accomplishment of functional activities.    Time  4    Period  Weeks    Status  Achieved      PT LONG TERM GOAL #5   Title  Perform a reciprocating stair gait with one railing with pain not > 2-3/10.    Time  4    Period  Weeks    Status  Partially Met            Plan - 04/05/18 1547    Clinical  Impression Statement  Patient tolerated today's treatment well with only complaints of tightness intermittantly surrounding the R inicison. Patient progressed with weightbearing and machine knee strengthening and instruction for eccentric control. PTA instructed and demonstrated techniques for R incision mobilizations as well as use of vitamin E lotion or scar ointment for scar mobilizations to reduce tightness. Normal modalities response noted following removal of the modalities.    Rehab Potential  Excellent    PT Frequency  3x / week    PT Duration  4 weeks    PT Treatment/Interventions  ADLs/Self Care Home Management;Cryotherapy;Occupational psychologist;Therapeutic activities;Therapeutic exercise;Neuromuscular re-education;Patient/family education;Passive range of motion;Manual techniques;Vasopneumatic Device    PT Next Visit Plan  cont with POC for TKA protocol.  Vaso and e' stim.    Consulted and Agree with Plan of Care  Patient       Patient will benefit from skilled therapeutic intervention in order to improve the following deficits and impairments:  Abnormal gait, Decreased activity tolerance, Decreased range of motion, Decreased strength, Increased edema, Pain  Visit Diagnosis: Chronic pain of right knee  Stiffness of right knee, not elsewhere classified  Muscle weakness (generalized)  Localized edema     Problem List Patient Active Problem List   Diagnosis Date Noted  . HTN (hypertension) 05/10/2011  . 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 GERD 12/17/2009  . NAUSEA WITH VOMITING 12/17/2009  . DYSPHAGIA UNSPECIFIED 12/17/2009    Standley Brooking, PTA 04/05/2018, 4:06 PM  [facility] [address] [place], [place], [address] Phone: [phone]   Fax:  [phone]  Name: BANESA TRISTAN MRN: [id number] Date of Birth: 05-24-1956

## 2018-04-10 ENCOUNTER — Encounter: Payer: Self-pay | Admitting: Physical Therapy

## 2018-04-10 ENCOUNTER — Ambulatory Visit: Payer: Managed Care, Other (non HMO) | Admitting: Physical Therapy

## 2018-04-10 DIAGNOSIS — M25561 Pain in right knee: Secondary | ICD-10-CM | POA: Diagnosis not present

## 2018-04-10 DIAGNOSIS — M25661 Stiffness of right knee, not elsewhere classified: Secondary | ICD-10-CM

## 2018-04-10 DIAGNOSIS — R600 Localized edema: Secondary | ICD-10-CM

## 2018-04-10 DIAGNOSIS — G8929 Other chronic pain: Secondary | ICD-10-CM

## 2018-04-10 DIAGNOSIS — M6281 Muscle weakness (generalized): Secondary | ICD-10-CM

## 2018-04-10 NOTE — Therapy (Signed)
[facility] [place], [place], [address] Phone: [phone]   Fax:  [phone]  Physical Therapy Treatment  Patient Details  Name: Patricia Carr MRN: [id number] Date of Birth: [birth year] Referring Provider: Gaynelle Arabian MD.   Encounter Date: 04/10/2018  PT End of Session - 04/10/18 1433    Visit Number  19    Number of Visits  18    Date for PT Re-Evaluation  04/12/18    PT Start Time  7121    PT Stop Time  1528    PT Time Calculation (min)  57 min    Activity Tolerance  Patient tolerated treatment well    Behavior During Therapy  [facility] for tasks assessed/performed       Past Medical History:  Diagnosis Date  . Anemia   . Bronchitis   . GERD (gastroesophageal reflux disease)   . Hypertension   . Joint pain   . Sinus complaint   . Sleep apnea   . Wears glasses     Past Surgical History:  Procedure Laterality Date  . COLONOSCOPY    . TUBAL LIGATION  1985    There were no vitals filed for this visit.  Subjective Assessment - 04/10/18 1432    Subjective  Reports that the scar mobs have really helped.    Pertinent History  Chronic knee pain.    How long can you walk comfortably?  Short distances within house.    Patient Stated Goals  Get out of pain and walk for exercise.    Currently in Pain?  Other (Comment) No pain assessment provided by patient         [facility] PT Assessment - 04/10/18 0001      Assessment   Medical Diagnosis  Right total knee replacement.    Onset Date/Surgical Date  02/13/18    Next MD Visit  04/24/2018      Restrictions   Weight Bearing Restrictions  No                   [facility] Adult PT Treatment/Exercise - 04/10/18 0001      Ambulation/Gait   Stairs  Yes    Stairs Assistance  7: Independent    Stair Management Technique  One rail Right;Alternating pattern;Forwards    Number of Stairs  4 x2 RT    Height of Stairs  6.5      Knee/Hip Exercises: Aerobic    Stationary Bike  L4, seat 3 x15 min      Knee/Hip Exercises: Machines for Strengthening   Cybex Knee Extension  20# 2x10 reps    Cybex Knee Flexion  50# 2x10 reps    Cybex Leg Press  2 pl, seat 6 x20 reps with ball squeeze      Knee/Hip Exercises: Standing   Wall Squat  20 reps    Rocker Board  2 minutes balance    SLS  LLE SLS for cone touch to waist height     Other Standing Knee Exercises  BOSU DLS for balance x3 min      Modalities   Modalities  Electrical Stimulation;Vasopneumatic      Electrical Stimulation   Electrical Stimulation Location  R knee    Electrical Stimulation Action  IFC    Electrical Stimulation Parameters  1-10 hz x15 min    Electrical Stimulation Goals  Pain      Vasopneumatic   Number Minutes Vasopneumatic   15 minutes  Vasopnuematic Location   Knee    Vasopneumatic Pressure  Low    Vasopneumatic Temperature   34                  PT Long Term Goals - 04/05/18 1604      PT LONG TERM GOAL #1   Title  Ind with a HEP.    Time  4    Status  Partially Met      PT LONG TERM GOAL #2   Title  Full right active knee extension in order to normalize gait.    Status  Achieved      PT LONG TERM GOAL #3   Title  Active knee flexion to 115 to 120 degrees+ so the patient can perform functional tasks and do so with pain not > 2-3/10.    Status  Achieved AROM R knee flexion 122 deg 04/05/2018      PT LONG TERM GOAL #4   Title  Increase right knee strength to a solid 4+/5 to provide good stability for accomplishment of functional activities.    Time  4    Period  Weeks    Status  Achieved      PT LONG TERM GOAL #5   Title  Perform a reciprocating stair gait with one railing with pain not > 2-3/10.    Time  4    Period  Weeks    Status  Partially Met            Plan - 04/10/18 1614    Clinical Impression Statement  Patient tolerated today's treatment well with improvement in discomfort from scar mobilizations. Patient able to complete  exercises as directed with only complaint of discomfort with wall slides. Patient able to ascend and descend stairs well although more hesistant with descending stairs but patient reports she still has a fear of descending stairs with instability. Instability of LLE noted with SLS activities as well uneven surfaces such as rockerboard and BOSU. Normal modalities response noted following removal of the modalities.    Rehab Potential  Excellent    PT Frequency  3x / week    PT Duration  4 weeks    PT Treatment/Interventions  ADLs/Self Care Home Management;Cryotherapy;Occupational psychologist;Therapeutic activities;Therapeutic exercise;Neuromuscular re-education;Patient/family education;Passive range of motion;Manual techniques;Vasopneumatic Device    PT Next Visit Plan  cont with POC for TKA protocol.  Vaso and e' stim.    Consulted and Agree with Plan of Care  Patient       Patient will benefit from skilled therapeutic intervention in order to improve the following deficits and impairments:  Abnormal gait, Decreased activity tolerance, Decreased range of motion, Decreased strength, Increased edema, Pain  Visit Diagnosis: Chronic pain of right knee  Stiffness of right knee, not elsewhere classified  Muscle weakness (generalized)  Localized edema     Problem List Patient Active Problem List   Diagnosis Date Noted  . HTN (hypertension) 05/10/2011  . 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 GERD 12/17/2009  . NAUSEA WITH VOMITING 12/17/2009  . DYSPHAGIA UNSPECIFIED 12/17/2009    Standley Brooking, PTA 04/10/2018, 4:39 PM  [facility] [address] [place], [place], [address] Phone: [phone]   Fax:  [phone]  Name: Patricia Carr MRN: [id number] Date of Birth: 16-Jan-1956

## 2018-04-12 ENCOUNTER — Encounter: Payer: Self-pay | Admitting: Physical Therapy

## 2018-04-12 ENCOUNTER — Ambulatory Visit: Payer: Managed Care, Other (non HMO) | Admitting: Physical Therapy

## 2018-04-12 DIAGNOSIS — R600 Localized edema: Secondary | ICD-10-CM

## 2018-04-12 DIAGNOSIS — M25561 Pain in right knee: Principal | ICD-10-CM

## 2018-04-12 DIAGNOSIS — G8929 Other chronic pain: Secondary | ICD-10-CM

## 2018-04-12 DIAGNOSIS — M25661 Stiffness of right knee, not elsewhere classified: Secondary | ICD-10-CM

## 2018-04-12 DIAGNOSIS — M6281 Muscle weakness (generalized): Secondary | ICD-10-CM

## 2018-04-12 NOTE — Therapy (Addendum)
[facility] [place], [place], [address] Phone: [phone]   Fax:  [phone]  Physical Therapy Treatment Discharge Summary  Patient Details  Name: Patricia Carr MRN: [id number] Date of Birth: 04/13/1956 Referring Provider: Maureen Ralphs   Encounter Date: 04/12/2018  PT End of Session - 04/12/18 1132    Visit Number  20    Date for PT Re-Evaluation  04/12/18    PT Start Time  1115    PT Stop Time  1200    PT Time Calculation (min)  45 min    Activity Tolerance  Patient tolerated treatment well    Behavior During Therapy  [facility] for tasks assessed/performed       Past Medical History:  Diagnosis Date  . Anemia   . Bronchitis   . GERD (gastroesophageal reflux disease)   . Hypertension   . Joint pain   . Sinus complaint   . Sleep apnea   . Wears glasses     Past Surgical History:  Procedure Laterality Date  . COLONOSCOPY    . TUBAL LIGATION  1985    There were no vitals filed for this visit.  Subjective Assessment - 04/12/18 1129    Subjective  Pt reporting today is her last visit    Currently in Pain?  No/denies    Pain Orientation  Right    Pain Type  Surgical pain    Pain Onset  1 to 4 weeks ago    Aggravating Factors   going down stairs    Pain Relieving Factors  resting         [facility] PT Assessment - 04/12/18 0001      Assessment   Medical Diagnosis  Right total knee replacement.    Referring Provider  Alusio    Onset Date/Surgical Date  02/13/18    Next MD Visit  04/24/2018      Restrictions   Weight Bearing Restrictions  No      Observation/Other Assessments   Focus on Therapeutic Outcomes (FOTO)   17% limitation      ROM / Strength   AROM / PROM / Strength  AROM      AROM   AROM Assessment Site  Knee    Right/Left Knee  Right    Right Knee Extension  -4    Right Knee Flexion  122                           PT Education - 04/12/18 1130    Education Details   reviewed HEP    Person(s) Educated  Patient    Methods  Explanation    Comprehension  Verbalized understanding          PT Long Term Goals - 04/12/18 1133      PT LONG TERM GOAL #1   Title  Ind with a HEP.    Time  4    Period  Weeks    Status  Achieved      PT LONG TERM GOAL #2   Title  Full right active knee extension in order to normalize gait.    Status  Achieved      PT LONG TERM GOAL #3   Title  Active knee flexion to 115 to 120 degrees+ so the patient can perform functional tasks and do so with pain not > 2-3/10.    Baseline  AROM 115 degrees    Time  4  Period  Weeks    Status  Achieved      PT LONG TERM GOAL #4   Title  Increase right knee strength to a solid 4+/5 to provide good stability for accomplishment of functional activities.    Time  4    Period  Weeks    Status  Achieved      PT LONG TERM GOAL #5   Title  Perform a reciprocating stair gait with one railing with pain not > 2-3/10.    Time  4    Period  Weeks    Status  Achieved            Plan - 04/12/18 1158    Clinical Impression Statement  Pt arriving today reporting today was her last visit. Pt has met all her goals set at initial evalaution. Pt's current AROM is -4 to 122 degrees. Pt is independent in her HEP. Pt's FOTO score dropped from 82% limitation to 17% limitation.   Pt will be discharged from PT today.     Rehab Potential  Excellent    PT Frequency  3x / week    PT Duration  4 weeks    PT Treatment/Interventions  ADLs/Self Care Home Management;Cryotherapy;Occupational psychologist;Therapeutic activities;Therapeutic exercise;Neuromuscular re-education;Patient/family education;Passive range of motion;Manual techniques;Vasopneumatic Device    PT Next Visit Plan  Pt discharged today    Consulted and Agree with Plan of Care  Patient       Patient will benefit from skilled therapeutic intervention in order to improve the following deficits and impairments:   Abnormal gait, Decreased activity tolerance, Decreased range of motion, Decreased strength, Increased edema, Pain  Visit Diagnosis: Chronic pain of right knee  Stiffness of right knee, not elsewhere classified  Muscle weakness (generalized)  Localized edema  PHYSICAL THERAPY DISCHARGE SUMMARY  Visits from Start of Care: 20  Current functional level related to goals / functional outcomes: See above   Remaining deficits: See above   Education / Equipment: HEP Plan: Patient agrees to discharge.  Patient goals were met. Patient is being discharged due to meeting the stated rehab goals.  ?????        Problem List Patient Active Problem List   Diagnosis Date Noted  . HTN (hypertension) 05/10/2011  . 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 GERD 12/17/2009  . NAUSEA WITH VOMITING 12/17/2009  . DYSPHAGIA UNSPECIFIED 12/17/2009    Oretha Caprice, MPT 04/12/2018, 12:26 PM  [facility] [place], [place], [address] Phone: [phone]   Fax:  [phone]  Name: Patricia Carr MRN: [id number] Date of Birth: [birth year]

## 2018-05-01 ENCOUNTER — Encounter: Payer: Self-pay | Admitting: Physician Assistant

## 2018-05-01 ENCOUNTER — Ambulatory Visit (INDEPENDENT_AMBULATORY_CARE_PROVIDER_SITE_OTHER): Payer: Managed Care, Other (non HMO) | Admitting: Physician Assistant

## 2018-05-01 VITALS — BP 145/80 | HR 57 | Temp 97.5°F | Ht 63.0 in | Wt 198.6 lb

## 2018-05-01 DIAGNOSIS — F3341 Major depressive disorder, recurrent, in partial remission: Secondary | ICD-10-CM | POA: Insufficient documentation

## 2018-05-01 DIAGNOSIS — Z9884 Bariatric surgery status: Secondary | ICD-10-CM

## 2018-05-01 DIAGNOSIS — F339 Major depressive disorder, recurrent, unspecified: Secondary | ICD-10-CM | POA: Diagnosis not present

## 2018-05-01 DIAGNOSIS — R41840 Attention and concentration deficit: Secondary | ICD-10-CM | POA: Diagnosis not present

## 2018-05-01 DIAGNOSIS — K219 Gastro-esophageal reflux disease without esophagitis: Secondary | ICD-10-CM

## 2018-05-01 DIAGNOSIS — F3342 Major depressive disorder, recurrent, in full remission: Secondary | ICD-10-CM | POA: Insufficient documentation

## 2018-05-01 DIAGNOSIS — I10 Essential (primary) hypertension: Secondary | ICD-10-CM

## 2018-05-01 DIAGNOSIS — F331 Major depressive disorder, recurrent, moderate: Secondary | ICD-10-CM | POA: Insufficient documentation

## 2018-05-01 MED ORDER — AMPHETAMINE-DEXTROAMPHET ER 20 MG PO CP24: 20.0000 mg | ORAL_CAPSULE | ORAL | 0 refills | Status: DC

## 2018-05-01 MED ORDER — ICOSAPENT ETHYL 1 G PO CAPS: 2.0000 g | ORAL_CAPSULE | Freq: Two times a day (BID) | ORAL | 3 refills | Status: DC

## 2018-05-01 NOTE — Progress Notes (Signed)
BP (!) 145/80   Pulse (!) 57   Temp (!) 97.5 F (36.4 C) (Oral)   Ht 5\' 3"  (1.6 m)   Wt 198 lb 9.6 oz (90.1 kg)   BMI 35.18 kg/m    Subjective:    Patient ID: Patricia Carr, female    DOB: 1955/11/27, 62 y.o.   MRN: [id number]  HPI: Patricia Carr is a 62 y.o. female presenting on 05/01/2018 for New Patient (Initial Visit) and Establish Care  This patient comes in to be established with our practice.  I had seen her  a few years ago.  She does have a provider at her work and does get her wellnes and small problems handled through her.  We have reviewed all of her current medications.  Her labs were very good at her last visit.  She did have knee replacement of her right knee earlier this year.  She was seen by Dr. Wynelle Link.  She has done very well and is completing her physical therapy at this time.  We have reviewed some of her wellness items and she is up-to-date on most things.  We have discussed the possibility of using vascepa in place of her niacin.  She has a very strong family history of coronary artery disease.  She would like to start the medication we will send a prescription to her pharmacy.  We have also discussed the possibility of starting something for her attention deficit disorder.  She has had long-term issues with this.  We have tried Wellbutrin in the past without much relief.  She has to check and recheck things at work and frequently gets very far behind on her work.  She has a very detailed oriented job.   Past Medical History:  Diagnosis Date  . Anemia   . Anxiety   . Bronchitis   . Depression   . GERD (gastroesophageal reflux disease)   . Hypertension   . Joint pain   . Sinus complaint   . Sleep apnea   . Wears glasses    Relevant past medical, surgical, family and social history reviewed and updated as indicated. Interim medical history since our last visit reviewed. Allergies and medications reviewed and updated. DATA REVIEWED: CHART IN  EPIC  Family History reviewed for pertinent findings.  Review of Systems  Allergies as of 05/01/2018      Reactions   Iodine    REACTION: rash      Medication List        Accurate as of 05/01/18 11:59 PM. Always use your most recent med list.          amphetamine-dextroamphetamine 20 MG 24 hr capsule Commonly known as:  ADDERALL XR Take 1 capsule (20 mg total) by mouth every morning.   aspirin 81 MG tablet Take 81 mg by mouth daily.   Biotin 1000 MCG tablet Take 1,000 mcg by mouth daily.   buPROPion 300 MG 24 hr tablet Commonly known as:  WELLBUTRIN XL TAKE 1 TABLET BY MOUTH EVERY MORNING   escitalopram 10 MG tablet Commonly known as:  LEXAPRO Take 10 mg by mouth daily. Patient takes 1/2 tablet.   ferrous sulfate 325 (65 FE) MG tablet Take 325 mg by mouth daily with breakfast.   Icosapent Ethyl 1 g Caps Commonly known as:  VASCEPA Take 2 capsules (2 g total) by mouth 2 (two) times daily.   lisinopril-hydrochlorothiazide 20-25 MG tablet Commonly known as:  PRINZIDE,ZESTORETIC lisinopril 20 mg-hydrochlorothiazide 25 mg  tablet   metoprolol succinate 50 MG 24 hr tablet Commonly known as:  TOPROL-XL   multivitamin-iron-minerals-folic acid chewable tablet Chew 1 tablet by mouth daily.   niacin 500 MG CR tablet Commonly known as:  NIASPAN TAKE 1 TABLET BY MOUTH EVERY DAY   vitamin C 100 MG tablet Take by mouth.          Objective:    BP (!) 145/80   Pulse (!) 57   Temp (!) 97.5 F (36.4 C) (Oral)   Ht 5\' 3"  (1.6 m)   Wt 198 lb 9.6 oz (90.1 kg)   BMI 35.18 kg/m   Allergies  Allergen Reactions  . Iodine     REACTION: rash    Wt Readings from Last 3 Encounters:  05/01/18 198 lb 9.6 oz (90.1 kg)  04/10/13 152 lb 3.2 oz (69 kg)  02/23/12 162 lb (73.5 kg)    Physical Exam  Results for orders placed or performed during the hospital encounter of 03/14/11  Hemoglobin and hematocrit, blood  Result Value Ref Range   Hemoglobin 13.7 12.0 - 15.0  g/dL   HCT 39.6 36.0 - 46.0 %  Differential  Result Value Ref Range   Neutrophils Relative % 89 (H) 43 - 77 %   Neutro Abs 10.4 (H) 1.7 - 7.7 K/uL   Lymphocytes Relative 6 (L) 12 - 46 %   Lymphs Abs 0.7 0.7 - 4.0 K/uL   Monocytes Relative 5 3 - 12 %   Monocytes Absolute 0.6 0.1 - 1.0 K/uL   Eosinophils Relative 0 0 - 5 %   Eosinophils Absolute 0.0 0.0 - 0.7 K/uL   Basophils Relative 0 0 - 1 %   Basophils Absolute 0.0 0.0 - 0.1 K/uL  CBC  Result Value Ref Range   WBC 11.7 (H) 4.0 - 10.5 K/uL   RBC 4.31 3.87 - 5.11 MIL/uL   Hemoglobin 13.3 12.0 - 15.0 g/dL   HCT 38.5 36.0 - 46.0 %   MCV 89.3 78.0 - 100.0 fL   MCH 30.9 26.0 - 34.0 pg   MCHC 34.5 30.0 - 36.0 g/dL   RDW 13.9 11.5 - 15.5 %   Platelets 243 150 - 400 K/uL  Hemoglobin and hematocrit, blood  Result Value Ref Range   Hemoglobin 12.2 12.0 - 15.0 g/dL   HCT 36.4 36.0 - 46.0 %  Differential  Result Value Ref Range   Neutrophils Relative % 69 43 - 77 %   Neutro Abs 6.1 1.7 - 7.7 K/uL   Lymphocytes Relative 23 12 - 46 %   Lymphs Abs 2.0 0.7 - 4.0 K/uL   Monocytes Relative 8 3 - 12 %   Monocytes Absolute 0.7 0.1 - 1.0 K/uL   Eosinophils Relative 1 0 - 5 %   Eosinophils Absolute 0.1 0.0 - 0.7 K/uL   Basophils Relative 0 0 - 1 %   Basophils Absolute 0.0 0.0 - 0.1 K/uL  CBC  Result Value Ref Range   WBC 8.8 4.0 - 10.5 K/uL   RBC 3.66 (L) 3.87 - 5.11 MIL/uL   Hemoglobin 11.1 (L) 12.0 - 15.0 g/dL   HCT 33.5 (L) 36.0 - 46.0 %   MCV 91.5 78.0 - 100.0 fL   MCH 30.3 26.0 - 34.0 pg   MCHC 33.1 30.0 - 36.0 g/dL   RDW 14.4 11.5 - 15.5 %   Platelets 211 150 - 400 K/uL      Assessment & Plan:   1. Depression, recurrent ([place]) Continue  to new Lexapro and Wellbutrin  2. Attention deficit Try Adderall XR 20 mg 1 daily  3. Gastroesophageal reflux disease without esophagitis Continue pantoprazole  4. Essential hypertension Continue diet and weight loss to control, no medications are needed at this time  5. Lap Roux Y  Gastric Bypass May 2012 Follow-up as needed   Continue all other maintenance medications as listed above.  Follow up plan: Return in about 1 month (around 05/29/2018) for recheck.  Educational handout given for [place] [facility] [address]  [place], [place] [address] [phone]   05/02/2018, 4:41 PM

## 2018-06-04 ENCOUNTER — Encounter: Payer: Self-pay | Admitting: Physician Assistant

## 2018-06-04 ENCOUNTER — Ambulatory Visit (INDEPENDENT_AMBULATORY_CARE_PROVIDER_SITE_OTHER): Payer: Managed Care, Other (non HMO) | Admitting: Physician Assistant

## 2018-06-04 VITALS — BP 147/74 | HR 58 | Temp 97.9°F | Ht 63.0 in | Wt 196.6 lb

## 2018-06-04 DIAGNOSIS — R41840 Attention and concentration deficit: Secondary | ICD-10-CM | POA: Diagnosis not present

## 2018-06-04 MED ORDER — AMPHETAMINE-DEXTROAMPHET ER 30 MG PO CP24: 30.0000 mg | ORAL_CAPSULE | ORAL | 0 refills | Status: DC

## 2018-06-04 NOTE — Progress Notes (Signed)
BP (!) 147/74   Pulse (!) 58   Temp 97.9 F (36.6 C) (Oral)   Ht 5\' 3"  (1.6 m)   Wt 196 lb 9.6 oz (89.2 kg)   BMI 34.83 kg/m    Subjective:    Patient ID: Patricia Carr, female    DOB: 07-16-1956, 62 y.o.   MRN: [id number]  HPI: Patricia Carr is a 62 y.o. female presenting on 06/04/2018 for Depression (1 month follow up ) and ADD (1 month follow up)  This patient comes in for periodic recheck on medications and conditions including depression and ADD.   The patient reports that she has been tolerating her medication very well.  She has not had any difficulty with taking medication.  She has not noticed a huge difference in her attention but does think that it is helping some.  We will progress the medicine.  She is doing well with her depression. Depression screen [facility] 2/9 06/04/2018 05/01/2018  Decreased Interest 1 1  Down, Depressed, Hopeless 1 1  PHQ - 2 Score 2 2  Altered sleeping 1 1  Tired, decreased energy 2 2  Change in appetite 0 0  Feeling bad or failure about yourself  1 1  Trouble concentrating 2 2  Moving slowly or fidgety/restless 1 1  Suicidal thoughts 0 0  PHQ-9 Score 9 9     All medications are reviewed today. There are no reports of any problems with the medications. All of the medical conditions are reviewed and updated.  Lab work is reviewed and will be ordered as medically necessary. There are no new problems reported with today's visit.   Past Medical History:  Diagnosis Date  . Anemia   . Anxiety   . Bronchitis   . Depression   . GERD (gastroesophageal reflux disease)   . Hypertension   . Joint pain   . Sinus complaint   . Sleep apnea   . Wears glasses    Relevant past medical, surgical, family and social history reviewed and updated as indicated. Interim medical history since our last visit reviewed. Allergies and medications reviewed and updated. DATA REVIEWED: CHART IN EPIC  Family History reviewed for pertinent findings.  Review of  Systems  Constitutional: Negative.  Negative for activity change, fatigue and fever.  HENT: Negative.   Eyes: Negative.   Respiratory: Negative.  Negative for cough.   Cardiovascular: Negative.  Negative for chest pain.  Gastrointestinal: Negative.  Negative for abdominal pain.  Endocrine: Negative.   Genitourinary: Negative.  Negative for dysuria.  Musculoskeletal: Negative.   Skin: Negative.   Neurological: Negative.     Allergies as of 06/04/2018      Reactions   Iodine    REACTION: rash      Medication List        Accurate as of 06/04/18  8:37 AM. Always use your most recent med list.          amphetamine-dextroamphetamine 30 MG 24 hr capsule Commonly known as:  ADDERALL XR Take 1 capsule (30 mg total) by mouth every morning.   aspirin 81 MG tablet Take 81 mg by mouth daily.   Biotin 1000 MCG tablet Take 1,000 mcg by mouth daily.   buPROPion 300 MG 24 hr tablet Commonly known as:  WELLBUTRIN XL TAKE 1 TABLET BY MOUTH EVERY MORNING   escitalopram 10 MG tablet Commonly known as:  LEXAPRO Take 10 mg by mouth daily. Patient takes 1/2 tablet.   ferrous sulfate  325 (65 FE) MG tablet Take 325 mg by mouth daily with breakfast.   Icosapent Ethyl 1 g Caps Commonly known as:  VASCEPA Take 2 capsules (2 g total) by mouth 2 (two) times daily.   lisinopril-hydrochlorothiazide 20-25 MG tablet Commonly known as:  PRINZIDE,ZESTORETIC lisinopril 20 mg-hydrochlorothiazide 25 mg tablet   metoprolol succinate 50 MG 24 hr tablet Commonly known as:  TOPROL-XL   multivitamin-iron-minerals-folic acid chewable tablet Chew 1 tablet by mouth daily.   vitamin C 100 MG tablet Take by mouth.          Objective:    BP (!) 147/74   Pulse (!) 58   Temp 97.9 F (36.6 C) (Oral)   Ht 5\' 3"  (1.6 m)   Wt 196 lb 9.6 oz (89.2 kg)   BMI 34.83 kg/m   Allergies  Allergen Reactions  . Iodine     REACTION: rash    Wt Readings from Last 3 Encounters:  06/04/18 196 lb 9.6 oz  (89.2 kg)  05/01/18 198 lb 9.6 oz (90.1 kg)  04/10/13 152 lb 3.2 oz (69 kg)    Physical Exam  Constitutional: She is oriented to person, place, and time. She appears well-developed and well-nourished.  HENT:  Head: Normocephalic and atraumatic.  Eyes: Pupils are equal, round, and reactive to light. Conjunctivae and EOM are normal.  Cardiovascular: Normal rate, regular rhythm, normal heart sounds and intact distal pulses.  Pulmonary/Chest: Effort normal and breath sounds normal.  Abdominal: Soft. Bowel sounds are normal.  Neurological: She is alert and oriented to person, place, and time. She has normal reflexes.  Skin: Skin is warm and dry. No rash noted.  Psychiatric: She has a normal mood and affect. Her behavior is normal. Judgment and thought content normal.        Assessment & Plan:   1. Attention deficit - amphetamine-dextroamphetamine (ADDERALL XR) 30 MG 24 hr capsule; Take 1 capsule (30 mg total) by mouth every morning.  Dispense: 30 capsule; Refill: 0   Continue all other maintenance medications as listed above.  Follow up plan: Return in about 1 month (around 07/02/2018) for recheck.  Educational handout given for Maria Antonia PA-C [facility] [address]  [place], [place] [address] [phone]   06/04/2018, 8:37 AM

## 2018-06-08 ENCOUNTER — Encounter (HOSPITAL_COMMUNITY): Payer: Self-pay | Admitting: Emergency Medicine

## 2018-06-08 ENCOUNTER — Emergency Department (HOSPITAL_COMMUNITY)
Admission: EM | Admit: 2018-06-08 | Discharge: 2018-06-09 | Disposition: A | Discharge status: Home / Self Care | Payer: Managed Care, Other (non HMO) | Attending: Emergency Medicine | Admitting: Emergency Medicine

## 2018-06-08 ENCOUNTER — Emergency Department (HOSPITAL_COMMUNITY): Payer: Managed Care, Other (non HMO)

## 2018-06-08 ENCOUNTER — Other Ambulatory Visit: Payer: Self-pay

## 2018-06-08 DIAGNOSIS — S62662B Nondisplaced fracture of distal phalanx of right middle finger, initial encounter for open fracture: Secondary | ICD-10-CM | POA: Diagnosis not present

## 2018-06-08 DIAGNOSIS — X58XXXA Exposure to other specified factors, initial encounter: Secondary | ICD-10-CM | POA: Diagnosis not present

## 2018-06-08 DIAGNOSIS — S61212A Laceration without foreign body of right middle finger without damage to nail, initial encounter: Secondary | ICD-10-CM | POA: Diagnosis present

## 2018-06-08 DIAGNOSIS — I10 Essential (primary) hypertension: Secondary | ICD-10-CM | POA: Diagnosis not present

## 2018-06-08 DIAGNOSIS — Y998 Other external cause status: Secondary | ICD-10-CM | POA: Diagnosis not present

## 2018-06-08 DIAGNOSIS — Y929 Unspecified place or not applicable: Secondary | ICD-10-CM | POA: Diagnosis not present

## 2018-06-08 DIAGNOSIS — Z7982 Long term (current) use of aspirin: Secondary | ICD-10-CM | POA: Insufficient documentation

## 2018-06-08 DIAGNOSIS — Y9389 Activity, other specified: Secondary | ICD-10-CM | POA: Insufficient documentation

## 2018-06-08 DIAGNOSIS — Z79899 Other long term (current) drug therapy: Secondary | ICD-10-CM | POA: Diagnosis not present

## 2018-06-08 DIAGNOSIS — S62639B Displaced fracture of distal phalanx of unspecified finger, initial encounter for open fracture: Secondary | ICD-10-CM

## 2018-06-08 MED ORDER — LIDOCAINE HCL 2 % IJ SOLN
20.0000 mL | Freq: Once | INTRAMUSCULAR | Status: AC
  Administered 2018-06-08: 400 mg via INTRADERMAL
  Filled 2018-06-08: qty 20

## 2018-06-08 NOTE — ED Provider Notes (Signed)
[facility] EMERGENCY DEPARTMENT Provider Note   CSN: [id number] Arrival date & time: 06/08/18  2050     History   Chief Complaint Chief Complaint  Patient presents with  . Finger Injury    HPI Patricia Carr is a 62 y.o. RHD female who presents with a right middle finger laceration.  Patient states that she had her dog's leash wrapped around her right hand and the dog "took off" and then she noticed bleeding of her distal right middle finger.  This occurred approximately 6 PM tonight.  She states that she went to urgent care and they were concerned that she did not have any feeling of her finger and did not have capability to obtain x-ray therefore they advised her to come to the emergency department for a hand consultation.  A digital block was performed by urgent care and they irrigated the wound.  She states that the digital block has worn off at this point but she still cannot feel the distal finger.  She also has difficulty bending her finger.  She is up-to-date on tetanus.    HPI  Past Medical History:  Diagnosis Date  . Anemia   . Anxiety   . Bronchitis   . Depression   . GERD (gastroesophageal reflux disease)   . Hypertension   . Joint pain   . Sinus complaint   . Sleep apnea   . Wears glasses     Patient Active Problem List   Diagnosis Date Noted  . Depression, recurrent ([place]) 05/01/2018  . Attention deficit 05/01/2018  . HTN (hypertension) 05/10/2011  . 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 GERD 12/17/2009  . NAUSEA WITH VOMITING 12/17/2009  . DYSPHAGIA UNSPECIFIED 12/17/2009    Past Surgical History:  Procedure Laterality Date  . COLONOSCOPY    . REPLACEMENT TOTAL KNEE Right   . ROUX-EN-Y PROCEDURE  03/2011  . TUBAL LIGATION  1985     OB History   None      Home Medications    Prior to Admission medications   Medication Sig Start Date End Date Taking? Authorizing Provider  amphetamine-dextroamphetamine  (ADDERALL XR) 30 MG 24 hr capsule Take 1 capsule (30 mg total) by mouth every morning. 06/04/18   Terald Sleeper, PA-C  Ascorbic Acid (VITAMIN C) 100 MG tablet Take by mouth.    [provider]  aspirin 81 MG tablet Take 81 mg by mouth daily.      [provider]  Biotin 1000 MCG tablet Take 1,000 mcg by mouth daily.    [provider]  buPROPion (WELLBUTRIN XL) 300 MG 24 hr tablet TAKE 1 TABLET BY MOUTH EVERY MORNING 09/14/16   Terald Sleeper, PA-C  escitalopram (LEXAPRO) 10 MG tablet Take 10 mg by mouth daily. Patient takes 1/2 tablet.    [provider]  ferrous sulfate 325 (65 FE) MG tablet Take 325 mg by mouth daily with breakfast.    [provider]  Icosapent Ethyl (VASCEPA) 1 g CAPS Take 2 capsules (2 g total) by mouth 2 (two) times daily. 05/01/18   Terald Sleeper, PA-C  lisinopril-hydrochlorothiazide (PRINZIDE,ZESTORETIC) 20-25 MG tablet lisinopril 20 mg-hydrochlorothiazide 25 mg tablet 04/13/17   [provider]  metoprolol succinate (TOPROL-XL) 50 MG 24 hr tablet  02/05/18   [provider]  multivitamin-iron-minerals-folic acid (CENTRUM) chewable tablet Chew 1 tablet by mouth daily.    [provider]    Family History Family  History  Problem Relation Age of Onset  . Heart disease Father   . Hypertension Father   . Hyperlipidemia Mother   . Colon cancer Mother   . Hyperlipidemia Sister     Social History Social History   Tobacco Use  . Smoking status: Never Smoker  . Smokeless tobacco: Never Used  Substance Use Topics  . Alcohol use: No  . Drug use: Never     Allergies   Iodine   Review of Systems Review of Systems  Skin: Positive for wound.  Neurological: Positive for numbness.     Physical Exam Updated Vital Signs BP (!) 176/91 (BP Location: Left Arm)   Pulse 85   Temp 98.8 F (37.1 C) (Oral)   Resp 14   SpO2 98%   Physical Exam  Constitutional: She is oriented to person, place, and  time. She appears well-developed and well-nourished. No distress.  HENT:  Head: Normocephalic and atraumatic.  Eyes: Pupils are equal, round, and reactive to light. Conjunctivae are normal. Right eye exhibits no discharge. Left eye exhibits no discharge. No scleral icterus.  Neck: Normal range of motion.  Cardiovascular: Normal rate.  Pulmonary/Chest: Effort normal. No respiratory distress.  Abdominal: She exhibits no distension.  Musculoskeletal:  Right middle finger: Jagged 3.5cm laceration from the distal finger tip to the DIP joint. Superficial laceration over the distal dorsal finger tip. Nail is intact. No tenderness. Subjective numbness distal to the DIP joint. Decreased ROM of the DIP joint.   Neurological: She is alert and oriented to person, place, and time.  Skin: Skin is warm and dry.  Psychiatric: She has a normal mood and affect. Her behavior is normal.  Nursing note and vitals reviewed.        ED Treatments / Results  Labs (all labs ordered are listed, but only abnormal results are displayed) Labs Reviewed - No data to display  EKG None  Radiology Dg Finger Middle Right  Result Date: 06/08/2018 CLINICAL DATA:  62 y/o  F; middle finger laceration. EXAM: RIGHT MIDDLE FINGER 2+V COMPARISON:  None. FINDINGS: Acute obliquely oriented and comminuted fracture of the third distal phalanx tuft with minimal displacement of fracture components. Large adjacent soft tissue defect. No additional fracture or joint dislocation identified. IMPRESSION: Acute obliquely oriented and comminuted fracture of the third distal phalanx tuft with minimal displacement of fracture components. Large adjacent soft tissue defect. Electronically Signed   By: Kristine Garbe M.D.   On: 06/08/2018 21:45    Procedures .Marland KitchenLaceration Repair Date/Time: 06/09/2018 12:44 AM Performed by: Recardo Evangelist, PA-C Authorized by: Recardo Evangelist, PA-C   Consent:    Consent obtained:  Verbal    Consent given by:  Patient   Risks discussed:  Infection and pain   Alternatives discussed:  No treatment Anesthesia (see MAR for exact dosages):    Anesthesia method:  Local infiltration   Local anesthetic:  Lidocaine 2% w/o epi Laceration details:    Location:  Finger   Finger location:  R long finger   Length (cm):  3.5   Depth (mm):  7 Repair type:    Repair type:  Intermediate Pre-procedure details:    Preparation:  Patient was prepped and draped in usual sterile fashion Exploration:    Wound exploration: wound explored through full range of motion and entire depth of wound probed and visualized     Wound extent: nerve damage and underlying fracture     Wound extent: no tendon damage noted and no  vascular damage noted     Contaminated: no   Treatment:    Area cleansed with:  Shur-Clens   Amount of cleaning:  Extensive   Irrigation method:  Pressure wash   Visualized foreign bodies/material removed: no   Skin repair:    Repair method:  Sutures   Suture size:  5-0   Suture material:  Prolene   Suture technique:  Simple interrupted   Number of sutures:  5 Approximation:    Approximation:  Loose Post-procedure details:    Dressing:  Non-adherent dressing   Patient tolerance of procedure:  Tolerated well, no immediate complications   (including critical care time)    Medications Ordered in ED Medications  lidocaine (XYLOCAINE) 2 % (with pres) injection 400 mg (400 mg Intradermal Given 06/08/18 2330)     Initial Impression / Assessment and Plan / ED Course  I have reviewed the triage vital signs and the nursing notes.  Pertinent labs & imaging results that were available during my care of the patient were reviewed by me and considered in my medical decision making (see chart for details).  62 year old female presents with a right middle finger laceration.  She is mildly hypertensive but otherwise vital signs are normal.  She does report subjective numbness over the  lateral aspect of the distal middle finger.  She states this is improved from when she was at urgent care earlier today.  She has decreased range of motion of the DIP joint.  X-ray shows a distal comminuted tuft fracture.  Discussed with Dr. Caralyn Guile.  The wound was copiously irrigated and the wound was loosely sutured.  5 stitches were placed.  The wound was dressed and she was put in a finger splint.  She was given a prescription for Keflex and advised to call Dr. Angus Palms office on Monday for follow-up.  She verbalized understanding.  Final Clinical Impressions(s) / ED Diagnoses   Final diagnoses:  Open fracture of tuft of distal phalanx of finger  Laceration of right middle finger without foreign body without damage to nail, initial encounter    ED Discharge Orders    None       Recardo Evangelist, PA-C 06/09/18 0045    Merrily Pew, MD 06/09/18 [phone]

## 2018-06-08 NOTE — ED Provider Notes (Signed)
Patient placed in Quick Look pathway, seen and evaluated   Chief Complaint: finger laceration  HPI: 62 y.o. female who is right handed presents from urgent care today for laceration to the right third digit.  Patient reports around 630 today she was walking her dog when police got caught on her finger and caused a laceration to the third finger distal to the DIP.  She notes that her bleeding is currently controlled.  She went over to urgent care who said that they were concerned she may have nerve damage to the distal tip of her finger.  They then performed a digital block to numb the finger so she could have the area irrigated.  As she now has a digital block performed her finger is completely numb.  She denies any pain at this current time.  She is up-to-date on her tetanus shot.  She reports that the x-ray machine was done at the urgent care so she was not evaluated for fracture.   ROS:  Positive ROS: (+) Laceration, numbness Negative ROS: (-) Pain  Physical Exam:   Gen: No distress  Neuro: Awake and Alert. Complete numbness of the right 3rd digits 2/2 digital block.   Skin: there is a 2.5 cm laceration noted on the finger pad of the third digit, distal to the PIP.  Bleeding is controlled.  Area does have some macerated tissue.  There is also a 1 cm laceration on the dorsal aspect of the third finger, distal to the DIP that extends into the nail.  Patient does have fake nails on.  No obvious nailbed laceration.  Radial pulse 2+  BP (!) 176/91 (BP Location: Left Arm)   Pulse 85   Temp 98.8 F (37.1 C) (Oral)   Resp 14   SpO2 98%   Plan:  Based on initial evaluation, labs are not indicated and radiology studies are indicated.  Patient counseled on process, plan, and necessity for staying for completing the evaluation."  Initiation of care has begun. The patient has been counseled on the process, plan, and necessity for staying for the completion/evaluation, and the remainder of the medical  screening examination    Lorelle Gibbs 06/08/18 2104    Sherwood Gambler, MD 06/09/18 [phone]

## 2018-06-08 NOTE — ED Triage Notes (Signed)
Pt has a laceration and skin tear to the right middle finger. At urgent care they cleaned it out and did a digital block.  Sent here to check for nerve damage.

## 2018-06-09 MED ORDER — CEPHALEXIN 500 MG PO CAPS: 500.0000 mg | ORAL_CAPSULE | Freq: Four times a day (QID) | ORAL | 0 refills | Status: DC

## 2018-06-09 NOTE — ED Notes (Signed)
Provided pt with contact information for Dr. Caralyn Guile in [place]. Pt departed in NAD, refused use of wheelchair.

## 2018-06-09 NOTE — Discharge Instructions (Signed)
Please call Dr. Angus Palms office on Monday for follow up appointment Keep finger clean and dry Take Keflex as directed Watch for signs of infection (redness, drainage, worsening pain) Return if worsening

## 2018-07-10 ENCOUNTER — Ambulatory Visit (INDEPENDENT_AMBULATORY_CARE_PROVIDER_SITE_OTHER): Payer: Managed Care, Other (non HMO) | Admitting: Physician Assistant

## 2018-07-10 ENCOUNTER — Encounter: Payer: Self-pay | Admitting: Physician Assistant

## 2018-07-10 VITALS — BP 139/76 | HR 56 | Temp 99.4°F | Ht 63.0 in | Wt 193.6 lb

## 2018-07-10 DIAGNOSIS — R238 Other skin changes: Secondary | ICD-10-CM

## 2018-07-10 DIAGNOSIS — R41840 Attention and concentration deficit: Secondary | ICD-10-CM

## 2018-07-10 MED ORDER — AMPHETAMINE-DEXTROAMPHET ER 30 MG PO CP24: 30.0000 mg | ORAL_CAPSULE | Freq: Every day | ORAL | 0 refills | Status: DC

## 2018-07-10 MED ORDER — CEPHALEXIN 500 MG PO CAPS: 500.0000 mg | ORAL_CAPSULE | Freq: Four times a day (QID) | ORAL | 0 refills | Status: DC

## 2018-07-10 MED ORDER — AMPHETAMINE-DEXTROAMPHET ER 30 MG PO CP24: 30.0000 mg | ORAL_CAPSULE | ORAL | 0 refills | Status: DC

## 2018-07-10 NOTE — Progress Notes (Signed)
BP 139/76   Pulse (!) 56   Temp 99.4 F (37.4 C) (Oral)   Ht 5\' 3"  (1.6 m)   Wt 193 lb 9.6 oz (87.8 kg)   BMI 34.29 kg/m    Subjective:    Patient ID: Patricia Carr, female    DOB: Aug 14, 1956, 62 y.o.   MRN: [id number]  HPI: Patricia Carr is a 62 y.o. female presenting on 07/10/2018 for ADD (1 month follow up ) This patient comes in for periodic recheck on medications and conditions including ADD.  Reports overall that the medication seems to be doing very well.  She feels that we have gotten to a good dose for her symptoms.  She also has a pimple that has become quite infected on her nose.  She knows that she needs to stop touching it.  She tries to use soap and water to keep it clean.  She has never had a lesion like this before.  All medications are reviewed today. There are no reports of any problems with the medications. All of the medical conditions are reviewed and updated.  Lab work is reviewed and will be ordered as medically necessary. There are no new problems reported with today's visit.    Past Medical History:  Diagnosis Date  . Anemia   . Anxiety   . Bronchitis   . Depression   . GERD (gastroesophageal reflux disease)   . Hypertension   . Joint pain   . Sinus complaint   . Sleep apnea   . Wears glasses    Relevant past medical, surgical, family and social history reviewed and updated as indicated. Interim medical history since our last visit reviewed. Allergies and medications reviewed and updated. DATA REVIEWED: CHART IN EPIC  Family History reviewed for pertinent findings.  Review of Systems  Constitutional: Negative.   HENT: Negative.   Eyes: Negative.   Respiratory: Negative.   Gastrointestinal: Negative.   Genitourinary: Negative.   Skin: Positive for wound.    Allergies as of 07/10/2018      Reactions   Iodine    REACTION: rash      Medication List        Accurate as of 07/10/18 11:01 AM. Always use your most recent med list.          amphetamine-dextroamphetamine 30 MG 24 hr capsule Commonly known as:  ADDERALL XR Take 1 capsule (30 mg total) by mouth every morning.   amphetamine-dextroamphetamine 30 MG 24 hr capsule Commonly known as:  ADDERALL XR Take 1 capsule (30 mg total) by mouth daily.   amphetamine-dextroamphetamine 30 MG 24 hr capsule Commonly known as:  ADDERALL XR Take 1 capsule (30 mg total) by mouth daily.   aspirin 81 MG tablet Take 81 mg by mouth daily.   Biotin 1000 MCG tablet Take 1,000 mcg by mouth daily.   buPROPion 300 MG 24 hr tablet Commonly known as:  WELLBUTRIN XL TAKE 1 TABLET BY MOUTH EVERY MORNING   cephALEXin 500 MG capsule Commonly known as:  KEFLEX Take 1 capsule (500 mg total) by mouth 4 (four) times daily.   escitalopram 10 MG tablet Commonly known as:  LEXAPRO Take 10 mg by mouth daily. Patient takes 1/2 tablet.   ferrous sulfate 325 (65 FE) MG tablet Take 325 mg by mouth daily with breakfast.   Icosapent Ethyl 1 g Caps Take 2 capsules (2 g total) by mouth 2 (two) times daily.   lisinopril-hydrochlorothiazide 20-25 MG tablet Commonly known  as:  PRINZIDE,ZESTORETIC lisinopril 20 mg-hydrochlorothiazide 25 mg tablet   metoprolol succinate 50 MG 24 hr tablet Commonly known as:  TOPROL-XL   multivitamin-iron-minerals-folic acid chewable tablet Chew 1 tablet by mouth daily.   vitamin C 100 MG tablet Take by mouth.          Objective:    BP 139/76   Pulse (!) 56   Temp 99.4 F (37.4 C) (Oral)   Ht 5\' 3"  (1.6 m)   Wt 193 lb 9.6 oz (87.8 kg)   BMI 34.29 kg/m   Allergies  Allergen Reactions  . Iodine     REACTION: rash    Wt Readings from Last 3 Encounters:  07/10/18 193 lb 9.6 oz (87.8 kg)  06/04/18 196 lb 9.6 oz (89.2 kg)  05/01/18 198 lb 9.6 oz (90.1 kg)    Physical Exam  Constitutional: She is oriented to person, place, and time. She appears well-developed and well-nourished.  HENT:  Head: Normocephalic and atraumatic.  Eyes: Pupils  are equal, round, and reactive to light. Conjunctivae and EOM are normal.  Cardiovascular: Normal rate, regular rhythm, normal heart sounds and intact distal pulses.  Pulmonary/Chest: Effort normal and breath sounds normal.  Abdominal: Soft. Bowel sounds are normal.  Neurological: She is alert and oriented to person, place, and time. She has normal reflexes.  Skin: Skin is warm and dry. Lesion and rash noted. Rash is pustular. There is erythema.  Psychiatric: She has a normal mood and affect. Her behavior is normal. Judgment and thought content normal.        Assessment & Plan:   1. Attention deficit - amphetamine-dextroamphetamine (ADDERALL XR) 30 MG 24 hr capsule; Take 1 capsule (30 mg total) by mouth every morning.  Dispense: 30 capsule; Refill: 0 - amphetamine-dextroamphetamine (ADDERALL XR) 30 MG 24 hr capsule; Take 1 capsule (30 mg total) by mouth daily.  Dispense: 30 capsule; Refill: 0 - amphetamine-dextroamphetamine (ADDERALL XR) 30 MG 24 hr capsule; Take 1 capsule (30 mg total) by mouth daily.  Dispense: 30 capsule; Refill: 0  2. Pimples Clean, avoid touching - cephALEXin (KEFLEX) 500 MG capsule; Take 1 capsule (500 mg total) by mouth 4 (four) times daily.  Dispense: 40 capsule; Refill: 0   Continue all other maintenance medications as listed above.  Follow up plan: Return in about 3 months (around 10/09/2018) for recheck.  Educational handout given for [employer] PA-C [facility] [address]  [place], [place] [address] [phone]   07/10/2018, 11:01 AM

## 2018-10-09 ENCOUNTER — Encounter: Payer: Self-pay | Admitting: Physician Assistant

## 2018-10-09 ENCOUNTER — Ambulatory Visit (INDEPENDENT_AMBULATORY_CARE_PROVIDER_SITE_OTHER): Payer: Managed Care, Other (non HMO) | Admitting: Physician Assistant

## 2018-10-09 VITALS — BP 147/72 | HR 57 | Temp 99.1°F | Ht 63.0 in | Wt 193.6 lb

## 2018-10-09 DIAGNOSIS — R41840 Attention and concentration deficit: Secondary | ICD-10-CM

## 2018-10-09 DIAGNOSIS — J340 Abscess, furuncle and carbuncle of nose: Secondary | ICD-10-CM

## 2018-10-09 MED ORDER — AMPHETAMINE-DEXTROAMPHET ER 30 MG PO CP24: 30.0000 mg | ORAL_CAPSULE | Freq: Every day | ORAL | 0 refills | Status: DC

## 2018-10-09 MED ORDER — DOXYCYCLINE HYCLATE 100 MG PO TABS
100.0000 mg | ORAL_TABLET | Freq: Two times a day (BID) | ORAL | 0 refills | Status: DC
Start: 2018-10-09 — End: 2018-10-17

## 2018-10-09 MED ORDER — AMPHETAMINE-DEXTROAMPHET ER 30 MG PO CP24
30.0000 mg | ORAL_CAPSULE | ORAL | 0 refills | Status: DC
Start: 2018-10-09 — End: 2019-02-12

## 2018-10-09 NOTE — Progress Notes (Signed)
BP (!) 160/89   Pulse (!) 57   Temp 99.1 F (37.3 C) (Oral)   Ht 5\' 3"  (1.6 m)   Wt 193 lb 9.6 oz (87.8 kg)   BMI 34.29 kg/m    Subjective:    Patient ID: Patricia Carr, female    DOB: [birth year], 62 y.o.   MRN: [id number]  HPI: Patricia Carr is a 61 y.o. female presenting on 10/09/2018 for ADD This patient comes in for periodic recheck on medications and conditions including ADD.   All medications are reviewed today. There are no reports of any problems with the medications. All of the medical conditions are reviewed and updated.  Lab work is reviewed and will be ordered as medically necessary. There are no new problems reported with today's visit.  The patient notes she has swelling of the left nare.  She states it started as a pimple and she did mess with it.  However now she has thickening of that entire area and some tenderness in the face along her nose.  She denies any fever or chills and she denies any drainage at this time.  Past Medical History:  Diagnosis Date  . Anemia   . Anxiety   . Bronchitis   . Depression   . GERD (gastroesophageal reflux disease)   . Hypertension   . Joint pain   . Sinus complaint   . Sleep apnea   . Wears glasses    Relevant past medical, surgical, family and social history reviewed and updated as indicated. Interim medical history since our last visit reviewed. Allergies and medications reviewed and updated. DATA REVIEWED: CHART IN EPIC  Family History reviewed for pertinent findings.  Review of Systems  Constitutional: Negative.  Negative for activity change, fatigue and fever.  HENT: Negative.   Eyes: Negative.   Respiratory: Negative.  Negative for cough.   Cardiovascular: Negative.  Negative for chest pain.  Gastrointestinal: Negative.  Negative for abdominal pain.  Endocrine: Negative.   Genitourinary: Negative.  Negative for dysuria.  Musculoskeletal: Negative.   Skin: Positive for color change and wound.    Neurological: Negative.     Allergies as of 10/09/2018      Reactions   Iodine    REACTION: rash      Medication List        Accurate as of 10/09/18  8:57 AM. Always use your most recent med list.          amphetamine-dextroamphetamine 30 MG 24 hr capsule Commonly known as:  ADDERALL XR Take 1 capsule (30 mg total) by mouth every morning.   amphetamine-dextroamphetamine 30 MG 24 hr capsule Commonly known as:  ADDERALL XR Take 1 capsule (30 mg total) by mouth daily.   amphetamine-dextroamphetamine 30 MG 24 hr capsule Commonly known as:  ADDERALL XR Take 1 capsule (30 mg total) by mouth daily.   aspirin 81 MG tablet Take 81 mg by mouth daily.   Biotin 1000 MCG tablet Take 1,000 mcg by mouth daily.   buPROPion 300 MG 24 hr tablet Commonly known as:  WELLBUTRIN XL TAKE 1 TABLET BY MOUTH EVERY MORNING   doxycycline 100 MG tablet Commonly known as:  VIBRA-TABS Take 1 tablet (100 mg total) by mouth 2 (two) times daily. 1 po bid   escitalopram 10 MG tablet Commonly known as:  LEXAPRO Take 10 mg by mouth daily. Patient takes 1/2 tablet.   ferrous sulfate 325 (65 FE) MG tablet Take 325 mg by mouth daily  with breakfast.   Icosapent Ethyl 1 g Caps Take 2 capsules (2 g total) by mouth 2 (two) times daily.   lisinopril-hydrochlorothiazide 20-25 MG tablet Commonly known as:  PRINZIDE,ZESTORETIC lisinopril 20 mg-hydrochlorothiazide 25 mg tablet   metoprolol succinate 50 MG 24 hr tablet Commonly known as:  TOPROL-XL   multivitamin-iron-minerals-folic acid chewable tablet Chew 1 tablet by mouth daily.   vitamin C 100 MG tablet Take by mouth.          Objective:    BP (!) 160/89   Pulse (!) 57   Temp 99.1 F (37.3 C) (Oral)   Ht 5\' 3"  (1.6 m)   Wt 193 lb 9.6 oz (87.8 kg)   BMI 34.29 kg/m   Allergies  Allergen Reactions  . Iodine     REACTION: rash    Wt Readings from Last 3 Encounters:  10/09/18 193 lb 9.6 oz (87.8 kg)  07/10/18 193 lb 9.6 oz  (87.8 kg)  06/04/18 196 lb 9.6 oz (89.2 kg)    Physical Exam  Constitutional: She is oriented to person, place, and time. She appears well-developed and well-nourished.  HENT:  Head: Normocephalic and atraumatic.  Nose: Sinus tenderness and nasal deformity present. Left sinus exhibits maxillary sinus tenderness.    Eyes: Pupils are equal, round, and reactive to light. Conjunctivae and EOM are normal.  Cardiovascular: Normal rate, regular rhythm, normal heart sounds and intact distal pulses.  Pulmonary/Chest: Effort normal and breath sounds normal.  Abdominal: Soft. Bowel sounds are normal.  Neurological: She is alert and oriented to person, place, and time. She has normal reflexes.  Skin: Skin is warm and dry. No rash noted.  Psychiatric: She has a normal mood and affect. Her behavior is normal. Judgment and thought content normal.        Assessment & Plan:   1. Cellulitis of external nose - doxycycline (VIBRA-TABS) 100 MG tablet; Take 1 tablet (100 mg total) by mouth 2 (two) times daily. 1 po bid  Dispense: 20 tablet; Refill: 0  2. Attention deficit - amphetamine-dextroamphetamine (ADDERALL XR) 30 MG 24 hr capsule; Take 1 capsule (30 mg total) by mouth every morning.  Dispense: 30 capsule; Refill: 0 - amphetamine-dextroamphetamine (ADDERALL XR) 30 MG 24 hr capsule; Take 1 capsule (30 mg total) by mouth daily.  Dispense: 30 capsule; Refill: 0 - amphetamine-dextroamphetamine (ADDERALL XR) 30 MG 24 hr capsule; Take 1 capsule (30 mg total) by mouth daily.  Dispense: 30 capsule; Refill: 0   Continue all other maintenance medications as listed above.  Follow up plan: Return in about 3 months (around 01/08/2019) for recheck.  Educational handout given for Lerna PA-C [place] [address]  [place], [place] [address] [phone]   10/09/2018, 8:57 AM

## 2018-10-17 ENCOUNTER — Other Ambulatory Visit: Payer: Self-pay | Admitting: Physician Assistant

## 2018-10-17 ENCOUNTER — Telehealth: Payer: Self-pay | Admitting: Physician Assistant

## 2018-10-17 DIAGNOSIS — J340 Abscess, furuncle and carbuncle of nose: Secondary | ICD-10-CM

## 2018-10-17 MED ORDER — DOXYCYCLINE HYCLATE 100 MG PO TABS: 100.0000 mg | ORAL_TABLET | Freq: Two times a day (BID) | ORAL | 0 refills | Status: DC

## 2018-10-17 NOTE — Telephone Encounter (Signed)
I sent a refill, but I sent to [facility], not [facility].

## 2018-10-17 NOTE — Telephone Encounter (Signed)
Pt aware refill sent in.

## 2018-10-18 ENCOUNTER — Encounter (HOSPITAL_COMMUNITY): Payer: Self-pay

## 2018-11-08 ENCOUNTER — Other Ambulatory Visit: Payer: Self-pay | Admitting: Physician Assistant

## 2018-11-08 MED ORDER — ESCITALOPRAM OXALATE 10 MG PO TABS
10.0000 mg | ORAL_TABLET | Freq: Every day | ORAL | 3 refills | Status: DC
Start: 2018-11-08 — End: 2019-07-09

## 2018-11-08 NOTE — Telephone Encounter (Signed)
What is the name of the medication? Send [facility]. Not using [facility] anymore  Have you contacted your pharmacy to request a refill? NO  Which pharmacy would you [facility]   Patient notified that their request is being sent to the clinical staff for review and that they should receive a call once it is complete. If they do not receive a call within 24 hours they can check with their pharmacy or our office.

## 2018-11-09 ENCOUNTER — Other Ambulatory Visit: Payer: Self-pay | Admitting: Physician Assistant

## 2018-11-09 MED ORDER — LISINOPRIL-HYDROCHLOROTHIAZIDE 20-25 MG PO TABS: ORAL_TABLET | ORAL | 1 refills | Status: DC

## 2018-11-09 MED ORDER — METOPROLOL SUCCINATE ER 50 MG PO TB24: 50.0000 mg | ORAL_TABLET | Freq: Every day | ORAL | 1 refills | Status: DC

## 2018-11-09 MED ORDER — BUPROPION HCL ER (XL) 300 MG PO TB24: 300.0000 mg | ORAL_TABLET | Freq: Every morning | ORAL | 2 refills | Status: DC

## 2018-11-09 NOTE — Telephone Encounter (Signed)
Medications  sent - patient aware 

## 2018-11-09 NOTE — Telephone Encounter (Signed)
Patient pharmacy got bought out so [facility] [place] needs new RX sent to them for Lisinopril and metoprolol 1 a day. And her Wellbutrin. Please advise.

## 2018-12-20 ENCOUNTER — Telehealth: Payer: Self-pay | Admitting: *Deleted

## 2018-12-20 ENCOUNTER — Other Ambulatory Visit: Payer: Self-pay | Admitting: Physician Assistant

## 2018-12-20 MED ORDER — METOPROLOL SUCCINATE ER 50 MG PO TB24: 100.0000 mg | ORAL_TABLET | Freq: Every day | ORAL | 1 refills | Status: DC

## 2018-12-20 NOTE — Telephone Encounter (Signed)
sent 

## 2018-12-20 NOTE — Telephone Encounter (Signed)
Fax from [facility] is for Metoprolol Succ ER 50 mg 1 QD Pt says she take 2 QHS Please advise

## 2019-01-11 ENCOUNTER — Ambulatory Visit: Payer: Managed Care, Other (non HMO) | Admitting: Physician Assistant

## 2019-02-12 ENCOUNTER — Encounter: Payer: Self-pay | Admitting: Physician Assistant

## 2019-02-12 ENCOUNTER — Other Ambulatory Visit: Payer: Self-pay

## 2019-02-12 ENCOUNTER — Ambulatory Visit (INDEPENDENT_AMBULATORY_CARE_PROVIDER_SITE_OTHER): Payer: Managed Care, Other (non HMO) | Admitting: Physician Assistant

## 2019-02-12 DIAGNOSIS — R41840 Attention and concentration deficit: Secondary | ICD-10-CM | POA: Diagnosis not present

## 2019-02-12 DIAGNOSIS — I10 Essential (primary) hypertension: Secondary | ICD-10-CM | POA: Diagnosis not present

## 2019-02-12 MED ORDER — LISINOPRIL-HYDROCHLOROTHIAZIDE 20-25 MG PO TABS
ORAL_TABLET | ORAL | 1 refills | Status: DC
Start: 2019-02-12 — End: 2019-09-02

## 2019-02-12 MED ORDER — AMPHETAMINE-DEXTROAMPHET ER 20 MG PO CP24: 20.0000 mg | ORAL_CAPSULE | Freq: Every day | ORAL | 0 refills | Status: DC

## 2019-02-12 NOTE — Progress Notes (Signed)
Telephone visit  Subjective: TW:[facility] PCP: Terald Sleeper, PA-C LEX:NTZGY MARIO VOONG is a 63 y.o. female calls for telephone consult today. Patient provides verbal consent for consult held via phone.  Patient is identified with 2 separate identifiers.  At this time the entire area is on COVID-19 social distancing and stay home orders are in place.  Patient is of higher risk and therefore we are performing this by a virtual method.  Location of patient: home Location of provider: [facility] Others present for call: no  This is a virtual call for the patient's recheck on her ADHD medications.  She states that she has been checking her blood pressure quite consistently and she has been having elevated readings.  Most recently she even had a 160/99.  She denied any chest pain or shortness of breath with the elevated blood pressure.  Working to discontinue her Adderall at this time and she will see if her blood pressure comes down.  If it comes down we will reinstate Adderall at a lower dose and see if she can handle this.  If not then we will not do this medication if it is going on increase her blood pressure too much.   ROS: Per HPI  Allergies  Allergen Reactions  . Iodine     REACTION: rash   Past Medical History:  Diagnosis Date  . Anemia   . Anxiety   . Bronchitis   . Depression   . GERD (gastroesophageal reflux disease)   . Hypertension   . Joint pain   . Sinus complaint   . Sleep apnea   . Wears glasses     Current Outpatient Medications:  .  amphetamine-dextroamphetamine (ADDERALL XR) 20 MG 24 hr capsule, Take 1 capsule (20 mg total) by mouth daily., Disp: 30 capsule, Rfl: 0 .  amphetamine-dextroamphetamine (ADDERALL XR) 30 MG 24 hr capsule, Take 1 capsule (30 mg total) by mouth every morning., Disp: 30 capsule, Rfl: 0 .  amphetamine-dextroamphetamine (ADDERALL XR) 30 MG 24 hr capsule, Take 1 capsule (30 mg total) by mouth daily., Disp: 30 capsule, Rfl: 0 .  Ascorbic Acid  (VITAMIN C) 100 MG tablet, Take by mouth., Disp: , Rfl:  .  aspirin 81 MG tablet, Take 81 mg by mouth daily.  , Disp: , Rfl:  .  Biotin 1000 MCG tablet, Take 1,000 mcg by mouth daily., Disp: , Rfl:  .  buPROPion (WELLBUTRIN XL) 300 MG 24 hr tablet, Take 1 tablet (300 mg total) by mouth every morning., Disp: 90 tablet, Rfl: 2 .  escitalopram (LEXAPRO) 10 MG tablet, Take 1 tablet (10 mg total) by mouth daily. Patient takes 1/2 tablet., Disp: 90 tablet, Rfl: 3 .  ferrous sulfate 325 (65 FE) MG tablet, Take 325 mg by mouth daily with breakfast., Disp: , Rfl:  .  Icosapent Ethyl (VASCEPA) 1 g CAPS, Take 2 capsules (2 g total) by mouth 2 (two) times daily., Disp: 360 capsule, Rfl: 3 .  lisinopril-hydrochlorothiazide (PRINZIDE,ZESTORETIC) 20-25 MG tablet, lisinopril 20 mg-hydrochlorothiazide 25 mg tablet, Disp: 90 tablet, Rfl: 1 .  metoprolol succinate (TOPROL-XL) 50 MG 24 hr tablet, Take 2 tablets (100 mg total) by mouth at bedtime., Disp: 180 tablet, Rfl: 1 .  multivitamin-iron-minerals-folic acid (CENTRUM) chewable tablet, Chew 1 tablet by mouth daily., Disp: , Rfl:   Assessment/ Plan: 63 y.o. female   1. Attention deficit Hold the medication for now and if blood pressure improves start the following medicine - amphetamine-dextroamphetamine (ADDERALL XR) 20 MG 24  hr capsule; Take 1 capsule (20 mg total) by mouth daily.  Dispense: 30 capsule; Refill: 0  2. Essential hypertension Continue metoprolol, lisinopril hydrochlorothiazide Monitor blood pressures and call back if they do not improve.  Plan for recheck in 4 weeks.   Start time: 2:05 PM End time: 2:16 PM  Meds ordered this encounter  Medications  . amphetamine-dextroamphetamine (ADDERALL XR) 20 MG 24 hr capsule    Sig: Take 1 capsule (20 mg total) by mouth daily.    Dispense:  30 capsule    Refill:  0    Order Specific Question:   Supervising Provider    Answer:   Janora Norlander [[id number]]    Particia Nearing PA-C [place] [phone]

## 2019-02-15 ENCOUNTER — Ambulatory Visit: Payer: Managed Care, Other (non HMO) | Admitting: Physician Assistant

## 2019-03-21 ENCOUNTER — Telehealth: Payer: Self-pay | Admitting: Physician Assistant

## 2019-05-27 ENCOUNTER — Other Ambulatory Visit: Payer: Self-pay | Admitting: Physician Assistant

## 2019-06-05 ENCOUNTER — Telehealth: Payer: Self-pay | Admitting: Physician Assistant

## 2019-06-05 NOTE — Telephone Encounter (Signed)
LMTCB

## 2019-06-06 ENCOUNTER — Telehealth: Payer: Self-pay | Admitting: Physician Assistant

## 2019-06-06 NOTE — Telephone Encounter (Signed)
appt made for 8/7 at 11am.  Patient aware

## 2019-06-07 ENCOUNTER — Other Ambulatory Visit: Payer: Self-pay | Admitting: Physician Assistant

## 2019-06-07 ENCOUNTER — Ambulatory Visit (INDEPENDENT_AMBULATORY_CARE_PROVIDER_SITE_OTHER): Payer: Managed Care, Other (non HMO) | Admitting: Physician Assistant

## 2019-06-07 ENCOUNTER — Encounter: Payer: Self-pay | Admitting: Physician Assistant

## 2019-06-07 ENCOUNTER — Other Ambulatory Visit: Payer: Self-pay

## 2019-06-07 VITALS — BP 176/94 | HR 67 | Temp 98.2°F | Ht 63.0 in | Wt 202.0 lb

## 2019-06-07 DIAGNOSIS — F339 Major depressive disorder, recurrent, unspecified: Secondary | ICD-10-CM | POA: Diagnosis not present

## 2019-06-07 DIAGNOSIS — F411 Generalized anxiety disorder: Secondary | ICD-10-CM | POA: Diagnosis not present

## 2019-06-07 DIAGNOSIS — I10 Essential (primary) hypertension: Secondary | ICD-10-CM

## 2019-06-07 MED ORDER — AMLODIPINE BESYLATE 5 MG PO TABS: 5.0000 mg | ORAL_TABLET | Freq: Every day | ORAL | 3 refills | Status: DC

## 2019-06-07 MED ORDER — CLONAZEPAM 0.5 MG PO TABS: 0.5000 mg | ORAL_TABLET | Freq: Two times a day (BID) | ORAL | 0 refills | Status: DC | PRN

## 2019-06-07 MED ORDER — BUSPIRONE HCL 10 MG PO TABS: 5.0000 mg | ORAL_TABLET | Freq: Three times a day (TID) | ORAL | 2 refills | Status: DC

## 2019-06-07 NOTE — Patient Instructions (Addendum)
Your provider wants you to schedule an appointment with a Psychologist/Psychiatrist. The following list of offices requires the patient to call and make their own appointment, as there is information they need that only you can provide. Please feel free to choose form the following providers:  [facility] in [place]  [facility]  [phone] [place], [place]  (Scheduled through [facility]) Must call and do an interview for appointment. Sees Children / Accepts Medicaid       [facility]  [phone] [place], [place] for Autism but does not treat it Sees Children / Accepts Medicaid  [facility]    [phone] [address], [address]   [place], [place] Medication management, substance abuse, bipolar, grief, family, marriage, OCD, anxiety, PTSD Sees children / Accepts Medicaid  [facility]    [phone] [address], [place], [place] children / Accepts [facility]  [facility]  [phone] [address] [place], [place]   Dr Lorenza [facility]     [phone] [address] [place], [place]  Sees ADD & ADHD for treatment Accepts Medicaid  [place]  [phone] [address] [place], [place] Evaluates for Autism Accepts [facility]  [facility]  [phone] [address] [place], [place]  Does Adult ADD evaluations Does not accept Medicaid  [facility]   [phone] [address], [place] therapy  Sees children as young as 81 years old Accepts Medicaid   [place]     Phone: [phone]

## 2019-06-09 NOTE — Progress Notes (Signed)
BP (!) 176/94   Pulse 67   Temp 98.2 F (36.8 C) (Temporal)   Ht 5\' 3"  (1.6 m)   Wt 202 lb (91.6 kg)   LMP 08/31/2010   BMI 35.78 kg/m    Subjective:    Patient ID: Patricia Carr, female    DOB: May 15, 1956, 63 y.o.   MRN: [id number]  HPI: Patricia Carr is a 63 y.o. female presenting on 06/07/2019 for Hypertension    Past Medical History:  Diagnosis Date  . Anemia   . Anxiety   . Bronchitis   . Depression   . GERD (gastroesophageal reflux disease)   . Hypertension   . Joint pain   . Sinus complaint   . Sleep apnea   . Wears glasses    Relevant past medical, surgical, family and social history reviewed and updated as indicated. Interim medical history since our last visit reviewed. Allergies and medications reviewed and updated. DATA REVIEWED: CHART IN EPIC  Family History reviewed for pertinent findings.  Review of Systems  Constitutional: Negative.   HENT: Negative.   Eyes: Negative.   Respiratory: Negative.   Gastrointestinal: Negative.   Genitourinary: Negative.   Psychiatric/Behavioral: Positive for decreased concentration, dysphoric mood and sleep disturbance. Negative for suicidal ideas. The patient is nervous/anxious.     Allergies as of 06/07/2019      Reactions   Iodine    REACTION: rash      Medication List       Accurate as of June 07, 2019 11:59 PM. If you have any questions, ask your nurse or doctor.        STOP taking these medications   amphetamine-dextroamphetamine 20 MG 24 hr capsule Commonly known as: Adderall XR Stopped by: Terald Sleeper, PA-C   aspirin 81 MG tablet Stopped by: Terald Sleeper, PA-C   ferrous sulfate 325 (65 FE) MG tablet Stopped by: Terald Sleeper, PA-C     TAKE these medications   amLODipine 5 MG tablet Commonly known as: NORVASC Take 1 tablet (5 mg total) by mouth daily. Started by: Terald Sleeper, PA-C   Biotin 1000 MCG tablet Take 1,000 mcg by mouth daily.   buPROPion 300 MG 24 hr tablet Commonly  known as: WELLBUTRIN XL TAKE 1 TABLET BY MOUTH EVERY DAY IN THE MORNING What changed: See the new instructions. Changed by: Terald Sleeper, PA-C   busPIRone 10 MG tablet Commonly known as: BUSPAR Take 0.5-1 tablets (5-10 mg total) by mouth 3 (three) times daily. Started by: Terald Sleeper, PA-C   clonazePAM 0.5 MG tablet Commonly known as: KLONOPIN Take 1 tablet (0.5 mg total) by mouth 2 (two) times daily as needed for anxiety. Started by: Terald Sleeper, PA-C   escitalopram 10 MG tablet Commonly known as: LEXAPRO Take 1 tablet (10 mg total) by mouth daily. Patient takes 1/2 tablet.   lisinopril-hydrochlorothiazide 20-25 MG tablet Commonly known as: ZESTORETIC lisinopril 20 mg-hydrochlorothiazide 25 mg tablet   metoprolol succinate 50 MG 24 hr tablet Commonly known as: TOPROL-XL TAKE 1 TABLET BY MOUTH EVERY DAY   multivitamin-iron-minerals-folic acid chewable tablet Chew 1 tablet by mouth daily.   vitamin C 100 MG tablet Take by mouth.          Objective:    BP (!) 176/94   Pulse 67   Temp 98.2 F (36.8 C) (Temporal)   Ht 5\' 3"  (1.6 m)   Wt 202 lb (91.6 kg)   LMP 08/31/2010  BMI 35.78 kg/m   Allergies  Allergen Reactions  . Iodine     REACTION: rash    Wt Readings from Last 3 Encounters:  06/07/19 202 lb (91.6 kg)  10/09/18 193 lb 9.6 oz (87.8 kg)  07/10/18 193 lb 9.6 oz (87.8 kg)    Physical Exam Constitutional:      Appearance: She is well-developed.  HENT:     Head: Normocephalic and atraumatic.  Eyes:     Conjunctiva/sclera: Conjunctivae normal.     Pupils: Pupils are equal, round, and reactive to light.  Cardiovascular:     Rate and Rhythm: Normal rate and regular rhythm.     Heart sounds: Normal heart sounds.  Pulmonary:     Effort: Pulmonary effort is normal.     Breath sounds: Normal breath sounds.  Abdominal:     General: Bowel sounds are normal.     Palpations: Abdomen is soft.  Skin:    General: Skin is warm and dry.     Findings:  No rash.  Neurological:     Mental Status: She is alert and oriented to person, place, and time.     Deep Tendon Reflexes: Reflexes are normal and symmetric.  Psychiatric:        Mood and Affect: Mood is anxious and depressed. Affect is tearful.        Behavior: Behavior normal.        Thought Content: Thought content normal.        Judgment: Judgment normal.         Assessment & Plan:   1. Depression, recurrent (HCC) Continue Lexapro and Wellbutrin.  2. GAD (generalized anxiety disorder) - busPIRone (BUSPAR) 10 MG tablet; Take 0.5-1 tablets (5-10 mg total) by mouth 3 (three) times daily.  Dispense: 90 tablet; Refill: 2 - clonazePAM (KLONOPIN) 0.5 MG tablet; Take 1 tablet (0.5 mg total) by mouth 2 (two) times daily as needed for anxiety.  Dispense: 20 tablet; Refill: 0  3. Essential hypertension - amLODipine (NORVASC) 5 MG tablet; Take 1 tablet (5 mg total) by mouth daily.  Dispense: 90 tablet; Refill: 3   Continue all other maintenance medications as listed above.  Follow up plan: Return in about 4 weeks (around 07/05/2019).  Educational handout given for [facility] PA-C [place] [address]  [place], [place] [address] [phone]   06/09/2019, 9:49 PM

## 2019-06-17 DIAGNOSIS — Z0289 Encounter for other administrative examinations: Secondary | ICD-10-CM

## 2019-06-29 ENCOUNTER — Other Ambulatory Visit: Payer: Self-pay | Admitting: Physician Assistant

## 2019-06-29 DIAGNOSIS — F411 Generalized anxiety disorder: Secondary | ICD-10-CM

## 2019-07-01 ENCOUNTER — Other Ambulatory Visit: Payer: Self-pay | Admitting: Physician Assistant

## 2019-07-01 DIAGNOSIS — F411 Generalized anxiety disorder: Secondary | ICD-10-CM

## 2019-07-09 ENCOUNTER — Encounter: Payer: Self-pay | Admitting: Physician Assistant

## 2019-07-09 ENCOUNTER — Other Ambulatory Visit: Payer: Self-pay

## 2019-07-09 ENCOUNTER — Ambulatory Visit (INDEPENDENT_AMBULATORY_CARE_PROVIDER_SITE_OTHER): Payer: Managed Care, Other (non HMO) | Admitting: Physician Assistant

## 2019-07-09 VITALS — BP 139/83 | HR 64 | Temp 98.7°F | Ht 63.0 in | Wt 204.0 lb

## 2019-07-09 DIAGNOSIS — F419 Anxiety disorder, unspecified: Secondary | ICD-10-CM | POA: Diagnosis not present

## 2019-07-09 DIAGNOSIS — F411 Generalized anxiety disorder: Secondary | ICD-10-CM | POA: Diagnosis not present

## 2019-07-09 DIAGNOSIS — I10 Essential (primary) hypertension: Secondary | ICD-10-CM | POA: Diagnosis not present

## 2019-07-09 DIAGNOSIS — F339 Major depressive disorder, recurrent, unspecified: Secondary | ICD-10-CM

## 2019-07-09 DIAGNOSIS — Z0000 Encounter for general adult medical examination without abnormal findings: Secondary | ICD-10-CM

## 2019-07-09 MED ORDER — BUPROPION HCL ER (XL) 150 MG PO TB24
150.0000 mg | ORAL_TABLET | Freq: Every day | ORAL | Status: DC
Start: 2019-07-09 — End: 2019-07-31

## 2019-07-09 MED ORDER — BUSPIRONE HCL 15 MG PO TABS: 15.0000 mg | ORAL_TABLET | Freq: Three times a day (TID) | ORAL | 1 refills | Status: DC

## 2019-07-09 MED ORDER — ESCITALOPRAM OXALATE 20 MG PO TABS: 20.0000 mg | ORAL_TABLET | Freq: Every day | ORAL | 0 refills | Status: DC

## 2019-07-09 MED ORDER — METOPROLOL SUCCINATE ER 50 MG PO TB24: 100.0000 mg | ORAL_TABLET | Freq: Every day | ORAL | 1 refills | Status: DC

## 2019-07-09 NOTE — Progress Notes (Signed)
BP 139/83    Pulse 64    Temp 98.7 F (37.1 C) (Temporal)    Ht '5\' 3"'$  (1.6 m)    Wt 204 lb (92.5 kg)    LMP 08/31/2010    SpO2 98%    BMI 36.14 kg/m    Subjective:    Patient ID: Patricia Carr, female    DOB: [birth year], 63 y.o.   MRN: [id number]  HPI: NYELLE Carr is a 63 y.o. female presenting on 07/09/2019 for Hypertension (4 week follo wup ) and Depression  This patient comes in for 4-week follow-up on her hypertension, anxiety and depression.  She states that she has tolerated adding of the Lexapro on.  She is not had any difficulty with taking the medication.  She states she can tell a little bit of difference.  She is still extremely anxious and unable to have good focus, concentration.  Her blood pressure has improved some of the changing of her medication.  We will plan to have her come back in a month for recheck on all of this.  At this time we will have her continue to be out of work from 69/10 through 08/31/2019.  The patient does have psychiatry, Dr. Toy Care 07/31/2019.    Guida Asman was seen in my clinic on 07/09/2019. She will be out 8/10-10/31/2020.  Past Medical History:  Diagnosis Date   Anemia    Anxiety    Bronchitis    Depression    GERD (gastroesophageal reflux disease)    Hypertension    Joint pain    Sinus complaint    Sleep apnea    Wears glasses    Relevant past medical, surgical, family and social history reviewed and updated as indicated. Interim medical history since our last visit reviewed. Allergies and medications reviewed and updated. DATA REVIEWED: CHART IN EPIC  Family History reviewed for pertinent findings.  Review of Systems  Constitutional: Negative.   HENT: Negative.   Eyes: Negative.   Respiratory: Negative.   Gastrointestinal: Negative.   Genitourinary: Negative.     Allergies as of 07/09/2019      Reactions   Iodine    REACTION: rash      Medication List       Accurate as of July 09, 2019  1:20 PM. If  you have any questions, ask your nurse or doctor.        amLODipine 5 MG tablet Commonly known as: NORVASC Take 1 tablet (5 mg total) by mouth daily.   Biotin 1000 MCG tablet Take 1,000 mcg by mouth daily.   buPROPion 150 MG 24 hr tablet Commonly known as: WELLBUTRIN XL Take 1 tablet (150 mg total) by mouth daily. What changed:   medication strength  See the new instructions. Changed by: Terald Sleeper, PA-C   busPIRone 15 MG tablet Commonly known as: BUSPAR Take 1 tablet (15 mg total) by mouth 3 (three) times daily. What changed:   medication strength  how much to take Changed by: Terald Sleeper, PA-C   clonazePAM 0.5 MG tablet Commonly known as: KLONOPIN TAKE 1 TABLET (0.5 MG TOTAL) BY MOUTH 2 (TWO) TIMES DAILY AS NEEDED FOR ANXIETY.   escitalopram 20 MG tablet Commonly known as: LEXAPRO Take 1 tablet (20 mg total) by mouth daily. Patient takes 1/2 tablet. What changed:   medication strength  how much to take Changed by: Terald Sleeper, PA-C   lisinopril-hydrochlorothiazide 20-25 MG tablet Commonly known as: ZESTORETIC  lisinopril 20 mg-hydrochlorothiazide 25 mg tablet   metoprolol succinate 50 MG 24 hr tablet Commonly known as: TOPROL-XL Take 2 tablets (100 mg total) by mouth at bedtime. Take with or immediately following a meal. What changed:   how much to take  when to take this  additional instructions Changed by: Terald Sleeper, PA-C   multivitamin-iron-minerals-folic acid chewable tablet Chew 1 tablet by mouth daily.   vitamin C 100 MG tablet Take by mouth.          Objective:    BP 139/83    Pulse 64    Temp 98.7 F (37.1 C) (Temporal)    Ht '5\' 3"'$  (1.6 m)    Wt 204 lb (92.5 kg)    LMP 08/31/2010    SpO2 98%    BMI 36.14 kg/m   Allergies  Allergen Reactions   Iodine     REACTION: rash    Wt Readings from Last 3 Encounters:  07/09/19 204 lb (92.5 kg)  06/07/19 202 lb (91.6 kg)  10/09/18 193 lb 9.6 oz (87.8 kg)    Physical  Exam Constitutional:      General: She is not in acute distress.    Appearance: Normal appearance. She is well-developed.  HENT:     Head: Normocephalic and atraumatic.  Cardiovascular:     Rate and Rhythm: Normal rate.  Pulmonary:     Effort: Pulmonary effort is normal.  Skin:    General: Skin is warm and dry.     Findings: No rash.  Neurological:     Mental Status: She is alert and oriented to person, place, and time.     Deep Tendon Reflexes: Reflexes are normal and symmetric.         Assessment & Plan:   1. Depression, recurrent ([place]) - escitalopram (LEXAPRO) 20 MG tablet; Take 1 tablet (20 mg total) by mouth daily. Patient takes 1/2 tablet.  Dispense: 90 tablet; Refill: 0 - buPROPion (WELLBUTRIN XL) 150 MG 24 hr tablet; Take 1 tablet (150 mg total) by mouth daily.  2. Anxiety - escitalopram (LEXAPRO) 20 MG tablet; Take 1 tablet (20 mg total) by mouth daily. Patient takes 1/2 tablet.  Dispense: 90 tablet; Refill: 0  3. GAD (generalized anxiety disorder) - escitalopram (LEXAPRO) 20 MG tablet; Take 1 tablet (20 mg total) by mouth daily. Patient takes 1/2 tablet.  Dispense: 90 tablet; Refill: 0 - busPIRone (BUSPAR) 15 MG tablet; Take 1 tablet (15 mg total) by mouth 3 (three) times daily.  Dispense: 180 tablet; Refill: 1  4. Essential hypertension - metoprolol succinate (TOPROL-XL) 50 MG 24 hr tablet; Take 2 tablets (100 mg total) by mouth at bedtime. Take with or immediately following a meal.  Dispense: 180 tablet; Refill: 1 - CBC with Differential/Platelet  5. Well adult exam - CMP14+EGFR - Lipid panel - TSH - HIV Antibody (routine testing w rflx) - Hepatitis C antibody   Continue all other maintenance medications as listed above.  Follow up plan: Return in about 4 weeks (around 08/06/2019).  Educational handout given for [place] PA-C [place] [address]  [place], [place] [address] [phone]   07/09/2019, 1:20  PM

## 2019-07-10 LAB — CBC WITH DIFFERENTIAL/PLATELET
Basophils Absolute: 0.1 10*3/uL (ref 0.0–0.2)
Basos: 1 %
EOS (ABSOLUTE): 0.1 10*3/uL (ref 0.0–0.4)
Eos: 2 %
Hematocrit: 41.8 % (ref 34.0–46.6)
Hemoglobin: 14.7 g/dL (ref 11.1–15.9)
Immature Grans (Abs): 0 10*3/uL (ref 0.0–0.1)
Immature Granulocytes: 0 %
Lymphocytes Absolute: 1 10*3/uL (ref 0.7–3.1)
Lymphs: 17 %
MCH: 34.6 pg — ABNORMAL HIGH (ref 26.6–33.0)
MCHC: 35.2 g/dL (ref 31.5–35.7)
MCV: 98 fL — ABNORMAL HIGH (ref 79–97)
Monocytes Absolute: 0.5 10*3/uL (ref 0.1–0.9)
Monocytes: 9 %
Neutrophils Absolute: 4.2 10*3/uL (ref 1.4–7.0)
Neutrophils: 71 %
Platelets: 221 10*3/uL (ref 150–450)
RBC: 4.25 x10E6/uL (ref 3.77–5.28)
RDW: 13.1 % (ref 11.7–15.4)
WBC: 5.9 10*3/uL (ref 3.4–10.8)

## 2019-07-10 LAB — CMP14+EGFR
ALT: 14 IU/L (ref 0–32)
AST: 24 IU/L (ref 0–40)
Albumin/Globulin Ratio: 2 (ref 1.2–2.2)
Albumin: 4.5 g/dL (ref 3.8–4.8)
Alkaline Phosphatase: 92 IU/L (ref 39–117)
BUN/Creatinine Ratio: 23 (ref 12–28)
BUN: 18 mg/dL (ref 8–27)
Bilirubin Total: 0.6 mg/dL (ref 0.0–1.2)
CO2: 27 mmol/L (ref 20–29)
Calcium: 9.6 mg/dL (ref 8.7–10.3)
Chloride: 99 mmol/L (ref 96–106)
Creatinine, Ser: 0.79 mg/dL (ref 0.57–1.00)
GFR calc Af Amer: 93 mL/min/{1.73_m2} (ref >59)
GFR calc non Af Amer: 81 mL/min/{1.73_m2} (ref >59)
Globulin, Total: 2.2 g/dL (ref 1.5–4.5)
Glucose: 111 mg/dL — ABNORMAL HIGH (ref 65–99)
Potassium: 4.4 mmol/L (ref 3.5–5.2)
Sodium: 140 mmol/L (ref 134–144)
Total Protein: 6.7 g/dL (ref 6.0–8.5)

## 2019-07-10 LAB — LIPID PANEL
Chol/HDL Ratio: 3.2 ratio (ref 0.0–4.4)
Cholesterol, Total: 230 mg/dL — ABNORMAL HIGH (ref 100–199)
HDL: 72 mg/dL (ref >39)
LDL Chol Calc (NIH): 118 mg/dL — ABNORMAL HIGH (ref 0–99)
Triglycerides: 235 mg/dL — ABNORMAL HIGH (ref 0–149)
VLDL Cholesterol Cal: 40 mg/dL (ref 5–40)

## 2019-07-10 LAB — HIV ANTIBODY (ROUTINE TESTING W REFLEX): HIV Screen 4th Generation wRfx: NONREACTIVE

## 2019-07-10 LAB — TSH: TSH: 3.67 u[IU]/mL (ref 0.450–4.500)

## 2019-07-10 LAB — HEPATITIS C ANTIBODY: Hep C Virus Ab: 0.1 s/co ratio (ref 0.0–0.9)

## 2019-07-17 ENCOUNTER — Telehealth: Payer: Self-pay | Admitting: Physician Assistant

## 2019-07-17 NOTE — Telephone Encounter (Signed)
Detailed message left that note has been faxed.

## 2019-07-22 ENCOUNTER — Other Ambulatory Visit: Payer: Self-pay | Admitting: Physician Assistant

## 2019-07-22 DIAGNOSIS — F411 Generalized anxiety disorder: Secondary | ICD-10-CM

## 2019-07-23 ENCOUNTER — Other Ambulatory Visit: Payer: Self-pay | Admitting: Physician Assistant

## 2019-07-23 ENCOUNTER — Telehealth: Payer: Self-pay | Admitting: Physician Assistant

## 2019-07-23 DIAGNOSIS — F411 Generalized anxiety disorder: Secondary | ICD-10-CM

## 2019-07-23 MED ORDER — CLONAZEPAM 0.5 MG PO TABS: 0.5000 mg | ORAL_TABLET | Freq: Two times a day (BID) | ORAL | 0 refills | Status: DC | PRN

## 2019-07-23 NOTE — Telephone Encounter (Signed)
Patient aware.

## 2019-07-23 NOTE — Telephone Encounter (Signed)
Patient states that she just received a bottle of Welbutrin 300mg  and is wondering if she can cut this medication in half to equal 150mg  or does she just need to pick up new Rx for 150 mg.  Patient also requesting a refill on klonopin until she sees her therapist.

## 2019-07-23 NOTE — Telephone Encounter (Signed)
On July 09, 2019 I sent in the 150 mg strength to the pharmacy so I am not really sure why they gave her that.  Maybe check with them first.  It is okay for you to cut it if you need to but the correct strength was sent in on that date.

## 2019-07-31 ENCOUNTER — Telehealth: Payer: Self-pay | Admitting: Physician Assistant

## 2019-07-31 ENCOUNTER — Ambulatory Visit (INDEPENDENT_AMBULATORY_CARE_PROVIDER_SITE_OTHER): Payer: Managed Care, Other (non HMO) | Admitting: Physician Assistant

## 2019-07-31 ENCOUNTER — Encounter: Payer: Self-pay | Admitting: Physician Assistant

## 2019-07-31 DIAGNOSIS — F419 Anxiety disorder, unspecified: Secondary | ICD-10-CM

## 2019-07-31 DIAGNOSIS — R41840 Attention and concentration deficit: Secondary | ICD-10-CM

## 2019-07-31 DIAGNOSIS — I10 Essential (primary) hypertension: Secondary | ICD-10-CM

## 2019-07-31 DIAGNOSIS — F339 Major depressive disorder, recurrent, unspecified: Secondary | ICD-10-CM | POA: Diagnosis not present

## 2019-07-31 NOTE — Progress Notes (Signed)
        Telephone visit  Subjective: [id number], anxiety, hypertension PCP: Terald Sleeper, PA-C [id number] C Rusinko is a 63 y.o. female calls for telephone consult today. Patient provides verbal consent for consult held via phone.  Patient is identified with 2 separate identifiers.  At this time the entire area is on COVID-19 social distancing and stay home orders are in place.  Patient is of higher risk and therefore we are performing this by a virtual method.  Location of patient: home Location of provider: [facility] Others present for call: no    She had a visit with Dr Toy Care, psychiatrist for evaluation for depression, anxiety and possible PTSD. 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 Pristiq 100 mg started and will recheck with Dr Toy Care in a month. Lexapro 10mg   21 days then off of that med.  Stopped buspar, wellbutrin Continue clonazepam only as needed  Her blood readings are improving but still having some days. Today 141/90 She will not stop worrying about work and the situations ahead of her related to work.   Next appointment with me: 10/6  10/26 follow up with Dr. Toy Care  Her leave is through 08/31/2019  ROS: Per HPI  Allergies  Allergen Reactions  . Iodine     REACTION: rash   Past Medical History:  Diagnosis Date  . Anemia   . Anxiety   . Bronchitis   . Depression   . GERD (gastroesophageal reflux disease)   . Hypertension   . Joint pain   . Sinus complaint   . Sleep apnea   . Wears glasses     Current Outpatient Medications:  .  amLODipine (NORVASC) 5 MG tablet, Take 1 tablet (5 mg total) by mouth daily., Disp: 90 tablet, Rfl: 3 .  Ascorbic Acid (VITAMIN C) 100 MG tablet, Take by mouth., Disp: , Rfl:  .  Biotin 1000 MCG tablet, Take 1,000 mcg by mouth daily., Disp: , Rfl:  .  clonazePAM (KLONOPIN) 0.5 MG tablet, Take 1 tablet (0.5 mg total) by mouth 2 (two) times daily as needed for anxiety., Disp: 20 tablet, Rfl: 0  .  escitalopram (LEXAPRO) 20 MG tablet, Take 1 tablet (20 mg total) by mouth daily. Patient takes 1/2 tablet., Disp: 90 tablet, Rfl: 0 .  lisinopril-hydrochlorothiazide (PRINZIDE,ZESTORETIC) 20-25 MG tablet, lisinopril 20 mg-hydrochlorothiazide 25 mg tablet, Disp: 90 tablet, Rfl: 1 .  metoprolol succinate (TOPROL-XL) 50 MG 24 hr tablet, Take 2 tablets (100 mg total) by mouth at bedtime. Take with or immediately following a meal., Disp: 180 tablet, Rfl: 1 .  multivitamin-iron-minerals-folic acid (CENTRUM) chewable tablet, Chew 1 tablet by mouth daily., Disp: , Rfl:   Assessment/ Plan: 63 y.o. female   1. Depression, recurrent ([place]) Starting Pristiq Stopped wellbutrin, stopping lexapro  2. Anxiety Clonazepam as needed Stopped buspar  3. Essential hypertension Norvasc 5 mg one daily Lisinopril-hctz 20/25 Metoprolol 50 mg 2 at bed  4. Attention deficit Held adderall at this time   No follow-ups on file.  Continue all other maintenance medications as listed above.  Start time: 3:42 PM End time: 4:02 PM  No orders of the defined types were placed in this encounter.   Particia Nearing PA-C [place] [phone]

## 2019-07-31 NOTE — Telephone Encounter (Signed)
Televisit appt made. Patient aware 

## 2019-08-02 NOTE — Progress Notes (Signed)
Can you send this to [facility] please

## 2019-08-02 NOTE — Progress Notes (Signed)
This has already been sent to [facility].

## 2019-08-06 ENCOUNTER — Ambulatory Visit: Payer: Managed Care, Other (non HMO) | Admitting: Physician Assistant

## 2019-08-06 ENCOUNTER — Encounter: Payer: Self-pay | Admitting: Physician Assistant

## 2019-08-06 ENCOUNTER — Telehealth (INDEPENDENT_AMBULATORY_CARE_PROVIDER_SITE_OTHER): Payer: Managed Care, Other (non HMO) | Admitting: Physician Assistant

## 2019-08-06 DIAGNOSIS — F419 Anxiety disorder, unspecified: Secondary | ICD-10-CM | POA: Diagnosis not present

## 2019-08-06 DIAGNOSIS — F339 Major depressive disorder, recurrent, unspecified: Secondary | ICD-10-CM

## 2019-08-06 DIAGNOSIS — D649 Anemia, unspecified: Secondary | ICD-10-CM

## 2019-08-06 DIAGNOSIS — I10 Essential (primary) hypertension: Secondary | ICD-10-CM | POA: Diagnosis not present

## 2019-08-06 DIAGNOSIS — E785 Hyperlipidemia, unspecified: Secondary | ICD-10-CM

## 2019-08-06 MED ORDER — OMEGA-3-ACID ETHYL ESTERS 1 G PO CAPS: 2.0000 g | ORAL_CAPSULE | Freq: Two times a day (BID) | ORAL | 5 refills | Status: DC

## 2019-08-06 NOTE — Progress Notes (Signed)
Lmtcb to schedule appointment. ?

## 2019-08-06 NOTE — Progress Notes (Signed)
Telephone visit  Subjective: [id number], anxiety, hypertension PCP: Terald Sleeper, PA-Patricia GP:[phone] Patricia Carr is a 63 y.o. female calls for VIDEO consult today. Patient provides verbal consent for consult held via [place].  Patient is identified with 2 separate identifiers.  At this time the entire area is on COVID-19 social distancing and stay home orders are in place.  Patient is of higher risk and therefore we are performing this by a virtual method.  Location of patient: home Location of provider: [facility] Others present for call: no   This is a telephone visit because the patient had had a possible exposure about 18 days ago to COVID-19.  She has not had any symptoms.  She is still on disability related to her hypertension and generalized anxiety and depression.  The patient's blood pressure is much improved.  It was 148/86 today.  We still have a little bit ago but it is doing a lot better.  She is still hearing from her work team and there is a lot of stress going on.  And she is worried about what will happen when she returns.  She is also worried because her disability claim has not been approved.  Therefore there are financial strains.  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  The patient has been on Pristiq 100 mg for about a week.  She thinks that she may be sleeping a little bit better.  She will be seeing Dr. Toy Care on October 26.  This is her psychiatrist.  The first counselor that she was given to make an appointment with is not certified in the specialist counseling recommended by psychiatry.  She has made a call to [facility] and Sue Lush is a psychologist that is certified.  She should be hearing back in the next 24 hours.  Also her psychiatrist recommended her getting a vitamin B12 check this to see if this could be aggravating her psychiatric diagnoses.  She will come into the office next week.  Her current work note is  to be out 8/10 through 10/31.  I will plan to talk with her again around the 27th before her final date.    ROS: Per HPI  Allergies  Allergen Reactions  . Iodine     REACTION: rash   Past Medical History:  Diagnosis Date  . Anemia   . Anxiety   . Bronchitis   . Depression   . GERD (gastroesophageal reflux disease)   . Hypertension   . Joint pain   . Sinus complaint   . Sleep apnea   . Wears glasses     Current Outpatient Medications:  .  amLODipine (NORVASC) 5 MG tablet, Take 1 tablet (5 mg total) by mouth daily., Disp: 90 tablet, Rfl: 3 .  Ascorbic Acid (VITAMIN Patricia) 100 MG tablet, Take by mouth., Disp: , Rfl:  .  Biotin 1000 MCG tablet, Take 1,000 mcg by mouth daily., Disp: , Rfl:  .  lisinopril-hydrochlorothiazide (PRINZIDE,ZESTORETIC) 20-25 MG tablet, lisinopril 20 mg-hydrochlorothiazide 25 mg tablet, Disp: 90 tablet, Rfl: 1 .  metoprolol succinate (TOPROL-XL) 50 MG 24 hr tablet, Take 2 tablets (100 mg total) by mouth at bedtime. Take with or immediately following a meal., Disp: 180 tablet, Rfl: 1 .  multivitamin-iron-minerals-folic acid (CENTRUM) chewable tablet, Chew 1 tablet by mouth daily., Disp: , Rfl:  .  omega-3 acid ethyl esters (LOVAZA) 1 g capsule, Take 2 capsules (2  g total) by mouth 2 (two) times daily., Disp: 120 capsule, Rfl: 5  Assessment/ Plan: 63 y.o. female   1. Essential hypertension Continue amlodipine Continue lisinopril hydrochlorothiazide Continue metoprolol  2. Depression, recurrent ([place]) Continue Pristiq and   3. Anxiety Continue clonazepam from Dr. Toy Care  4. Anemia, unspecified type - Vitamin B12; Future  5. Hyperlipidemia, unspecified hyperlipidemia type - omega-3 acid ethyl esters (LOVAZA) 1 g capsule; Take 2 capsules (2 g total) by mouth 2 (two) times daily.  Dispense: 120 capsule; Refill: 5   Return in about 3 weeks (around 08/27/2019).  Continue all other maintenance medications as listed above.  Start time: 10:05 AM End  time: 10:18 AM  Meds ordered this encounter  Medications  . omega-3 acid ethyl esters (LOVAZA) 1 g capsule    Sig: Take 2 capsules (2 g total) by mouth 2 (two) times daily.    Dispense:  120 capsule    Refill:  5    Order Specific Question:   Supervising Provider    Answer:   Janora Norlander [id number]    Particia Nearing PA-Patricia [place] [phone]

## 2019-08-13 ENCOUNTER — Other Ambulatory Visit (INDEPENDENT_AMBULATORY_CARE_PROVIDER_SITE_OTHER): Payer: Managed Care, Other (non HMO)

## 2019-08-13 ENCOUNTER — Other Ambulatory Visit: Payer: Self-pay

## 2019-08-13 DIAGNOSIS — D649 Anemia, unspecified: Secondary | ICD-10-CM

## 2019-08-14 LAB — VITAMIN B12: Vitamin B-12: 297 pg/mL (ref 232–1245)

## 2019-08-26 ENCOUNTER — Other Ambulatory Visit: Payer: Self-pay

## 2019-08-27 ENCOUNTER — Encounter: Payer: Self-pay | Admitting: Physician Assistant

## 2019-08-27 ENCOUNTER — Ambulatory Visit (INDEPENDENT_AMBULATORY_CARE_PROVIDER_SITE_OTHER): Payer: Managed Care, Other (non HMO) | Admitting: Physician Assistant

## 2019-08-27 VITALS — BP 143/87 | HR 63 | Temp 98.6°F | Ht 63.0 in | Wt 209.0 lb

## 2019-08-27 DIAGNOSIS — R41840 Attention and concentration deficit: Secondary | ICD-10-CM

## 2019-08-27 DIAGNOSIS — I10 Essential (primary) hypertension: Secondary | ICD-10-CM

## 2019-08-27 DIAGNOSIS — F339 Major depressive disorder, recurrent, unspecified: Secondary | ICD-10-CM | POA: Diagnosis not present

## 2019-08-27 DIAGNOSIS — Z1211 Encounter for screening for malignant neoplasm of colon: Secondary | ICD-10-CM

## 2019-08-27 DIAGNOSIS — F419 Anxiety disorder, unspecified: Secondary | ICD-10-CM | POA: Diagnosis not present

## 2019-08-27 DIAGNOSIS — E538 Deficiency of other specified B group vitamins: Secondary | ICD-10-CM

## 2019-08-27 DIAGNOSIS — Z9884 Bariatric surgery status: Secondary | ICD-10-CM

## 2019-08-27 MED ORDER — AMLODIPINE BESYLATE 10 MG PO TABS: 10.0000 mg | ORAL_TABLET | Freq: Every day | ORAL | 1 refills | Status: DC

## 2019-08-28 ENCOUNTER — Telehealth: Payer: Self-pay | Admitting: Physician Assistant

## 2019-08-28 ENCOUNTER — Other Ambulatory Visit: Payer: Self-pay | Admitting: Physician Assistant

## 2019-08-28 MED ORDER — "SYRINGE 25G X 1"" 3 ML MISC": 1.0000 [IU] | 12 refills | Status: AC

## 2019-08-28 MED ORDER — CYANOCOBALAMIN 1000 MCG/ML IJ SOLN: 1000.0000 ug | Freq: Once | INTRAMUSCULAR | 3 refills | Status: AC

## 2019-08-28 NOTE — Telephone Encounter (Signed)
Sent to [facility] 

## 2019-08-28 NOTE — Telephone Encounter (Signed)
Patient aware med sent over

## 2019-09-01 ENCOUNTER — Other Ambulatory Visit: Payer: Self-pay | Admitting: Physician Assistant

## 2019-09-01 DIAGNOSIS — E538 Deficiency of other specified B group vitamins: Secondary | ICD-10-CM | POA: Insufficient documentation

## 2019-09-01 NOTE — Progress Notes (Signed)
BP (!) 143/87   Pulse 63   Temp 98.6 F (37 C) (Temporal)   Ht 5\' 3"  (1.6 m)   Wt 209 lb (94.8 kg)   LMP 08/31/2010   SpO2 100%   BMI 37.02 kg/m    Subjective:    Patient ID: Patricia Carr, female    DOB: 09-Jun-1956, 63 y.o.   MRN: [id number]  HPI: Patricia Carr is a 63 y.o. female presenting on 08/27/2019 for Depression, Hypertension, and Anxiety  This patient comes in for recheck on her chronic medical conditions which do include anxiety, depression, hypertension, B12 deficiency.  Patient has known bariatric surgery.  And therefore her B12 can be lower because she does not absorb it.  It is at the very low range of normal.  Organ to have her get injections.  Her daughter is a Software engineer and is able to give her a weekly injection and then monthly.  Her psychiatrist is still working with her on medications and she is working hard with a Social worker.  She is good to be out of work through 09/30/2019.  They are working on some medications for attention and patient does have known obstructive sleep apnea.  It was improved when she lost weight.  We are also going to start an order for Cologuard test.  Past Medical History:  Diagnosis Date  . Anemia   . Anxiety   . Bronchitis   . Depression   . GERD (gastroesophageal reflux disease)   . Hypertension   . Joint pain   . Sinus complaint   . Sleep apnea   . Wears glasses    Relevant past medical, surgical, family and social history reviewed and updated as indicated. Interim medical history since our last visit reviewed. Allergies and medications reviewed and updated. DATA REVIEWED: CHART IN EPIC  Family History reviewed for pertinent findings.  Review of Systems  Constitutional: Negative.   HENT: Negative.   Eyes: Negative.   Respiratory: Negative.   Gastrointestinal: Negative.   Genitourinary: Negative.   Psychiatric/Behavioral: Positive for decreased concentration and dysphoric mood. Negative for sleep disturbance  and suicidal ideas. The patient is nervous/anxious.     Allergies as of 08/27/2019      Reactions   Iodine    REACTION: rash      Medication List       Accurate as of August 27, 2019 11:59 PM. If you have any questions, ask your nurse or doctor.        amLODipine 10 MG tablet Commonly known as: NORVASC Take 1 tablet (10 mg total) by mouth daily. What changed:   medication strength  how much to take Changed by: Terald Sleeper, PA-C   Biotin 1000 MCG tablet Take 1,000 mcg by mouth daily.   clonazePAM 0.5 MG tablet Commonly known as: KLONOPIN Take 0.5 mg by mouth 3 (three) times daily as needed for anxiety.   desvenlafaxine 100 MG 24 hr tablet Commonly known as: PRISTIQ Take 100 mg by mouth daily.   lisinopril-hydrochlorothiazide 20-25 MG tablet Commonly known as: ZESTORETIC lisinopril 20 mg-hydrochlorothiazide 25 mg tablet   metoprolol succinate 50 MG 24 hr tablet Commonly known as: TOPROL-XL Take 2 tablets (100 mg total) by mouth at bedtime. Take with or immediately following a meal.   multivitamin-iron-minerals-folic acid chewable tablet Chew 1 tablet by mouth daily.   omega-3 acid ethyl esters 1 g capsule Commonly known as: LOVAZA Take 2 capsules (2 g total) by mouth 2 (two)  times daily.   vitamin C 100 MG tablet Take by mouth.          Objective:    BP (!) 143/87   Pulse 63   Temp 98.6 F (37 C) (Temporal)   Ht 5\' 3"  (1.6 m)   Wt 209 lb (94.8 kg)   LMP 08/31/2010   SpO2 100%   BMI 37.02 kg/m   Allergies  Allergen Reactions  . Iodine     REACTION: rash    Wt Readings from Last 3 Encounters:  08/27/19 209 lb (94.8 kg)  07/09/19 204 lb (92.5 kg)  06/07/19 202 lb (91.6 kg)    Physical Exam Constitutional:      General: She is not in acute distress.    Appearance: Normal appearance. She is well-developed.  HENT:     Head: Normocephalic and atraumatic.  Cardiovascular:     Rate and Rhythm: Normal rate.  Pulmonary:     Effort:  Pulmonary effort is normal.  Skin:    General: Skin is warm and dry.     Findings: No rash.  Neurological:     Mental Status: She is alert and oriented to person, place, and time.     Deep Tendon Reflexes: Reflexes are normal and symmetric.     Results for orders placed or performed in visit on 08/13/19  Vitamin B12  Result Value Ref Range   Vitamin B-12 297 232 - 1,245 pg/mL      Assessment & Plan:   1. Essential hypertension - amLODipine (NORVASC) 10 MG tablet; Take 1 tablet (10 mg total) by mouth daily.  Dispense: 90 tablet; Refill: 1  2. Depression, recurrent ([place]) Continue  Psychiatry  3. Anxiety Continue  Psychiatry  4. Attention deficit Continue  Psychiatry  5. 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 Colon cancer screening - Cologuard  7. Vitamin B12 deficiency Start injections, B12 1000 mg weekly for 4 weeks and then 1 monthly  Continue all other maintenance medications as listed above.  Follow up plan: Return in about 4 weeks (around 09/24/2019).  Educational handout given for [place] PA-C [place] [address]  [place], [place] [address] [phone]   09/01/2019, 9:55 PM

## 2019-09-30 ENCOUNTER — Telehealth (INDEPENDENT_AMBULATORY_CARE_PROVIDER_SITE_OTHER): Payer: Managed Care, Other (non HMO) | Admitting: Physician Assistant

## 2019-09-30 ENCOUNTER — Encounter: Payer: Self-pay | Admitting: Physician Assistant

## 2019-09-30 VITALS — BP 133/79

## 2019-09-30 DIAGNOSIS — I10 Essential (primary) hypertension: Secondary | ICD-10-CM

## 2019-09-30 DIAGNOSIS — F339 Major depressive disorder, recurrent, unspecified: Secondary | ICD-10-CM | POA: Diagnosis not present

## 2019-09-30 DIAGNOSIS — R41840 Attention and concentration deficit: Secondary | ICD-10-CM

## 2019-09-30 DIAGNOSIS — E538 Deficiency of other specified B group vitamins: Secondary | ICD-10-CM

## 2019-09-30 DIAGNOSIS — F419 Anxiety disorder, unspecified: Secondary | ICD-10-CM

## 2019-09-30 DIAGNOSIS — Z9884 Bariatric surgery status: Secondary | ICD-10-CM

## 2019-09-30 MED ORDER — METOPROLOL SUCCINATE ER 50 MG PO TB24: 100.0000 mg | ORAL_TABLET | Freq: Every day | ORAL | 3 refills | Status: DC

## 2019-09-30 NOTE — Progress Notes (Signed)
Telephone visit  Subjective: [id number] chronic conditions, depression and anxiety disability  PCP: Terald Sleeper, PA-C [id number] C Hamada is a 63 y.o. female calls for telephone consult today. Patient provides verbal consent for consult held via phone.  Patient is identified with 2 separate identifiers.  At this time the entire area is on COVID-19 social distancing and stay home orders are in place.  Patient is of higher risk and therefore we are performing this by a virtual method.  Location of patient: home Location of provider: [facility] Others present for call: no  VIDEO could not connect on her side  The patient has been having care through psychiatry, Dr. Toy Care and counseling with a psychologist.  Her depression seems to be getting some better but is not completely there.  Her concentration is still not as good as it needs to be to return to her job.  They did change her medication to Pristiq, clonazepam.  They also tried armodafinil in the mornings to see if this would help with her tension and sleep apnea.  She felt like she had a headache and her blood pressure went up the-but so she has stopped it at this point.  The psychiatrist is going to try to something else at this time.  The definitely have her out through 10/31/2019.  We will plan to talk to her again in 4 weeks.  Depression screen [facility] 2/9 09/30/2019 08/27/2019 07/09/2019 06/07/2019 07/10/2018  Decreased Interest 1 2 2 1  0  Down, Depressed, Hopeless 2 1 1 2 1   PHQ - 2 Score 3 3 3 3 1   Altered sleeping 1 3 3 3  -  Tired, decreased energy 1 3 3 3  -  Change in appetite 1 2 3 3  -  Feeling bad or failure about yourself  1 1 1 3  -  Trouble concentrating 2 2 0 3 -  Moving slowly or fidgety/restless 1 0 0 0 -  Suicidal thoughts 0 0 0 0 -  PHQ-9 Score 10 14 13 18  -  Difficult doing work/chores Somewhat difficult Somewhat difficult Somewhat difficult - -     ROS: Per HPI  Allergies  Allergen Reactions  . Iodine    REACTION: rash   Past Medical History:  Diagnosis Date  . Anemia   . Anxiety   . Bronchitis   . Depression   . GERD (gastroesophageal reflux disease)   . Hypertension   . Joint pain   . Sinus complaint   . Sleep apnea   . Wears glasses     Current Outpatient Medications:  .  amLODipine (NORVASC) 10 MG tablet, Take 1 tablet (10 mg total) by mouth daily., Disp: 90 tablet, Rfl: 1 .  Ascorbic Acid (VITAMIN C) 100 MG tablet, Take by mouth., Disp: , Rfl:  .  Biotin 1000 MCG tablet, Take 1,000 mcg by mouth daily., Disp: , Rfl:  .  clonazePAM (KLONOPIN) 0.5 MG tablet, Take 0.5 mg by mouth 3 (three) times daily as needed for anxiety., Disp: , Rfl:  .  desvenlafaxine (PRISTIQ) 100 MG 24 hr tablet, Take 100 mg by mouth daily., Disp: , Rfl:  .  lisinopril-hydrochlorothiazide (ZESTORETIC) 20-25 MG tablet, TAKE 1 TABLET BY MOUTH EVERY DAY, Disp: 90 tablet, Rfl: 0 .  metoprolol succinate (TOPROL-XL) 50 MG 24 hr tablet, Take 2 tablets (100 mg total) by mouth at bedtime. Take with or immediately following a meal., Disp: 180 tablet, Rfl: 1 .  multivitamin-iron-minerals-folic acid (CENTRUM) chewable tablet, Chew 1  tablet by mouth daily., Disp: , Rfl:  .  omega-3 acid ethyl esters (LOVAZA) 1 g capsule, Take 2 capsules (2 g total) by mouth 2 (two) times daily., Disp: 120 capsule, Rfl: 5 .  Syringe/Needle, Disp, (SYRINGE 3CC/25GX1") 25G X 1" 3 ML MISC, 1 Units by Does not apply route once a week., Disp: 4 each, Rfl: 12  Assessment/ Plan: 63 y.o. female   1. Depression, recurrent ([facility]) Pristiq 100 mg once daily  2. Anxiety Clonazepam 0.5 mg up to TID  3. Attention deficit Will wait  4. Essential hypertension norvasc 10 Lisinopril-hctz 20-25 Metoprolol 50 mg 2 QHS  5. 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 B12 deficiency Continue injection   No follow-ups on file.  Continue all other maintenance medications as listed above.  Start time: 10:05 AM End time:  10:17 AM  No orders of the defined types were placed in this encounter.   Particia Nearing PA-C [place] [phone]

## 2019-10-23 LAB — COLOGUARD: Cologuard: NEGATIVE

## 2019-11-01 DIAGNOSIS — C50212 Malignant neoplasm of upper-inner quadrant of left female breast: Secondary | ICD-10-CM

## 2019-11-01 DIAGNOSIS — C44321 Squamous cell carcinoma of skin of nose: Secondary | ICD-10-CM

## 2019-11-01 DIAGNOSIS — Z170 Estrogen receptor positive status [ER+]: Secondary | ICD-10-CM

## 2019-11-01 HISTORY — DX: Malignant neoplasm of upper-inner quadrant of left female breast: C50.212

## 2019-11-01 HISTORY — DX: Malignant neoplasm of upper-inner quadrant of left female breast: Z17.0

## 2019-11-01 HISTORY — DX: Squamous cell carcinoma of skin of nose: C44.321

## 2019-11-01 HISTORY — PX: BREAST LUMPECTOMY WITH AXILLARY LYMPH NODE BIOPSY: SHX5593

## 2019-11-20 ENCOUNTER — Ambulatory Visit: Payer: Managed Care, Other (non HMO) | Attending: Internal Medicine

## 2019-11-20 DIAGNOSIS — Z23 Encounter for immunization: Secondary | ICD-10-CM | POA: Insufficient documentation

## 2019-11-20 NOTE — Progress Notes (Signed)
   Covid-19 Vaccination Clinic  Name:  Patricia Carr    MRN: [id number] DOB: 09/14/1956  11/20/2019  [name] was observed post Covid-19 immunization for 15 minutes without incidence. She was provided with Vaccine Information Sheet and instruction to access the V-Safe system.   [name] was instructed to call 911 with any severe reactions post vaccine: Marland Kitchen Difficulty breathing  . Swelling of your face and throat  . A fast heartbeat  . A bad rash all over your body  . Dizziness and weakness    Immunizations Administered    Name Date Dose VIS Date Route   Pfizer COVID-19 Vaccine 11/20/2019  6:55 PM 0.3 mL 10/11/2019 Intramuscular   Manufacturer: [place]   Lot: [id number]   [place]: [id number]

## 2019-12-11 ENCOUNTER — Ambulatory Visit: Payer: Managed Care, Other (non HMO) | Attending: Internal Medicine

## 2019-12-11 DIAGNOSIS — Z23 Encounter for immunization: Secondary | ICD-10-CM | POA: Insufficient documentation

## 2019-12-11 NOTE — Progress Notes (Signed)
   Covid-19 Vaccination Clinic  Name:  Patricia Carr    MRN: [id number] DOB: 21-May-1956  12/11/2019  Patricia Carr was observed post Covid-19 immunization for 15 minutes without incidence. She was provided with Vaccine Information Sheet and instruction to access the V-Safe system.   Patricia Carr was instructed to call 911 with any severe reactions post vaccine: Marland Kitchen Difficulty breathing  . Swelling of your face and throat  . A fast heartbeat  . A bad rash all over your body  . Dizziness and weakness    Immunizations Administered    Name Date Dose VIS Date Route   Pfizer COVID-19 Vaccine 12/11/2019  5:51 PM 0.3 mL 10/11/2019 Intramuscular   Manufacturer: [place]   Lot: [id number]   [place]: [id number]

## 2019-12-15 ENCOUNTER — Other Ambulatory Visit: Payer: Self-pay | Admitting: Physician Assistant

## 2019-12-15 DIAGNOSIS — E785 Hyperlipidemia, unspecified: Secondary | ICD-10-CM

## 2020-02-11 ENCOUNTER — Other Ambulatory Visit: Payer: Self-pay | Admitting: *Deleted

## 2020-02-11 DIAGNOSIS — I10 Essential (primary) hypertension: Secondary | ICD-10-CM

## 2020-02-11 MED ORDER — AMLODIPINE BESYLATE 10 MG PO TABS: 10.0000 mg | ORAL_TABLET | Freq: Every day | ORAL | 0 refills | Status: DC

## 2020-02-19 ENCOUNTER — Other Ambulatory Visit: Payer: Self-pay | Admitting: *Deleted

## 2020-02-19 MED ORDER — LISINOPRIL-HYDROCHLOROTHIAZIDE 20-25 MG PO TABS: ORAL_TABLET | ORAL | 0 refills | Status: DC

## 2020-02-27 IMAGING — DX DG FINGER MIDDLE 2+V*R*
3 series · 3 of 3 positions shown · non-contrast
Comparison: None.

CLINICAL DATA: 62 y/o  F; middle finger laceration.

EXAM:
RIGHT MIDDLE FINGER 2+V

[finger ap]
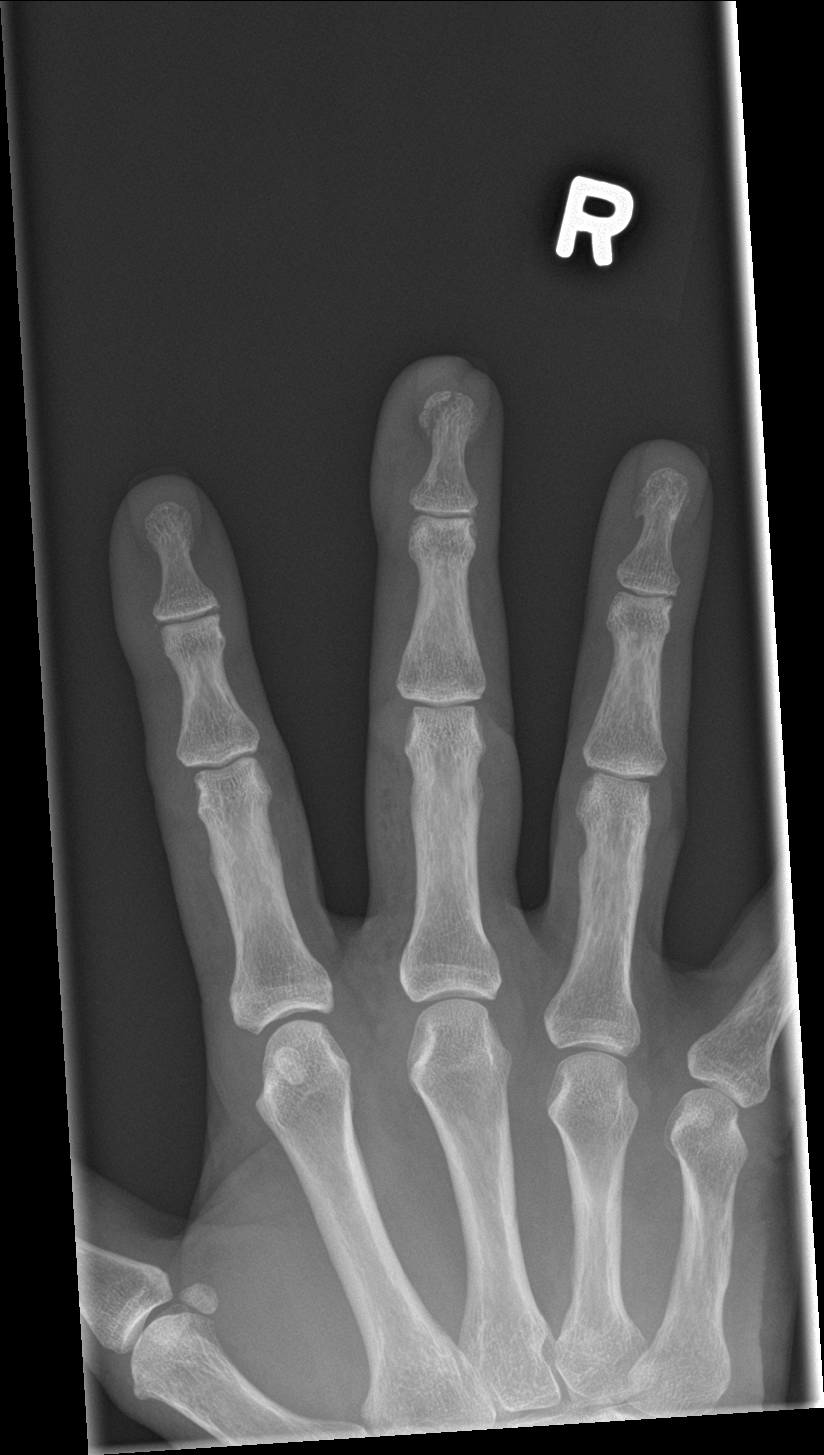

[finger obl]
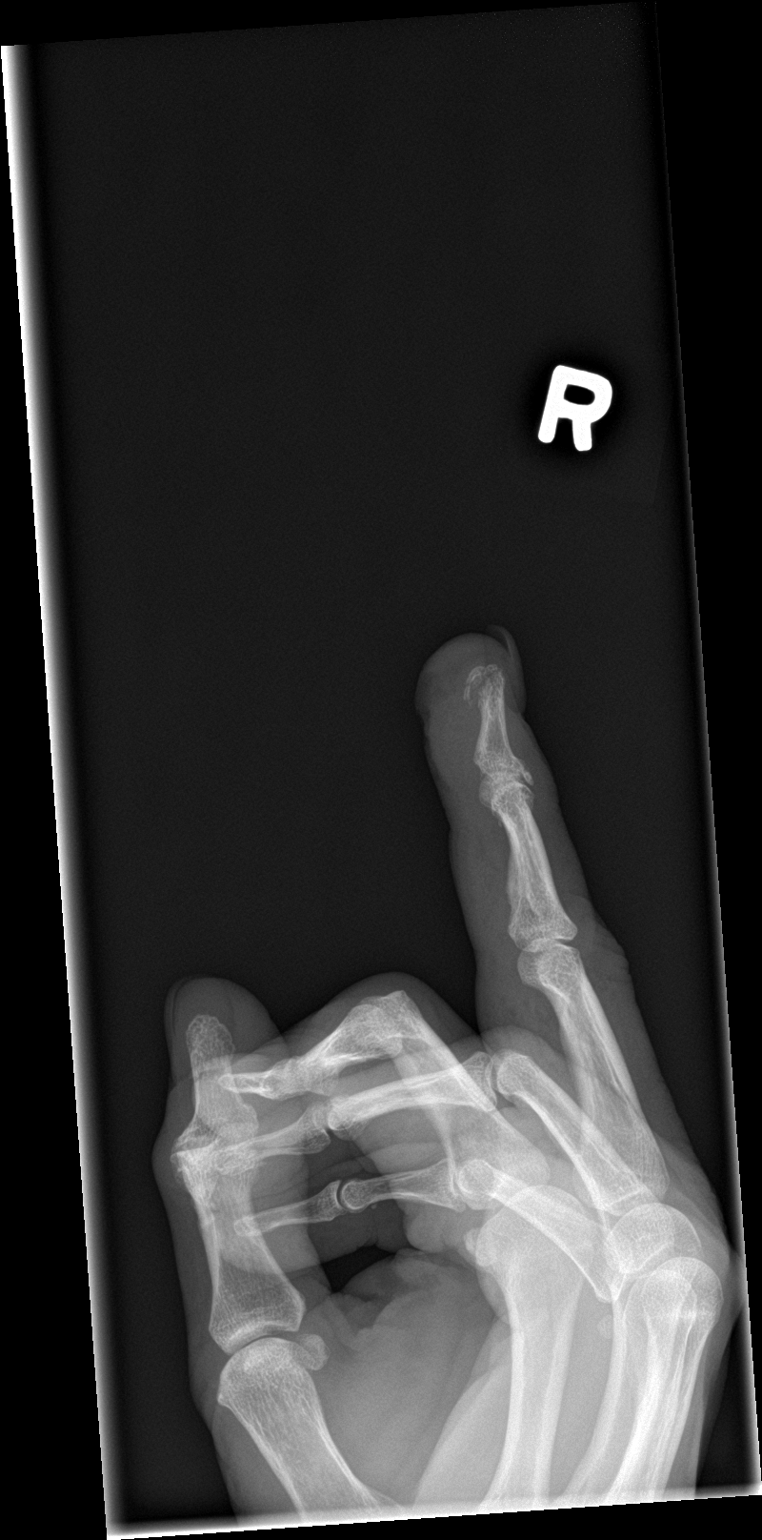

[finger lat]
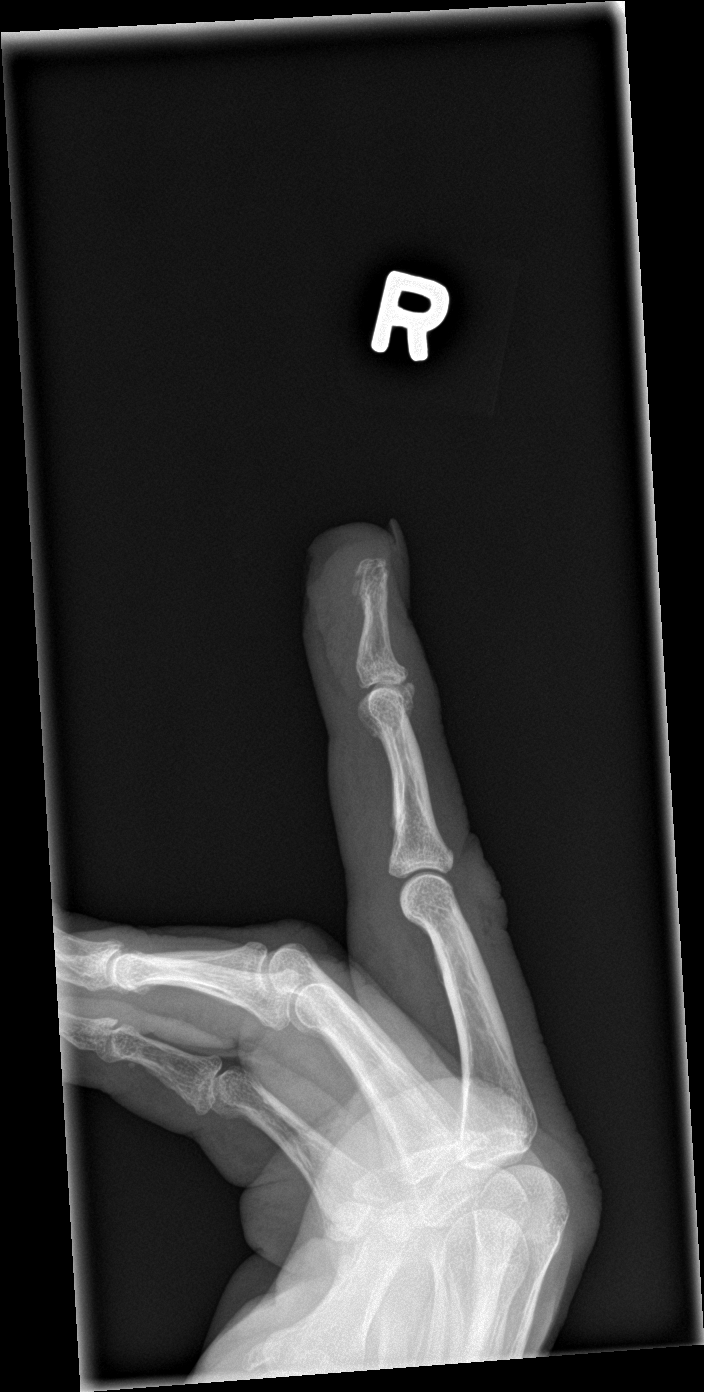

[3 of 3 positions shown; findings below may reference images not displayed]

FINDINGS: Acute obliquely oriented and comminuted fracture of the third distal
phalanx tuft with minimal displacement of fracture components. Large
adjacent soft tissue defect. No additional fracture or joint
dislocation identified.
IMPRESSION: Acute obliquely oriented and comminuted fracture of the third distal
phalanx tuft with minimal displacement of fracture components. Large
adjacent soft tissue defect.

By: Tomasa Arocho M.D.

## 2020-03-09 ENCOUNTER — Other Ambulatory Visit: Payer: Self-pay | Admitting: *Deleted

## 2020-03-09 DIAGNOSIS — E785 Hyperlipidemia, unspecified: Secondary | ICD-10-CM

## 2020-03-09 MED ORDER — OMEGA-3-ACID ETHYL ESTERS 1 G PO CAPS: 2.0000 g | ORAL_CAPSULE | Freq: Two times a day (BID) | ORAL | 0 refills | Status: DC

## 2020-03-14 ENCOUNTER — Other Ambulatory Visit: Payer: Self-pay | Admitting: Nurse Practitioner

## 2020-04-07 ENCOUNTER — Other Ambulatory Visit: Payer: Self-pay | Admitting: Nurse Practitioner

## 2020-04-07 ENCOUNTER — Other Ambulatory Visit: Payer: Self-pay | Admitting: Family

## 2020-04-07 DIAGNOSIS — E785 Hyperlipidemia, unspecified: Secondary | ICD-10-CM

## 2020-04-15 ENCOUNTER — Ambulatory Visit (INDEPENDENT_AMBULATORY_CARE_PROVIDER_SITE_OTHER): Payer: Managed Care, Other (non HMO) | Admitting: Family Medicine

## 2020-04-15 ENCOUNTER — Encounter: Payer: Self-pay | Admitting: Family Medicine

## 2020-04-15 ENCOUNTER — Other Ambulatory Visit: Payer: Self-pay

## 2020-04-15 VITALS — BP 121/72 | HR 92 | Temp 97.8°F | Ht 63.0 in | Wt 220.0 lb

## 2020-04-15 DIAGNOSIS — F339 Major depressive disorder, recurrent, unspecified: Secondary | ICD-10-CM

## 2020-04-15 DIAGNOSIS — Z7689 Persons encountering health services in other specified circumstances: Secondary | ICD-10-CM | POA: Diagnosis not present

## 2020-04-15 DIAGNOSIS — I10 Essential (primary) hypertension: Secondary | ICD-10-CM

## 2020-04-15 DIAGNOSIS — E782 Mixed hyperlipidemia: Secondary | ICD-10-CM | POA: Diagnosis not present

## 2020-04-15 DIAGNOSIS — F419 Anxiety disorder, unspecified: Secondary | ICD-10-CM

## 2020-04-15 DIAGNOSIS — Z9884 Bariatric surgery status: Secondary | ICD-10-CM

## 2020-04-15 DIAGNOSIS — R739 Hyperglycemia, unspecified: Secondary | ICD-10-CM

## 2020-04-15 MED ORDER — METOPROLOL SUCCINATE ER 50 MG PO TB24: 100.0000 mg | ORAL_TABLET | Freq: Every day | ORAL | 3 refills | Status: DC

## 2020-04-15 MED ORDER — LISINOPRIL-HYDROCHLOROTHIAZIDE 20-25 MG PO TABS: 1.0000 | ORAL_TABLET | Freq: Every day | ORAL | 3 refills | Status: DC

## 2020-04-15 NOTE — Progress Notes (Signed)
Subjective: CC:HTN, HLD, est care PCP: Janora Norlander, DO SAY:Patricia Carr is a 64 y.o. female presenting to clinic today for:  1. HTN, HLD, gastric bypass, depression/anxiety Patient reports compliance with hydrochlorothiazide/lisinopril, metoprolol and Lovaza.  She was actually previously on niacin but switched over to Vascepa which she tolerated well but unfortunately her insurance would not cover.  She was ultimately switched over to low vase and has been taking this without any difficulty but unsure if this is helping her cholesterol levels.  Of note there is a family history of early cardiovascular disease, where her father sustained an MI in his 47s.  No chest pain, shortness of breath.  She has had some weight gain with the Abilify and this concerns her.  She has had a history of Roux-en-Y gastric bypass.  She is on monthly B12 injections, which her daughter who is a Software engineer, administers intramuscularly.  She is cared for by Dr. Chucky May and notes that her mental health is under much better control with the Abilify, Nuvigil and Klonopin.   ROS: Per HPI  Allergies  Allergen Reactions  . Iodine     REACTION: rash   Past Medical History:  Diagnosis Date  . Anemia   . Anxiety   . Bronchitis   . Depression   . GERD (gastroesophageal reflux disease)   . Hypertension   . Joint pain   . Sinus complaint   . Sleep apnea   . Wears glasses     Current Outpatient Medications:  .  amLODipine (NORVASC) 10 MG tablet, Take 1 tablet (10 mg total) by mouth daily. (Needs to be seen before next refill), Disp: 30 tablet, Rfl: 0 .  Ascorbic Acid (VITAMIN C) 100 MG tablet, Take by mouth., Disp: , Rfl:  .  Biotin 1000 MCG tablet, Take 1,000 mcg by mouth daily., Disp: , Rfl:  .  clonazePAM (KLONOPIN) 0.5 MG tablet, Take 0.5 mg by mouth 3 (three) times daily as needed for anxiety., Disp: , Rfl:  .  desvenlafaxine (PRISTIQ) 100 MG 24 hr tablet, Take 100 mg by mouth daily., Disp: ,  Rfl:  .  lisinopril-hydrochlorothiazide (ZESTORETIC) 20-25 MG tablet, TAKE 1 TABLET BY MOUTH EVERY DAY, Disp: 30 tablet, Rfl: 0 .  metoprolol succinate (TOPROL-XL) 50 MG 24 hr tablet, Take 2 tablets (100 mg total) by mouth at bedtime., Disp: 180 tablet, Rfl: 3 .  multivitamin-iron-minerals-folic acid (CENTRUM) chewable tablet, Chew 1 tablet by mouth daily., Disp: , Rfl:  .  omega-3 acid ethyl esters (LOVAZA) 1 g capsule, Take 2 capsules (2 g total) by mouth 2 (two) times daily., Disp: 120 capsule, Rfl: 0 .  Syringe/Needle, Disp, (SYRINGE 3CC/25GX1") 25G X 1" 3 ML MISC, 1 Units by Does not apply route once a week., Disp: 4 each, Rfl: 12 Social History   Socioeconomic History  . Marital status: Married    Spouse name: Not on file  . Number of children: Not on file  . Years of education: Not on file  . Highest education level: Not on file  Occupational History  . Not on file  Tobacco Use  . Smoking status: Never Smoker  . Smokeless tobacco: Never Used  Vaping Use  . Vaping Use: Never used  Substance and Sexual Activity  . Alcohol use: No  . Drug use: Never  . Sexual activity: Yes  Other Topics Concern  . Not on file  Social History Narrative  . Not on file   Social Determinants of  Health   Financial Resource Strain:   . Difficulty of Paying Living Expenses:   Food Insecurity:   . Worried About Charity fundraiser in the Last Year:   . Arboriculturist in the Last Year:   Transportation Needs:   . Film/video editor (Medical):   Marland Kitchen Lack of Transportation (Non-Medical):   Physical Activity:   . Days of Exercise per Week:   . Minutes of Exercise per Session:   Stress:   . Feeling of Stress :   Social Connections:   . Frequency of Communication with Friends and Family:   . Frequency of Social Gatherings with Friends and Family:   . Attends Religious Services:   . Active Member of Clubs or Organizations:   . Attends Archivist Meetings:   Marland Kitchen Marital Status:     Intimate Partner Violence:   . Fear of Current or Ex-Partner:   . Emotionally Abused:   Marland Kitchen Physically Abused:   . Sexually Abused:    Family History  Problem Relation Age of Onset  . Heart disease Father   . Hypertension Father   . Hyperlipidemia Mother   . Colon cancer Mother   . Hyperlipidemia Sister     Objective: Office vital signs reviewed. BP 121/72   Pulse 92   Temp 97.8 F (36.6 C) (Temporal)   Ht '5\' 3"'$  (1.6 m)   Wt 220 lb (99.8 kg)   LMP 08/31/2010   SpO2 97%   BMI 38.97 kg/m   Physical Examination:  General: Awake, alert, well nourished, obese.  No acute distress HEENT: Normal; sclera white.  Moist mucous membranes Cardio: regular rate and rhythm, S1S2 heard, no murmurs appreciated Pulm: clear to auscultation bilaterally, no wheezes, rhonchi or rales; normal work of breathing on room air Extremities: warm, well perfused, No edema, cyanosis or clubbing; +2 pulses bilaterally Psych: Mood stable, speech normal, affect appropriate, pleasant and interactive  Depression screen [facility] 2/9 04/15/2020 09/30/2019 08/27/2019  Decreased Interest '1 1 2  '$ Down, Depressed, Hopeless '1 2 1  '$ PHQ - 2 Score '2 3 3  '$ Altered sleeping '1 1 3  '$ Tired, decreased energy '1 1 3  '$ Change in appetite '1 1 2  '$ Feeling bad or failure about yourself  '1 1 1  '$ Trouble concentrating 0 2 2  Moving slowly or fidgety/restless 0 1 0  Suicidal thoughts - 0 0  PHQ-9 Score '6 10 14  '$ Difficult doing work/chores - Somewhat difficult Somewhat difficult   GAD 7 : Generalized Anxiety Score 04/15/2020 08/27/2019  Nervous, Anxious, on Edge 1 3  Control/stop worrying 1 3  Worry too much - different things 1 2  Trouble relaxing 1 2  Restless 1 1  Easily annoyed or irritable 1 1  Afraid - awful might happen 0 1  Total GAD 7 Score 6 13  Anxiety Difficulty Not difficult at all Somewhat difficult      Assessment/ Plan: 64 y.o. female   1. Essential hypertension Controlled.  Continue current regimen.  She  will come in for fasting labs  2. Mixed hyperlipidemia Continue Lovaza for now.  If her cholesterol is not well controlled we will try and see if we can get a preauthorization for Vascepa as I do think that that is better, particularly given her family history of early cardiovascular disease - Lipid panel; Future - TSH; Future  3. 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  We discussed again lifestyle modification.  I have given her [facility] [place] information for healthy weight and wellness.  Fasting labs have been placed.  I would like to see her every 6 months given use of Abilify - CMP14+EGFR; Future - CBC; Future - Vitamin B12; Future - VITAMIN D 25 Hydroxy (Vit-D Deficiency, Fractures); Future - Ferritin; Future  4. Establishing care with new doctor, encounter for  5. Elevated serum glucose Check A1c - Bayer DCA Hb A1c Waived; Future  6. Anxiety Managed by her psychiatrist  7. Depression, recurrent ([place]) Continue with therapy   No orders of the defined types were placed in this encounter.  Meds ordered this encounter  Medications  . lisinopril-hydrochlorothiazide (ZESTORETIC) 20-25 MG tablet    Sig: Take 1 tablet by mouth daily.    Dispense:  90 tablet    Refill:  3  . metoprolol succinate (TOPROL-XL) 50 MG 24 hr tablet    Sig: Take 2 tablets (100 mg total) by mouth at bedtime.    Dispense:  180 tablet    Refill:  [place], [place] [phone]

## 2020-04-15 NOTE — Patient Instructions (Addendum)
Please remember to arrive to your appointment 15 minutes prior to your scheduled time.  If you are late to future visits, I may be unable to accommodate your appointment and you may be asked to reschedule.  I have ordered fasting labs for you to come in and get at your convenience.

## 2020-05-01 ENCOUNTER — Telehealth: Payer: Self-pay | Admitting: Family Medicine

## 2020-05-01 NOTE — Telephone Encounter (Signed)
Patient will ask Dr. Leafy Ro to review future lab orders by Dr. Lajuana Ripple and order everything necessary at one time during lab visit.

## 2020-05-01 NOTE — Telephone Encounter (Signed)
Pt is seeing Dr. Leafy Ro on 7/20 and states that Dr. Leafy Ro is wanting her to have lab work. Pt states she is to have lab work for Dr. Lajuana Ripple and her insurance will not cover both. She is wanting to see if these can be coordinated to use for both offices and for her appt on 7/20 with Dr. Leafy Ro? Pt is unsure what all Dr. Leafy Ro wanted to check for her labs. Please advise.

## 2020-05-19 ENCOUNTER — Encounter (INDEPENDENT_AMBULATORY_CARE_PROVIDER_SITE_OTHER): Payer: Self-pay | Admitting: Family Medicine

## 2020-05-19 ENCOUNTER — Other Ambulatory Visit: Payer: Self-pay

## 2020-05-19 ENCOUNTER — Ambulatory Visit (INDEPENDENT_AMBULATORY_CARE_PROVIDER_SITE_OTHER): Payer: Managed Care, Other (non HMO) | Admitting: Family Medicine

## 2020-05-19 VITALS — BP 150/79 | HR 57 | Temp 98.0°F | Ht 62.0 in | Wt 221.0 lb

## 2020-05-19 DIAGNOSIS — D508 Other iron deficiency anemias: Secondary | ICD-10-CM

## 2020-05-19 DIAGNOSIS — R0602 Shortness of breath: Secondary | ICD-10-CM

## 2020-05-19 DIAGNOSIS — E538 Deficiency of other specified B group vitamins: Secondary | ICD-10-CM

## 2020-05-19 DIAGNOSIS — I10 Essential (primary) hypertension: Secondary | ICD-10-CM

## 2020-05-19 DIAGNOSIS — Z0289 Encounter for other administrative examinations: Secondary | ICD-10-CM

## 2020-05-19 DIAGNOSIS — F3289 Other specified depressive episodes: Secondary | ICD-10-CM

## 2020-05-19 DIAGNOSIS — Z9189 Other specified personal risk factors, not elsewhere classified: Secondary | ICD-10-CM | POA: Diagnosis not present

## 2020-05-19 DIAGNOSIS — E66813 Obesity, class 3: Secondary | ICD-10-CM

## 2020-05-19 DIAGNOSIS — E7849 Other hyperlipidemia: Secondary | ICD-10-CM

## 2020-05-19 DIAGNOSIS — Z6841 Body Mass Index (BMI) 40.0 and over, adult: Secondary | ICD-10-CM

## 2020-05-19 DIAGNOSIS — R5383 Other fatigue: Secondary | ICD-10-CM

## 2020-05-19 DIAGNOSIS — Z9884 Bariatric surgery status: Secondary | ICD-10-CM

## 2020-05-20 LAB — COMPREHENSIVE METABOLIC PANEL
ALT: 11 IU/L (ref 0–32)
AST: 18 IU/L (ref 0–40)
Albumin/Globulin Ratio: 2.1 (ref 1.2–2.2)
Albumin: 4.4 g/dL (ref 3.8–4.8)
Alkaline Phosphatase: 105 IU/L (ref 48–121)
BUN/Creatinine Ratio: 20 (ref 12–28)
BUN: 13 mg/dL (ref 8–27)
Bilirubin Total: 0.5 mg/dL (ref 0.0–1.2)
CO2: 26 mmol/L (ref 20–29)
Calcium: 9.6 mg/dL (ref 8.7–10.3)
Chloride: 100 mmol/L (ref 96–106)
Creatinine, Ser: 0.65 mg/dL (ref 0.57–1.00)
GFR calc Af Amer: 110 mL/min/{1.73_m2} (ref >59)
GFR calc non Af Amer: 95 mL/min/{1.73_m2} (ref >59)
Globulin, Total: 2.1 g/dL (ref 1.5–4.5)
Glucose: 95 mg/dL (ref 65–99)
Potassium: 4.1 mmol/L (ref 3.5–5.2)
Sodium: 140 mmol/L (ref 134–144)
Total Protein: 6.5 g/dL (ref 6.0–8.5)

## 2020-05-20 LAB — CBC WITH DIFFERENTIAL/PLATELET
Basophils Absolute: 0.1 10*3/uL (ref 0.0–0.2)
Basos: 1 %
EOS (ABSOLUTE): 0.1 10*3/uL (ref 0.0–0.4)
Eos: 2 %
Hemoglobin: 15 g/dL (ref 11.1–15.9)
Immature Grans (Abs): 0 10*3/uL (ref 0.0–0.1)
Immature Granulocytes: 0 %
Lymphocytes Absolute: 1.1 10*3/uL (ref 0.7–3.1)
Lymphs: 19 %
MCH: 34.1 pg — ABNORMAL HIGH (ref 26.6–33.0)
MCHC: 33.6 g/dL (ref 31.5–35.7)
MCV: 101 fL — ABNORMAL HIGH (ref 79–97)
Monocytes Absolute: 0.4 10*3/uL (ref 0.1–0.9)
Monocytes: 7 %
Neutrophils Absolute: 4.1 10*3/uL (ref 1.4–7.0)
Neutrophils: 71 %
Platelets: 211 10*3/uL (ref 150–450)
RBC: 4.4 x10E6/uL (ref 3.77–5.28)
RDW: 12.9 % (ref 11.7–15.4)
WBC: 5.8 10*3/uL (ref 3.4–10.8)

## 2020-05-20 LAB — ANEMIA PANEL
Ferritin: 67 ng/mL (ref 15–150)
Folate, Hemolysate: 388 ng/mL
Folate, RBC: 870 ng/mL (ref >498)
Hematocrit: 44.6 % (ref 34.0–46.6)
Iron Saturation: 53 % (ref 15–55)
Iron: 198 ug/dL — ABNORMAL HIGH (ref 27–139)
Retic Ct Pct: 1.9 % (ref 0.6–2.6)
Total Iron Binding Capacity: 376 ug/dL (ref 250–450)
UIBC: 178 ug/dL (ref 118–369)
Vitamin B-12: 482 pg/mL (ref 232–1245)

## 2020-05-20 LAB — LIPID PANEL WITH LDL/HDL RATIO
Cholesterol, Total: 231 mg/dL — ABNORMAL HIGH (ref 100–199)
HDL: 60 mg/dL (ref >39)
LDL Chol Calc (NIH): 121 mg/dL — ABNORMAL HIGH (ref 0–99)
LDL/HDL Ratio: 2 ratio (ref 0.0–3.2)
Triglycerides: 290 mg/dL — ABNORMAL HIGH (ref 0–149)
VLDL Cholesterol Cal: 50 mg/dL — ABNORMAL HIGH (ref 5–40)

## 2020-05-20 LAB — FOLATE: Folate: 7.5 ng/mL (ref >3.0)

## 2020-05-20 LAB — HEMOGLOBIN A1C
Est. average glucose Bld gHb Est-mCnc: 97 mg/dL
Hgb A1c MFr Bld: 5 % (ref 4.8–5.6)

## 2020-05-20 LAB — T4, FREE: Free T4: 1.06 ng/dL (ref 0.82–1.77)

## 2020-05-20 LAB — TSH: TSH: 2.76 u[IU]/mL (ref 0.450–4.500)

## 2020-05-20 LAB — T3: T3, Total: 120 ng/dL (ref 71–180)

## 2020-05-20 LAB — INSULIN, RANDOM: INSULIN: 9.4 u[IU]/mL (ref 2.6–24.9)

## 2020-05-20 LAB — VITAMIN D 25 HYDROXY (VIT D DEFICIENCY, FRACTURES): Vit D, 25-Hydroxy: 32.5 ng/mL (ref 30.0–100.0)

## 2020-05-21 NOTE — Progress Notes (Signed)
Chief Complaint:   OBESITY Patricia Carr (MR# [id number]) is a 64 y.o. female who presents for evaluation and treatment of obesity and related comorbidities. Current BMI is Body mass index is 40.42 kg/m. Theora has been struggling with her weight for many years and has been unsuccessful in either losing weight, maintaining weight loss, or reaching her healthy weight goal.  Lavelle is currently in the action stage of change and ready to dedicate time achieving and maintaining a healthier weight. Marlowe is interested in becoming our patient and working on intensive lifestyle modifications including (but not limited to) diet and exercise for weight loss.  Samona's habits were reviewed today and are as follows: Her family eats meals together, she thinks her family will eat healthier with her, her desired weight loss is 71 lbs, she started gaining weight at 52, her heaviest weight ever was 255 pounds, she has significant food cravings issues, she snacks frequently in the evenings, she skips meals frequently, she is frequently drinking liquids with calories, she frequently makes poor food choices and she struggles with emotional eating.  Depression Screen Cherina's Food and Mood (modified PHQ-9) score was 21.  Depression screen PHQ 2/9 05/19/2020  Decreased Interest 3  Down, Depressed, Hopeless 3  PHQ - 2 Score 6  Altered sleeping 2  Tired, decreased energy 3  Change in appetite 3  Feeling bad or failure about yourself  2  Trouble concentrating 2  Moving slowly or fidgety/restless 2  Suicidal thoughts 1  PHQ-9 Score 21  Difficult doing work/chores Somewhat difficult   Subjective:   1. Other fatigue Estephania admits to daytime somnolence and admits to waking up still tired. Patent has a history of symptoms of daytime fatigue. Alexa generally gets 6 hours of sleep per night, and states that she has nightime awakenings. Snoring is present. Apneic episodes are present. Epworth Sleepiness Score is  14.  2. Shortness of breath on exertion Evangelene notes increasing shortness of breath with exercising and seems to be worsening over time with weight gain. She notes getting out of breath sooner with activity than she used to. This has not gotten worse recently. Vera denies shortness of breath at rest or orthopnea.  3. Essential hypertension Zakiah's blood pressure is elevated today. She is on lisinopril, hydrochlorothiazide, and metoprolol. She would like to improve with diet.  4. Vitamin B 12 deficiency Ferrell is on B12 injections monthly. She is status post gastric bypass, and she still notes fatigue.  5. S/P gastric bypass RNY  Raelin is status post gastric bypass in 2012 with Dr. Hassell Done at [facility]. She went from 255 lbs to 145 lbs in 9 months, and started regaining after 4 years. She has a history of dumping syndrome and she still has approximately 2 episodes per week lasting approximately 30 minutes.  6. Other iron deficiency anemia Breezie is status post gastric bypass, and she is on multivitamins and notes fatigue.  7. Other hyperlipidemia Mahaley has a history of elevated cholesterol. She is not on statin and she would like to improve with diet. She notes a family history of early myocardial infarction.  8. Other depression with emotional eating Quinnlyn notes significant emotional eating and feeling judged about her weight. She has a history of depression and anxiety which is being treated with medicine.  9. At risk for heart disease Ashling is at a higher than average risk for cardiovascular disease due to obesity.   Assessment/Plan:   1. Other fatigue  Faelynn does feel that her weight is causing her energy to be lower than it should be. Fatigue may be related to obesity, depression or many other causes. Labs will be ordered, and in the meanwhile, Marsi will focus on self care including making healthy food choices, increasing physical activity and focusing on stress reduction.  - EKG  12-Lead - Anemia panel - CBC with Differential/Platelet - Hemoglobin A1c - Insulin, random - VITAMIN D 25 Hydroxy (Vit-D Deficiency, Fractures) - Folate - T3 - T4, free - TSH  2. Shortness of breath on exertion Nakhia does feel that she gets out of breath more easily that she used to when she exercises. Anyae's shortness of breath appears to be obesity related and exercise induced. She has agreed to work on weight loss and gradually increase exercise to treat her exercise induced shortness of breath. Will continue to monitor closely.  3. Essential hypertension Neriyah will start her Category 2 plan, and will continue working on healthy weight loss and exercise to improve blood pressure control. We will watch for signs of hypotension as she continues her lifestyle modifications. We will check labs today.  - CBC with Differential/Platelet - Comprehensive metabolic panel  4. Vitamin B 12 deficiency The diagnosis was reviewed with the patient. We will check labs today, and will continue to monitor. Jamerica will follow up as directed. Orders and follow up as documented in patient record.  - Anemia panel - Vitamin B12 - Folate  5. S/P gastric bypass RNY  Danyla will start her Category 2 plan, and will follow up as directed.  6. Other iron deficiency anemia We will check labs today. Jla will follow up as directed. Orders and follow up as documented in patient record.   Anemia panel - Folate  7. Other hyperlipidemia Cardiovascular risk and specific lipid/LDL goals reviewed. We discussed several lifestyle modifications today. Capitola will start her Category 2 plan, and will continue to work on exercise and weight loss efforts. We will check labs today. Orders and follow up as documented in patient record.   - Comprehensive metabolic panel - Lipid Panel With LDL/HDL Ratio  8. Other depression with emotional eating Behavior modification techniques were discussed today to help Johnni deal  with her emotional/non-hunger eating behaviors. We will refer to Dr. Mallie Mussel, our Bariatric Psychologist for evaluation. Orders and follow up as documented in patient record.   9. At risk for heart disease Dennisse was given approximately 30 minutes of coronary artery disease prevention counseling today. She is 64 y.o. female and has risk factors for heart disease including obesity. We discussed intensive lifestyle modifications today with an emphasis on specific weight loss instructions and strategies.   Repetitive spaced learning was employed today to elicit superior memory formation and behavioral change.  10. Class 3 severe obesity with serious comorbidity and body mass index (BMI) of 40.0 to 44.9 in adult, unspecified obesity type [facility]) Juliya is currently in the action stage of change and her goal is to continue with weight loss efforts. I recommend Tekoa begin the structured treatment plan as follows:  She has agreed to the Category 2 Plan.  Exercise goals: No exercise has been prescribed for now, while we concentrate on nutritional changes.  Behavioral modification strategies: decreasing eating out and meal planning and cooking strategies.  She was informed of the importance of frequent follow-up visits to maximize her success with intensive lifestyle modifications for her multiple health conditions. She was informed we would discuss her lab results at  her next visit unless there is a critical issue that needs to be addressed sooner. Felina agreed to keep her next visit at the agreed upon time to discuss these results.  Objective:   Blood pressure (!) 150/79, pulse (!) 57, temperature 98 F (36.7 C), temperature source Oral, height 5\' 2"  (1.575 m), weight 221 lb (100.2 kg), last menstrual period 06/01/2015, SpO2 97 %. Body mass index is 40.42 kg/m.  EKG: Normal sinus rhythm, rate 63 BPM.  Indirect Calorimeter completed today shows a VO2 of 264 and a REE of 1839.  Her calculated basal  metabolic rate is 7096 thus her basal metabolic rate is better than expected.  General: Cooperative, alert, well developed, in no acute distress. HEENT: Conjunctivae and lids unremarkable. Cardiovascular: Regular rhythm.  Lungs: Normal work of breathing. Neurologic: No focal deficits.   Lab Results  Component Value Date   CREATININE 0.65 05/19/2020   BUN 13 05/19/2020   NA 140 05/19/2020   K 4.1 05/19/2020   CL 100 05/19/2020   CO2 26 05/19/2020   Lab Results  Component Value Date   ALT 11 05/19/2020   AST 18 05/19/2020   ALKPHOS 105 05/19/2020   BILITOT 0.5 05/19/2020   Lab Results  Component Value Date   HGBA1C 5.0 05/19/2020   Lab Results  Component Value Date   INSULIN 9.4 05/19/2020   Lab Results  Component Value Date   TSH 2.760 05/19/2020   Lab Results  Component Value Date   CHOL 231 (H) 05/19/2020   HDL 60 05/19/2020   LDLCALC 121 (H) 05/19/2020   TRIG 290 (H) 05/19/2020   CHOLHDL 3.2 07/09/2019   Lab Results  Component Value Date   WBC 5.8 05/19/2020   HGB 15.0 05/19/2020   HCT 44.6 05/19/2020   MCV 101 (H) 05/19/2020   PLT 211 05/19/2020   Lab Results  Component Value Date   IRON 198 (H) 05/19/2020   TIBC 376 05/19/2020   FERRITIN 67 05/19/2020   Attestation Statements:   Reviewed by clinician on day of visit: allergies, medications, problem list, medical history, surgical history, family history, social history, and previous encounter notes.   I, Trixie Dredge, am acting as transcriptionist for Dennard Nip, MD.  I have reviewed the above documentation for accuracy and completeness, and I agree with the above. - Dennard Nip, MD

## 2020-06-02 ENCOUNTER — Encounter (INDEPENDENT_AMBULATORY_CARE_PROVIDER_SITE_OTHER): Payer: Self-pay | Admitting: Family Medicine

## 2020-06-02 ENCOUNTER — Other Ambulatory Visit: Payer: Self-pay

## 2020-06-02 ENCOUNTER — Ambulatory Visit (INDEPENDENT_AMBULATORY_CARE_PROVIDER_SITE_OTHER): Payer: Managed Care, Other (non HMO) | Admitting: Family Medicine

## 2020-06-02 VITALS — BP 146/83 | HR 59 | Temp 98.5°F | Ht 62.0 in | Wt 216.0 lb

## 2020-06-02 DIAGNOSIS — I10 Essential (primary) hypertension: Secondary | ICD-10-CM

## 2020-06-02 DIAGNOSIS — Z9189 Other specified personal risk factors, not elsewhere classified: Secondary | ICD-10-CM

## 2020-06-02 DIAGNOSIS — E8881 Metabolic syndrome: Secondary | ICD-10-CM

## 2020-06-02 DIAGNOSIS — Z6839 Body mass index (BMI) 39.0-39.9, adult: Secondary | ICD-10-CM

## 2020-06-02 DIAGNOSIS — E7849 Other hyperlipidemia: Secondary | ICD-10-CM | POA: Diagnosis not present

## 2020-06-02 DIAGNOSIS — E559 Vitamin D deficiency, unspecified: Secondary | ICD-10-CM

## 2020-06-02 MED ORDER — VITAMIN D (ERGOCALCIFEROL) 1.25 MG (50000 UNIT) PO CAPS: 50000.0000 [IU] | ORAL_CAPSULE | ORAL | 0 refills | Status: DC

## 2020-06-02 NOTE — Progress Notes (Signed)
Chief Complaint:   OBESITY Patricia Carr is here to discuss her progress with her obesity treatment plan along with follow-up of her obesity related diagnoses. Patricia Carr is on the Category 2 Plan and states she is following her eating plan approximately 90-95% of the time. Patricia Carr states she is doing 0 minutes 0 times per week.  Today's visit was #: 2 Starting weight: 221 lbs Starting date: 05/19/2020 Today's weight: 216 lbs Today's date: 06/02/2020 Total lbs lost to date: 5 Total lbs lost since last in-office visit: 5  Interim History: Patricia Carr has done well with weight loss on her Category 2 plan. Her hunger is controlled and she sometimes struggled to eat all the protein on her plan.  Subjective:   1. Vitamin D deficiency Patricia Carr, Vit D level is not yet at goal, and she denies nausea or vomiting. I discussed labs with the patient today.  2. Insulin resistance Patricia Carr has a diagnosis of insulin resistance. She has a normal A1c and glucose but her fasting insulin level is >5. She notes polyphagia has decreased with her eating plan. I discussed labs with the patient today.  3. Mixed hyperlipidemia Patricia Carr's triglycerides and LDL are elevated. She is no longer on Niacin or Vescepa and she has gained 25 lbs this year since the pandemic, which is likely affecting her cholesterol level. I discussed labs with the patient today.  4. Essential hypertension Patricia Carr's blood pressure is elevated again today, but has improved from her last visit with diet and weight loss. She denies chest pain.  5. At risk for diabetes mellitus Patricia Carr is at higher than average risk for developing diabetes due to her obesity.   Assessment/Plan:   1. Vitamin D deficiency Low Vitamin D level contributes to fatigue and are associated with obesity, breast, and colon cancer. Patricia Carr agreed to start prescription Vitamin D 50,000 IU every week with no refills. She will follow-up for routine testing of Vitamin D, at least 2-3 times per year  to avoid over-replacement. We will recheck labs in 3 months.  - Vitamin D, Ergocalciferol, (DRISDOL) 1.25 MG (50000 UNIT) CAPS capsule; Take 1 capsule (50,000 Units total) by mouth every 7 (seven) days.  Dispense: 4 capsule; Refill: 0  2. Insulin resistance Patricia Carr will continue to work on weight loss, diet, exercise, and decreasing simple carbohydrates to help decrease the risk of diabetes. We will recheck labs in 3 months. Patricia Carr agreed to follow-up with [place] as directed to closely monitor her progress.  3. Mixed hyperlipidemia Cardiovascular risk and specific lipid/LDL goals reviewed. We discussed several lifestyle modifications today. Patricia Carr will continue to work on diet, exercise and weight loss efforts. We will recheck labs in 3 months. Orders and follow up as documented in patient record.   4. Essential hypertension Patricia Carr will continue her medications, diet, exercise, and healthy weight loss to improve blood pressure control. Will continue to monitor. We will watch for signs of hypotension as she continues her lifestyle modifications.  5. At risk for diabetes mellitus Patricia Carr was given approximately 30 minutes of diabetes education and counseling today. We discussed intensive lifestyle modifications today with an emphasis on weight loss as well as increasing exercise and decreasing simple carbohydrates in her diet. We also reviewed medication options with an emphasis on risk versus benefit of those discussed.   Repetitive spaced learning was employed today to elicit superior memory formation and behavioral change.  6. Class 2 severe obesity with serious comorbidity and body mass index (BMI) of 39.0 to  39.9 in adult, unspecified obesity type [facility]) Patricia Carr is currently in the action stage of change. As such, her goal is to continue with weight loss efforts. She has agreed to the Category 2 Plan with lunch options.   Behavioral modification strategies: increasing lean protein intake and decreasing  simple carbohydrates.  Patricia Carr has agreed to follow-up with our clinic in 2 weeks. She was informed of the importance of frequent follow-up visits to maximize her success with intensive lifestyle modifications for her multiple health conditions.   Objective:   Blood pressure (!) 146/83, pulse (!) 59, temperature 98.5 F (36.9 C), temperature source Oral, height 5\' 2"  (1.575 m), weight 216 lb (98 kg), last menstrual period 06/01/2015, SpO2 94 %. Body mass index is 39.51 kg/m.  General: Cooperative, alert, well developed, in no acute distress. HEENT: Conjunctivae and lids unremarkable. Cardiovascular: Regular rhythm.  Lungs: Normal work of breathing. Neurologic: No focal deficits.   Lab Results  Component Value Date   CREATININE 0.65 05/19/2020   BUN 13 05/19/2020   NA 140 05/19/2020   K 4.1 05/19/2020   CL 100 05/19/2020   CO2 26 05/19/2020   Lab Results  Component Value Date   ALT 11 05/19/2020   AST 18 05/19/2020   ALKPHOS 105 05/19/2020   BILITOT 0.5 05/19/2020   Lab Results  Component Value Date   HGBA1C 5.0 05/19/2020   Lab Results  Component Value Date   INSULIN 9.4 05/19/2020   Lab Results  Component Value Date   TSH 2.760 05/19/2020   Lab Results  Component Value Date   CHOL 231 (H) 05/19/2020   HDL 60 05/19/2020   LDLCALC 121 (H) 05/19/2020   TRIG 290 (H) 05/19/2020   CHOLHDL 3.2 07/09/2019   Lab Results  Component Value Date   WBC 5.8 05/19/2020   HGB 15.0 05/19/2020   HCT 44.6 05/19/2020   MCV 101 (H) 05/19/2020   PLT 211 05/19/2020   Lab Results  Component Value Date   IRON 198 (H) 05/19/2020   TIBC 376 05/19/2020   FERRITIN 67 05/19/2020   Attestation Statements:   Reviewed by clinician on day of visit: allergies, medications, problem list, medical history, surgical history, family history, social history, and previous encounter notes.   I, Trixie Dredge, am acting as transcriptionist for Dennard Nip, MD.  I have reviewed the above  documentation for accuracy and completeness, and I agree with the above. -  Dennard Nip, MD

## 2020-06-18 ENCOUNTER — Other Ambulatory Visit: Payer: Self-pay

## 2020-06-18 ENCOUNTER — Encounter (INDEPENDENT_AMBULATORY_CARE_PROVIDER_SITE_OTHER): Payer: Self-pay | Admitting: Adult Health

## 2020-06-18 ENCOUNTER — Ambulatory Visit (INDEPENDENT_AMBULATORY_CARE_PROVIDER_SITE_OTHER): Payer: Managed Care, Other (non HMO) | Admitting: Adult Health

## 2020-06-18 VITALS — BP 139/82 | HR 80 | Temp 98.0°F | Ht 62.0 in | Wt 217.0 lb

## 2020-06-18 DIAGNOSIS — I10 Essential (primary) hypertension: Secondary | ICD-10-CM

## 2020-06-18 DIAGNOSIS — Z6839 Body mass index (BMI) 39.0-39.9, adult: Secondary | ICD-10-CM

## 2020-06-18 DIAGNOSIS — Z9189 Other specified personal risk factors, not elsewhere classified: Secondary | ICD-10-CM

## 2020-06-18 DIAGNOSIS — E559 Vitamin D deficiency, unspecified: Secondary | ICD-10-CM | POA: Insufficient documentation

## 2020-06-18 MED ORDER — VITAMIN D (ERGOCALCIFEROL) 1.25 MG (50000 UNIT) PO CAPS: 50000.0000 [IU] | ORAL_CAPSULE | ORAL | 0 refills | Status: DC

## 2020-06-18 NOTE — Progress Notes (Signed)
Chief Complaint:   OBESITY Patricia Carr is here to discuss her progress with her obesity treatment plan along with follow-up of her obesity related diagnoses. Patricia Carr is on the Category 2 Plan and states she is following her eating plan approximately 85-90% of the time. Patricia Carr states she is exercising 0 minutes 0 times per week.  Today's visit was #: 3 Starting weight: 221 lbs Starting date: 05/19/2020 Today's weight: 217 lbs Today's date: 06/18/2020 Total lbs lost to date: 4 Total lbs lost since last in-office visit: 0  Interim History: Patricia Carr recently traveled to [place] for a week. She focused on protein/vegetables/complex carbohydrates at each meal. She avoided simple carbohydrates, limited beer intake, and walked frequently while on vacation.  Subjective:   Essential hypertension. Blood pressure is at goal. Patricia Carr is on metoprolol 50 mg QHS and lisinopril/HCTZ 20/25 mg daily. She denies acute cardiac symptoms.  BP Readings from Last 3 Encounters:  06/18/20 139/82  06/02/20 (!) 146/83  05/19/20 (!) 150/79   Lab Results  Component Value Date   CREATININE 0.65 05/19/2020   CREATININE 0.79 07/09/2019   CREATININE 0.77 03/08/2011   Vitamin D deficiency. Patricia Carr is on Ergocalciferol. No nausea, vomiting, or muscle weakness.    Ref. Range 05/19/2020 15:04  Vitamin D, 25-Hydroxy Latest Ref Range: 30.0 - 100.0 ng/mL 32.5   At risk for osteoporosis. Patricia Carr is at higher risk of osteopenia and osteoporosis due to Vitamin D deficiency and obesity.   Assessment/Plan:   Essential hypertension. Patricia Carr is working on healthy weight loss and exercise to improve blood pressure control. We will watch for signs of hypotension as she continues her lifestyle modifications. Patricia Carr will continue BB and ACE/diuretc. She will increase her water intake and limit her sodium intake.  Vitamin D deficiency. Low Vitamin D level contributes to fatigue and are associated with obesity,  breast, and colon cancer. She was given a refill on her Vitamin D, Ergocalciferol, (DRISDOL) 1.25 MG (50000 UNIT) CAPS capsule every week #4 with 0 refills and will follow-up for routine testing of Vitamin D every 3 months.   At risk for osteoporosis. Patricia Carr was given approximately 15 minutes of osteoporosis prevention counseling today. Patricia Carr is at risk for osteopenia and osteoporosis due to her Vitamin D deficiency. She was encouraged to take her Vitamin D and follow her higher calcium diet and increase strengthening exercise to help strengthen her bones and decrease her risk of osteopenia and osteoporosis.  Repetitive spaced learning was employed today to elicit superior memory formation and behavioral change.  Class 2 severe obesity with serious comorbidity and body mass index (BMI) of 39.0 to 39.9 in adult, unspecified obesity type ([place]).  Patricia Carr is currently in the action stage of change. As such, her goal is to continue with weight loss efforts. She has agreed to the Category 2 Plan.   Exercise goals: No exercise has been prescribed at this time.  Behavioral modification strategies: increasing lean protein intake, increasing water intake, meal planning and cooking strategies, travel eating strategies and planning for success.  Patricia Carr has agreed to follow-up with our clinic in 2 weeks. She was informed of the importance of frequent follow-up visits to maximize her success with intensive lifestyle modifications for her multiple health conditions.   Objective:   Blood pressure 139/82, pulse 80, temperature 98 F (36.7 C), temperature source Oral, height 5\' 2"  (1.575 m), weight 217 lb (98.4 kg), last menstrual period 06/01/2015, SpO2 95 %. Body mass index is 39.69 kg/m.  General: Cooperative, alert, well developed, in no acute distress. HEENT: Conjunctivae and lids unremarkable. Cardiovascular: Regular rhythm.  Lungs: Normal work of breathing. Neurologic: No focal deficits.   Lab Results    Component Value Date   CREATININE 0.65 05/19/2020   BUN 13 05/19/2020   NA 140 05/19/2020   K 4.1 05/19/2020   CL 100 05/19/2020   CO2 26 05/19/2020   Lab Results  Component Value Date   ALT 11 05/19/2020   AST 18 05/19/2020   ALKPHOS 105 05/19/2020   BILITOT 0.5 05/19/2020   Lab Results  Component Value Date   HGBA1C 5.0 05/19/2020   Lab Results  Component Value Date   INSULIN 9.4 05/19/2020   Lab Results  Component Value Date   TSH 2.760 05/19/2020   Lab Results  Component Value Date   CHOL 231 (H) 05/19/2020   HDL 60 05/19/2020   LDLCALC 121 (H) 05/19/2020   TRIG 290 (H) 05/19/2020   CHOLHDL 3.2 07/09/2019   Lab Results  Component Value Date   WBC 5.8 05/19/2020   HGB 15.0 05/19/2020   HCT 44.6 05/19/2020   MCV 101 (H) 05/19/2020   PLT 211 05/19/2020   Lab Results  Component Value Date   IRON 198 (H) 05/19/2020   TIBC 376 05/19/2020   FERRITIN 67 05/19/2020   Attestation Statements:   Reviewed by clinician on day of visit: allergies, medications, problem list, medical history, surgical history, family history, social history, and previous encounter notes.  I, Michaelene Song, am acting as Location manager for [employer], NP-C   I have reviewed the above documentation for accuracy and completeness, and I agree with the above. -  Esaw Grandchild, NP

## 2020-06-26 ENCOUNTER — Other Ambulatory Visit (INDEPENDENT_AMBULATORY_CARE_PROVIDER_SITE_OTHER): Payer: Self-pay | Admitting: Family Medicine

## 2020-06-26 DIAGNOSIS — E559 Vitamin D deficiency, unspecified: Secondary | ICD-10-CM

## 2020-07-01 ENCOUNTER — Other Ambulatory Visit: Payer: Self-pay

## 2020-07-01 ENCOUNTER — Encounter (INDEPENDENT_AMBULATORY_CARE_PROVIDER_SITE_OTHER): Payer: Self-pay | Admitting: Adult Health

## 2020-07-01 ENCOUNTER — Ambulatory Visit (INDEPENDENT_AMBULATORY_CARE_PROVIDER_SITE_OTHER): Payer: Managed Care, Other (non HMO) | Admitting: Adult Health

## 2020-07-01 VITALS — BP 141/92 | HR 71 | Temp 97.9°F | Ht 62.0 in | Wt 214.0 lb

## 2020-07-01 DIAGNOSIS — I10 Essential (primary) hypertension: Secondary | ICD-10-CM

## 2020-07-01 DIAGNOSIS — Z6839 Body mass index (BMI) 39.0-39.9, adult: Secondary | ICD-10-CM | POA: Diagnosis not present

## 2020-07-01 DIAGNOSIS — E559 Vitamin D deficiency, unspecified: Secondary | ICD-10-CM

## 2020-07-02 NOTE — Progress Notes (Signed)
Chief Complaint:   OBESITY Patricia Carr is here to discuss her progress with her obesity treatment plan along with follow-up of her obesity related diagnoses. [facility] is on the Category 2 Plan and states she is following her eating plan approximately 90% of the time. Patricia Carr states she is exercising 0 minutes 0 times per week.  Today's visit was #: 4 Starting weight: 221 lbs Starting date: 05/19/2020 Today's weight: 214 lbs Today's date: 07/01/2020 Total lbs lost to date: 7 Total lbs lost since last in-office visit: 3  Interim History: Patricia Carr reports increased stress/anxiety related to recent detection of a left breast lump, which will be addressed with excisional biopsy. 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 She will have a breast biopsy tomorrow and squamous cell treatment 09/13. She has been following the plan as consistently as she can despite these recent concerns.  Subjective:   Essential hypertension. Blood pressure is elevated at today's office visit. She experienced traffic on the way to her office visit and was concerned about being late for her office visit. She reports increased stress/anxiety due to acute health concerns. She is on lisinopril/HCTZ 20/25 mg and metoprolol succinate 50 mg 2 tabs daily QHS. She denies acute cardiac symptoms and denies tobacco/vape use.  BP Readings from Last 3 Encounters:  07/01/20 (!) 141/92  06/18/20 139/82  06/02/20 (!) 146/83   Lab Results  Component Value Date   CREATININE 0.65 05/19/2020   CREATININE 0.79 07/09/2019   CREATININE 0.77 03/08/2011   Vitamin D deficiency. [name] is on Ergocalciferol. No nausea, vomiting, or muscle weakness.    Ref. Range 05/19/2020 15:04  Vitamin D, 25-Hydroxy Latest Ref Range: 30.0 - 100.0 ng/mL 32.5   Assessment/Plan:   Essential hypertension. [name] is working on healthy weight loss and exercise to improve blood pressure control. We will watch  for signs of hypotension as she continues her lifestyle modifications. She will continue her current anti-hypertensive regimen and continue to follow the Category 2 meal plan.  Vitamin D deficiency. Low Vitamin D level contributes to fatigue and are associated with obesity, breast, and colon cancer. She agrees to continue to take Ergocalciferol as directed (no medication refill today) and will follow-up for routine testing of Vitamin D every 3 months.   Class 2 severe obesity with serious comorbidity and body mass index (BMI) of 39.0 to 39.9 in adult, unspecified obesity type ([place]).  Patricia Carr is currently in the action stage of change. As such, her goal is to continue with weight loss efforts. She has agreed to the Category 2 Plan.   Handout was provided on   Exercise goals: No exercise has been prescribed at this time.  Behavioral modification strategies: increasing lean protein intake, meal planning and cooking strategies and planning for success.  Patricia Carr has agreed to follow-up with our clinic in 2 weeks. She was informed of the importance of frequent follow-up visits to maximize her success with intensive lifestyle modifications for her multiple health conditions.   Objective:   Blood pressure (!) 141/92, pulse 71, temperature 97.9 F (36.6 C), temperature source Oral, height 5\' 2"  (1.575 m), weight 214 lb (97.1 kg), last menstrual period 06/01/2015, SpO2 97 %. Body mass index is 39.14 kg/m.  General: Cooperative, alert, well developed, in no acute distress. HEENT: Conjunctivae and lids unremarkable. Cardiovascular: Regular rhythm.  Lungs: Normal work of breathing. Neurologic: No focal deficits.   Lab Results  Component Value  Date   CREATININE 0.65 05/19/2020   BUN 13 05/19/2020   NA 140 05/19/2020   K 4.1 05/19/2020   CL 100 05/19/2020   CO2 26 05/19/2020   Lab Results  Component Value Date   ALT 11 05/19/2020   AST 18 05/19/2020   ALKPHOS 105 05/19/2020   BILITOT 0.5  05/19/2020   Lab Results  Component Value Date   HGBA1C 5.0 05/19/2020   Lab Results  Component Value Date   INSULIN 9.4 05/19/2020   Lab Results  Component Value Date   TSH 2.760 05/19/2020   Lab Results  Component Value Date   CHOL 231 (H) 05/19/2020   HDL 60 05/19/2020   LDLCALC 121 (H) 05/19/2020   TRIG 290 (H) 05/19/2020   CHOLHDL 3.2 07/09/2019   Lab Results  Component Value Date   WBC 5.8 05/19/2020   HGB 15.0 05/19/2020   HCT 44.6 05/19/2020   MCV 101 (H) 05/19/2020   PLT 211 05/19/2020   Lab Results  Component Value Date   IRON 198 (H) 05/19/2020   TIBC 376 05/19/2020   FERRITIN 67 05/19/2020   Attestation Statements:   Reviewed by clinician on day of visit: allergies, medications, problem list, medical history, surgical history, family history, social history, and previous encounter notes.  Time spent on visit including pre-visit chart review and post-visit charting and care was 35 minutes.   I, Michaelene Song, am acting as Location manager for [employer], NP-C   I have reviewed the above documentation for accuracy and completeness, and I agree with the above. -  Esaw Grandchild, NP

## 2020-07-17 ENCOUNTER — Encounter (INDEPENDENT_AMBULATORY_CARE_PROVIDER_SITE_OTHER): Payer: Self-pay | Admitting: Adult Health

## 2020-07-22 ENCOUNTER — Ambulatory Visit (INDEPENDENT_AMBULATORY_CARE_PROVIDER_SITE_OTHER): Payer: Managed Care, Other (non HMO) | Admitting: Adult Health

## 2020-07-25 ENCOUNTER — Other Ambulatory Visit (INDEPENDENT_AMBULATORY_CARE_PROVIDER_SITE_OTHER): Payer: Self-pay | Admitting: Family Medicine

## 2020-07-25 DIAGNOSIS — E559 Vitamin D deficiency, unspecified: Secondary | ICD-10-CM

## 2020-08-13 ENCOUNTER — Other Ambulatory Visit: Payer: Self-pay | Admitting: *Deleted

## 2020-08-13 MED ORDER — CYANOCOBALAMIN 1000 MCG/ML IJ SOLN: 1000.0000 ug | INTRAMUSCULAR | 1 refills | Status: DC

## 2020-09-07 ENCOUNTER — Other Ambulatory Visit: Payer: Self-pay | Admitting: Family Medicine

## 2020-09-18 ENCOUNTER — Other Ambulatory Visit: Payer: Self-pay | Admitting: Family Medicine

## 2020-10-16 ENCOUNTER — Encounter: Payer: Self-pay | Admitting: Family Medicine

## 2020-10-16 ENCOUNTER — Ambulatory Visit (INDEPENDENT_AMBULATORY_CARE_PROVIDER_SITE_OTHER): Payer: Managed Care, Other (non HMO) | Admitting: Family Medicine

## 2020-10-16 ENCOUNTER — Other Ambulatory Visit: Payer: Self-pay

## 2020-10-16 VITALS — BP 122/71 | HR 93 | Temp 99.1°F | Ht 62.0 in | Wt 225.0 lb

## 2020-10-16 DIAGNOSIS — Z85828 Personal history of other malignant neoplasm of skin: Secondary | ICD-10-CM | POA: Insufficient documentation

## 2020-10-16 DIAGNOSIS — Z170 Estrogen receptor positive status [ER+]: Secondary | ICD-10-CM | POA: Insufficient documentation

## 2020-10-16 DIAGNOSIS — I10 Essential (primary) hypertension: Secondary | ICD-10-CM

## 2020-10-16 DIAGNOSIS — E782 Mixed hyperlipidemia: Secondary | ICD-10-CM

## 2020-10-16 DIAGNOSIS — Z6839 Body mass index (BMI) 39.0-39.9, adult: Secondary | ICD-10-CM

## 2020-10-16 DIAGNOSIS — C50212 Malignant neoplasm of upper-inner quadrant of left female breast: Secondary | ICD-10-CM

## 2020-10-16 MED ORDER — ROSUVASTATIN CALCIUM 5 MG PO TABS: 5.0000 mg | ORAL_TABLET | Freq: Every day | ORAL | 3 refills | Status: DC

## 2020-10-16 NOTE — Progress Notes (Signed)
Subjective: CC: Follow-up hyperlipidemia, obesity PCP: Janora Norlander, DO YOV:Patricia Carr is a 64 y.o. female presenting to clinic today for:  1. Hypertension with hyperlipidemia, obesity She is compliant with her lisinopril-hydrochlorothiazide, metoprolol. Patient notes that since she switched over to [place] she has not really found that has been very helpful with her cholesterol. She started seeing Redgie Grayer in [place] and notes that her total cholesterol was 231. LDL was 121 and triglycerides were 290. She was doing much better on the Vasce[place] but her insurance stopped paying for that. She is never been on a statin because she was reluctant to start 1 but she does note a strong family history of cardiovascular disease. No personal chest pain or shortness of breath. She is had some burning at the site of radiation for her breast cancer see below  2. Cancer Patient reports that she was diagnosed with 2 different cancer since her last visit. She had a squamous cell carcinoma on the left side of her nose. She required Mohs procedure and this has gotten it out fully. She is expecting to see her dermatologist, Dr. Nevada Crane, soon for a full-body exam.  Additionally, diagnosed with left-sided breast cancer. 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 She is status post lumpectomy, 16 rounds of radiation treatment . Luckily, the breast cancer was estrogen sensitive and will be starting Femara soon. She is been having some peeling of skin as above. Overall though she has been feeling well and is counting her blessings. She did have genetic testing was told that she had one of the genes that could suggest carrier state and that her daughter is already in a high risk program being surveyed very closely with her mammograms.   ROS: Per HPI  Allergies  Allergen Reactions  . Iodine     REACTION: rash   Past Medical History:  Diagnosis Date  . Anemia   . Anxiety   .  Back pain   . Bronchitis   . Depression   . GERD (gastroesophageal reflux disease)   . Hypertension   . Joint pain   . Knee pain   . Osteoarthritis   . Sinus complaint   . Sleep apnea   . Tires easily   . Vitamin B 12 deficiency   . Wears glasses     Current Outpatient Medications:  .  ARIPiprazole (ABILIFY) 2 MG tablet, Take 2 mg by mouth at bedtime., Disp: , Rfl:  .  Armodafinil 250 MG tablet, Take 250 mg by mouth daily., Disp: , Rfl:  .  Biotin 1000 MCG tablet, Take 1,000 mcg by mouth daily., Disp: , Rfl:  .  clonazePAM (KLONOPIN) 0.5 MG tablet, Take 0.5 mg by mouth 3 (three) times daily as needed for anxiety., Disp: , Rfl:  .  cyanocobalamin (,VITAMIN B-12,) 1000 MCG/ML injection, Inject 1 mL (1,000 mcg total) into the muscle once a week for 4 weeks, then monthly., Disp: 10 mL, Rfl: 0 .  desvenlafaxine (PRISTIQ) 100 MG 24 hr tablet, Take 100 mg by mouth daily., Disp: , Rfl:  .  lisinopril-hydrochlorothiazide (ZESTORETIC) 20-25 MG tablet, Take 1 tablet by mouth daily., Disp: 90 tablet, Rfl: 3 .  metoprolol succinate (TOPROL-XL) 50 MG 24 hr tablet, Take 2 tablets (100 mg total) by mouth at bedtime., Disp: 180 tablet, Rfl: 3 .  multivitamin-iron-minerals-folic acid (CENTRUM) chewable tablet, Chew 1 tablet by mouth daily., Disp: , Rfl:  .  omega-3 acid ethyl esters (LOVAZA)  1 g capsule, Take 2 capsules (2 g total) by mouth 2 (two) times daily., Disp: 120 capsule, Rfl: 0 .  Syringe/Needle, Disp, (SYRINGE 3CC/25GX1") 25G X 1" 3 ML MISC, 1 Units by Does not apply route once a week., Disp: 4 each, Rfl: 12 .  Vitamin D, Ergocalciferol, (DRISDOL) 1.25 MG (50000 UNIT) CAPS capsule, Take 1 capsule (50,000 Units total) by mouth every 7 (seven) days., Disp: 4 capsule, Rfl: 0 Social History   Socioeconomic History  . Marital status: Married    Spouse name: [name]  . Number of children: Not on file  . Years of education: Not on file  . Highest education level: Not on file  Occupational History   . Occupation: Research officer, political party  Tobacco Use  . Smoking status: Never Smoker  . Smokeless tobacco: Never Used  Vaping Use  . Vaping Use: Never used  Substance and Sexual Activity  . Alcohol use: No  . Drug use: Never  . Sexual activity: Yes  Other Topics Concern  . Not on file  Social History Narrative  . Not on file   Social Determinants of Health   Financial Resource Strain: Not on file  Food Insecurity: Not on file  Transportation Needs: Not on file  Physical Activity: Not on file  Stress: Not on file  Social Connections: Not on file  Intimate Partner Violence: Not on file   Family History  Problem Relation Age of Onset  . Heart disease Father   . Hypertension Father   . Sudden death Father   . Sleep apnea Father   . Hyperlipidemia Mother   . Colon cancer Mother   . Diabetes Mother   . Depression Mother   . Anxiety disorder Mother   . Hyperlipidemia Sister     Objective: Office vital signs reviewed. BP 122/71   Pulse 93   Temp 99.1 F (37.3 C) (Temporal)   Ht $R'5\' 2"'BR$  (1.575 m)   Wt 225 lb (102.1 kg)   LMP 06/01/2015   SpO2 96%   BMI 41.15 kg/m   Physical Examination:  General: Awake, alert, well appearing, No acute distress Breast: Left breast with post radiation changes including peeling of the skin, hyperpigmentation and redness. She has evidence of surgical treatment of the areola superiorly. Cardio: regular rate and rhythm, S1S2 heard, no murmurs appreciated Pulm: clear to auscultation bilaterally, no wheezes, rhonchi or rales; normal work of breathing on room air Extremities: warm, well perfused, No edema, cyanosis or clubbing; +2 pulses bilaterally Psych: Mood is stable. Patient is optimistic and pleasant.  Assessment/ Plan: 64 y.o. female   Mixed hyperlipidemia - Plan: rosuvastatin (CRESTOR) 5 MG tablet, CMP14+EGFR, Lipid panel  Essential hypertension  Class 2 severe obesity with serious comorbidity and body mass index (BMI) of 39.0  to 39.9 in adult, unspecified obesity type ([facility])  History of SCC (squamous cell carcinoma) of skin  Malignant neoplasm of upper-inner quadrant of left breast in female, estrogen receptor positive ([facility])  Start Crestor 5 mg daily. I will plan for fasting lipid and LFTs in 2 to 3 months. I would like to see her back in the office as well to make sure that other things are going okay  She will plan to start back with healthy weight and wellness as soon as she has got her breast cancer issues under control.  I have documented the history of squamous cell carcinoma of the skin in her EMR as well as the left breast cancer.  No orders  of the defined types were placed in this encounter.  No orders of the defined types were placed in this encounter.    Janora Norlander, DO [place] [phone]

## 2020-10-16 NOTE — Patient Instructions (Signed)
Crestor 5mg  sent in.  You will likely need a higher dose of this but I want to start low and slow to make sure you can tolerate medication.  Follow-up with me in about 2 to 3 months for fasting cholesterol panel and overall recheck of how things are going.  I have placed future orders for your welcome to come in prior to that visit if you like

## 2020-10-20 ENCOUNTER — Other Ambulatory Visit (INDEPENDENT_AMBULATORY_CARE_PROVIDER_SITE_OTHER): Payer: Self-pay | Admitting: Adult Health

## 2020-10-20 DIAGNOSIS — E559 Vitamin D deficiency, unspecified: Secondary | ICD-10-CM

## 2020-12-18 ENCOUNTER — Ambulatory Visit: Payer: Managed Care, Other (non HMO) | Admitting: Family Medicine

## 2021-01-10 ENCOUNTER — Other Ambulatory Visit: Payer: Self-pay | Admitting: Family Medicine

## 2021-01-14 ENCOUNTER — Other Ambulatory Visit: Payer: Self-pay

## 2021-01-14 ENCOUNTER — Other Ambulatory Visit: Payer: Managed Care, Other (non HMO)

## 2021-01-14 DIAGNOSIS — E782 Mixed hyperlipidemia: Secondary | ICD-10-CM

## 2021-01-15 LAB — CMP14+EGFR
ALT: 14 IU/L (ref 0–32)
AST: 24 IU/L (ref 0–40)
Albumin/Globulin Ratio: 1.9 (ref 1.2–2.2)
Albumin: 4.3 g/dL (ref 3.8–4.8)
Alkaline Phosphatase: 107 IU/L (ref 44–121)
BUN/Creatinine Ratio: 28 (ref 12–28)
BUN: 19 mg/dL (ref 8–27)
Bilirubin Total: 0.3 mg/dL (ref 0.0–1.2)
CO2: 27 mmol/L (ref 20–29)
Calcium: 9.7 mg/dL (ref 8.7–10.3)
Chloride: 101 mmol/L (ref 96–106)
Creatinine, Ser: 0.67 mg/dL (ref 0.57–1.00)
Globulin, Total: 2.3 g/dL (ref 1.5–4.5)
Glucose: 107 mg/dL — ABNORMAL HIGH (ref 65–99)
Potassium: 3.7 mmol/L (ref 3.5–5.2)
Sodium: 142 mmol/L (ref 134–144)
Total Protein: 6.6 g/dL (ref 6.0–8.5)
eGFR: 99 mL/min/{1.73_m2} (ref >59)

## 2021-01-15 LAB — LIPID PANEL
Chol/HDL Ratio: 3.4 ratio (ref 0.0–4.4)
Cholesterol, Total: 188 mg/dL (ref 100–199)
HDL: 55 mg/dL (ref >39)
LDL Chol Calc (NIH): 91 mg/dL (ref 0–99)
Triglycerides: 253 mg/dL — ABNORMAL HIGH (ref 0–149)
VLDL Cholesterol Cal: 42 mg/dL — ABNORMAL HIGH (ref 5–40)

## 2021-01-25 ENCOUNTER — Ambulatory Visit (INDEPENDENT_AMBULATORY_CARE_PROVIDER_SITE_OTHER): Payer: Managed Care, Other (non HMO) | Admitting: Family Medicine

## 2021-01-25 ENCOUNTER — Other Ambulatory Visit: Payer: Self-pay

## 2021-01-25 ENCOUNTER — Encounter: Payer: Self-pay | Admitting: Family Medicine

## 2021-01-25 VITALS — BP 131/75 | HR 82 | Temp 98.0°F | Ht 62.0 in | Wt 224.6 lb

## 2021-01-25 DIAGNOSIS — E559 Vitamin D deficiency, unspecified: Secondary | ICD-10-CM | POA: Diagnosis not present

## 2021-01-25 DIAGNOSIS — H6981 Other specified disorders of Eustachian tube, right ear: Secondary | ICD-10-CM | POA: Diagnosis not present

## 2021-01-25 DIAGNOSIS — E782 Mixed hyperlipidemia: Secondary | ICD-10-CM

## 2021-01-25 DIAGNOSIS — M26621 Arthralgia of right temporomandibular joint: Secondary | ICD-10-CM

## 2021-01-25 MED ORDER — MELOXICAM 15 MG PO TABS
7.5000 mg | ORAL_TABLET | Freq: Every day | ORAL | 3 refills | Status: DC | PRN
Start: 2021-01-25 — End: 2021-09-25

## 2021-01-25 MED ORDER — VITAMIN D (ERGOCALCIFEROL) 1.25 MG (50000 UNIT) PO CAPS: 50000.0000 [IU] | ORAL_CAPSULE | ORAL | 3 refills | Status: DC

## 2021-01-25 NOTE — Progress Notes (Signed)
Subjective: CC: Follow-up hyperlipidemia, right ear pain PCP: Patricia Carr ZOX:WRUEA C Carr is a 65 y.o. female presenting to clinic today for:  1.  Hyperlipidemia Patient has been compliant with the statin.  She does not report any myalgia outside of her normal.  Her LDL reduced from 1 21-93, triglycerides also decreased from 290-253.  No reports of chest pain, shortness of breath.  2.  Right ear pain Patient reports intermittent right-sided ear pain and jaw pain that has been ongoing for a while now.  She does not feel that she can totally shut her jaw all the way.  She had an ear infection on the right side that was treated with antibiotics and she wonders if she has recurrence.  She saw her dentist recently and mention TMJ to them but this was not delved in further.  She does admit to grinding and clenching her teeth.  She does not wear any artificial dentures or other hardware in the mouth.  She does wear CPAP machine at nighttime.  Has not used any specific medications for treatment.  3.  Vitamin D deficiency Patient previously treated with ergocalciferol weekly.  She is no longer seeing healthy weight and wellness and therefore has discontinued vitamin D.  She admits to weight gain.  ROS: Per HPI  Allergies  Allergen Reactions  . Iodine     REACTION: rash   Past Medical History:  Diagnosis Date  . Anemia   . Anxiety   . Back pain   . Bronchitis   . Depression   . GERD (gastroesophageal reflux disease)   . Hypertension   . Joint pain   . Knee pain   . Malignant neoplasm of upper-inner quadrant of left breast in female, estrogen receptor positive (Patricia Carr) 2021   s/p lumpectomy and 16 rounds radiation  . Osteoarthritis   . Sinus complaint   . Sleep apnea   . Squamous cell cancer of skin of nose 2021   Removed at [facility], [facility] Derm: Patricia Carr  . Tires easily   . Vitamin B 12 deficiency   . Wears glasses     Current Outpatient Medications:  .   ARIPiprazole (ABILIFY) 2 MG tablet, Take 2 mg by mouth at bedtime., Disp: , Rfl:  .  Armodafinil 250 MG tablet, Take 250 mg by mouth daily., Disp: , Rfl:  .  Biotin 1000 MCG tablet, Take 1,000 mcg by mouth daily., Disp: , Rfl:  .  clonazePAM (KLONOPIN) 0.5 MG tablet, Take 0.5 mg by mouth 3 (three) times daily as needed for anxiety., Disp: , Rfl:  .  cyanocobalamin (,VITAMIN B-12,) 1000 MCG/ML injection, INJECT 1 ML (1,000 MCG TOTAL) INTO THE MUSCLE EVERY 30 DAYS., Disp: 1 mL, Rfl: 0 .  desvenlafaxine (PRISTIQ) 100 MG 24 hr tablet, Take 100 mg by mouth daily., Disp: , Rfl:  .  lisinopril-hydrochlorothiazide (ZESTORETIC) 20-25 MG tablet, Take 1 tablet by mouth daily., Disp: 90 tablet, Rfl: 3 .  metoprolol succinate (TOPROL-XL) 50 MG 24 hr tablet, Take 2 tablets (100 mg total) by mouth at bedtime., Disp: 180 tablet, Rfl: 3 .  multivitamin-iron-minerals-folic acid (CENTRUM) chewable tablet, Chew 1 tablet by mouth daily., Disp: , Rfl:  .  rosuvastatin (CRESTOR) 5 MG tablet, Take 1 tablet (5 mg total) by mouth daily., Disp: 90 tablet, Rfl: 3 .  Syringe/Needle, Disp, (SYRINGE 3CC/25GX1") 25G X 1" 3 ML MISC, 1 Units by Does not apply route once a week., Disp: 4 each, Rfl: 12  Social History   Socioeconomic History  . Marital status: Married    Spouse name: Patricia Carr  . Number of children: Not on file  . Years of education: Not on file  . Highest education level: Not on file  Occupational History  . Occupation: Research officer, political party  Tobacco Use  . Smoking status: Never Smoker  . Smokeless tobacco: Never Used  Vaping Use  . Vaping Use: Never used  Substance and Sexual Activity  . Alcohol use: No  . Drug use: Never  . Sexual activity: Yes  Other Topics Concern  . Not on file  Social History Narrative  . Not on file   Social Determinants of Health   Financial Resource Strain: Not on file  Food Insecurity: Not on file  Transportation Needs: Not on file  Physical Activity: Not on file   Stress: Not on file  Social Connections: Not on file  Intimate Partner Violence: Not on file   Family History  Problem Relation Age of Onset  . Heart disease Father   . Hypertension Father   . Sudden death Father   . Sleep apnea Father   . Hyperlipidemia Mother   . Colon cancer Mother   . Diabetes Mother   . Depression Mother   . Anxiety disorder Mother   . Hyperlipidemia Sister     Objective: Office vital signs reviewed. BP 131/75   Pulse 82   Temp 98 F (36.7 C) (Temporal)   Ht 5\' 2"  (1.575 m)   Wt 224 lb 9.6 oz (101.9 kg)   LMP 06/01/2015   SpO2 95%   BMI 41.08 kg/m   Physical Examination:  General: Awake, alert, well nourished, No acute distress HEENT:   Jaw: Fullness noted along the right TMJ.  She does have asymmetric glide of the jaw.  She appears to be able to open and close the mouth fully (but she does not feel that the teeth are actually meeting in the back); no palpable crepitus or dislocation of the jaw.  She does have tenderness to palpation over the TMJ on the right  Ears: Mild amounts of wax noted in the right external auditory canal but right TM appears to be intact, nonbulging with normal light reflex and no appreciable effusions.  Left TM normal-appearing as well Cardio: regular rate and rhythm, S1S2 heard, no murmurs appreciated Pulm: Patricia to auscultation bilaterally, no wheezes, rhonchi or rales; normal work of breathing on room air Extremities: warm, well perfused, No edema, cyanosis or clubbing; +2 pulses bilaterally  Assessment/ Plan: 65 y.o. female   Mixed hyperlipidemia  Vitamin D deficiency - Plan: Vitamin D, Ergocalciferol, (DRISDOL) 1.25 MG (50000 UNIT) CAPS capsule  Dysfunction of right eustachian tube  Arthralgia of right temporomandibular joint - Plan: meloxicam (MOBIC) 15 MG tablet  Tolerating statin without difficulty.  Continue current regimen.  Start back on vitamin D given history of deficiency and weight gain.  I believe  that her right ear pain is likely a combination of eustachian tube dysfunction and arthralgia of the right TMJ.  She had Patricia asymmetric glide of the TMJ today and a palpable fullness with associated tenderness over the area.  I placed her on meloxicam.  Encouraged icing the affected area.  Home physical therapy stretches for the jaw also recommended.  We will plan to follow-up in the next 2 weeks for reassessment.  If she has ongoing symptoms, may need to consider formal physical therapy.  We discussed signs and symptoms warranting further evaluation  sooner that would suggest infection of the right ear but again there was none appreciated on today's exam.  No orders of the defined types were placed in this encounter.  No orders of the defined types were placed in this encounter.    Patricia Carr [place] [phone]

## 2021-01-25 NOTE — Patient Instructions (Signed)
Meloxicam daily for pain in jaw, Ice, range of motion exercises below. Get a mouth guard for night use. See me in about 2 weeks for recheck.  Eustachian Tube Dysfunction  Eustachian tube dysfunction refers to a condition in which a blockage develops in the narrow passage that connects the middle ear to the back of the nose (eustachian tube). The eustachian tube regulates air pressure in the middle ear by letting air move between the ear and nose. It also helps to drain fluid from the middle ear space. Eustachian tube dysfunction can affect one or both ears. When the eustachian tube does not function properly, air pressure, fluid, or both can build up in the middle ear. What are the causes? This condition occurs when the eustachian tube becomes blocked or cannot open normally. Common causes of this condition include:  Ear infections.  Colds and other infections that affect the nose, mouth, and throat (upper respiratory tract).  Allergies.  Irritation from cigarette smoke.  Irritation from stomach acid coming up into the esophagus (gastroesophageal reflux). The esophagus is the tube that carries food from the mouth to the stomach.  Sudden changes in air pressure, such as from descending in an airplane or scuba diving.  Abnormal growths in the nose or throat, such as: ? Growths that line the nose (nasal polyps). ? Abnormal growth of cells (tumors). ? Enlarged tissue at the back of the throat (adenoids). What increases the risk? You are more likely to develop this condition if:  You smoke.  You are overweight.  You are a child who has: ? Certain birth defects of the mouth, such as cleft palate. ? Large tonsils or adenoids. What are the signs or symptoms? Common symptoms of this condition include:  A feeling of fullness in the ear.  Ear pain.  Clicking or popping noises in the ear.  Ringing in the ear.  Hearing loss.  Loss of balance.  Dizziness. Symptoms may get worse  when the air pressure around you changes, such as when you travel to an area of high elevation, fly on an airplane, or go scuba diving. How is this diagnosed? This condition may be diagnosed based on:  Your symptoms.  A physical exam of your ears, nose, and throat.  Tests, such as those that measure: ? The movement of your eardrum (tympanogram). ? Your hearing (audiometry). How is this treated? Treatment depends on the cause and severity of your condition.  In mild cases, you may relieve your symptoms by moving air into your ears. This is called "popping the ears."  In more severe cases, or if you have symptoms of fluid in your ears, treatment may include: ? Medicines to relieve congestion (decongestants). ? Medicines that treat allergies (antihistamines). ? Nasal sprays or ear drops that contain medicines that reduce swelling (steroids). ? A procedure to drain the fluid in your eardrum (myringotomy). In this procedure, a small tube is placed in the eardrum to:  Drain the fluid.  Restore the air in the middle ear space. ? A procedure to insert a balloon device through the nose to inflate the opening of the eustachian tube (balloon dilation). Follow these instructions at home: Lifestyle  Do not do any of the following until your health care provider approves: ? Travel to high altitudes. ? Fly in airplanes. ? Work in a Pension scheme manager or room. ? Scuba dive.  Do not use any products that contain nicotine or tobacco, such as cigarettes and e-cigarettes. If you need help  quitting, ask your health care provider.  Keep your ears dry. Wear fitted earplugs during showering and bathing. Dry your ears completely after. General instructions  Take over-the-counter and prescription medicines only as told by your health care provider.  Use techniques to help pop your ears as recommended by your health care provider. These may include: ? Chewing gum. ? Yawning. ? Frequent, forceful  swallowing. ? Closing your mouth, holding your nose closed, and gently blowing as if you are trying to blow air out of your nose.  Keep all follow-up visits as told by your health care provider. This is important. Contact a health care provider if:  Your symptoms do not go away after treatment.  Your symptoms come back after treatment.  You are unable to pop your ears.  You have: ? A fever. ? Pain in your ear. ? Pain in your head or neck. ? Fluid draining from your ear.  Your hearing suddenly changes.  You become very dizzy.  You lose your balance. Summary  Eustachian tube dysfunction refers to a condition in which a blockage develops in the eustachian tube.  It can be caused by ear infections, allergies, inhaled irritants, or abnormal growths in the nose or throat.  Symptoms include ear pain, hearing loss, or ringing in the ears.  Mild cases are treated with maneuvers to unblock the ears, such as yawning or ear popping.  Severe cases are treated with medicines. Surgery may also be done (rare). This information is not intended to replace advice given to you by your health care provider. Make sure you discuss any questions you have with your health care provider. Document Revised: 02/06/2018 Document Reviewed: 02/06/2018 Elsevier Patient Education  [place]. Oral and maxillofacial surgery (3rd ed.). Elsevier. Retrieved from https://www.clinicalkey.com">  Jaw Range of Motion Exercises Jaw range of motion exercises are exercises that help your jaw move better. Exercises that help you have good posture (postural exercises) also help relieve jaw discomfort. These are often done along with range of motion exercises. These exercises can help prevent or improve:  Difficulty opening your mouth.  Pain in your jaw while it is open or closed.  Temporomandibular joint (TMJ) pain.  Headache caused by jaw tension. Take other actions to prevent or relieve jaw pain, such  as:  Avoiding things that cause or increase jaw pain. This may include: ? Chewing gum or eating hard foods. ? Clenching your jaw or teeth, grinding your teeth, or keeping tension in your jaw muscles. ? Opening your mouth wide, such as for a big yawn. ? Leaning on your jaw, such as resting your jaw in your hand while leaning on a desk.  Putting ice on your jaw. ? Put ice in a plastic bag. ? Place a towel between your skin and the bag. ? Leave the ice on for 10-15 minutes, 2-3 times a day. Only do jaw exercises that your health care provider approves of. Only move your jaw as far as it can comfortably go in each direction. Do not move your jaw into positions that cause pain. Range of motion exercises Repeat each of these exercises 8 times, 1-2 times a day, or as told by your health care provider. Exercise A: Forward protrusion 1. Push your jaw forward. Hold this position for 1-2 seconds. 2. Allow your jaw to return to its normal position and rest it there for 1-2 seconds. Exercise B: Controlled opening 1. Stand or sit in front of a mirror. Place your tongue on  the roof of your mouth, just behind your top teeth. 2. Keeping your tongue on the roof of your mouth, slowly open and close your mouth. 3. While you open and close your mouth, watch your jaw in the mirror. Try to keep your jaw from moving to one side or the other. Exercise C: Right and left motion 1. Move your jaw right. Hold this position for 1-2 seconds. Allow your jaw to return to its normal position, and rest it there for 1-2 seconds. 2. Move your jaw left. Hold this position for 1-2 seconds. Allow your jaw to return to its normal position, and rest it there for 1-2 seconds. Postural exercises Exercise A: Chin tucks 1. You can do this exercise sitting, standing, or lying down. 2. Move your head straight back, keeping your head level. You can guide the movement by placing your fingers on your chin to push your jaw back in an even  motion. You should be able to feel a double chin form at the end of the motion. 3. Hold this position for 5 seconds. Repeat 10-15 times. Exercise B: Shoulder blade squeeze 1. Sit or stand. 2. Bend your elbows to about 90 degrees, which is the shape of a capital letter "L." Keep your upper arms by your body. 3. Squeeze your shoulder blades down and back, as though you were trying to touch your elbows behind you. Do not shrug your shoulders or move your head. 4. Hold this position for 5 seconds. Repeat 10-15 times. Exercise C: Chest stretch 1. Stand facing a corner. 2. Put both of your hands and your forearms on the wall, with your arms wide apart. 3. Make sure your arms are at a 90-degree angle to your body. This means that you should hold your arms straight out from your body, level with the floor. 4. Step in toward the corner. Do not lean in. 5. Hold this position for 30 seconds. Repeat 3 times. Contact a health care provider if you have:  Jaw pain that is new or gets worse.  Clicking or popping sounds while doing the exercises. Get help right away if:  Your jaw is stuck in one place and you cannot move it.  You cannot open or close your mouth. Summary  Jaw range of motion exercises are exercises that help your jaw move better.  Take actions to prevent or relieve jaw pain: limit chewing gum or eating hard foods; clenching your jaw or teeth; or leaning on your jaw, such as resting your jaw in your hand while leaning on a desk.  Repeat each of the jaw range of motion exercises 8 times, 1-2 times a day, or as told by your health care provider.  Contact a health care provider if you have clicking or popping sounds while doing the exercises. This information is not intended to replace advice given to you by your health care provider. Make sure you discuss any questions you have with your health care provider. Document Revised: 07/10/2020 Document Reviewed: 07/10/2020 Elsevier Patient  Education  2021 [employer].

## 2021-02-08 ENCOUNTER — Other Ambulatory Visit: Payer: Self-pay | Admitting: Family Medicine

## 2021-02-08 ENCOUNTER — Ambulatory Visit: Payer: Self-pay | Admitting: Family Medicine

## 2021-04-20 ENCOUNTER — Other Ambulatory Visit: Payer: Self-pay | Admitting: Family Medicine

## 2021-04-26 ENCOUNTER — Other Ambulatory Visit: Payer: Self-pay | Admitting: Family Medicine

## 2021-04-26 DIAGNOSIS — I10 Essential (primary) hypertension: Secondary | ICD-10-CM

## 2021-07-25 ENCOUNTER — Other Ambulatory Visit: Payer: Self-pay | Admitting: Family Medicine

## 2021-07-25 DIAGNOSIS — I10 Essential (primary) hypertension: Secondary | ICD-10-CM

## 2021-08-11 ENCOUNTER — Other Ambulatory Visit: Payer: Self-pay | Admitting: Family Medicine

## 2021-08-11 ENCOUNTER — Telehealth: Payer: Self-pay | Admitting: Family Medicine

## 2021-08-11 DIAGNOSIS — C50212 Malignant neoplasm of upper-inner quadrant of left female breast: Secondary | ICD-10-CM

## 2021-08-11 DIAGNOSIS — Z6839 Body mass index (BMI) 39.0-39.9, adult: Secondary | ICD-10-CM

## 2021-08-11 DIAGNOSIS — I10 Essential (primary) hypertension: Secondary | ICD-10-CM

## 2021-08-11 DIAGNOSIS — E559 Vitamin D deficiency, unspecified: Secondary | ICD-10-CM

## 2021-08-11 DIAGNOSIS — Z170 Estrogen receptor positive status [ER+]: Secondary | ICD-10-CM

## 2021-08-11 DIAGNOSIS — E782 Mixed hyperlipidemia: Secondary | ICD-10-CM

## 2021-08-11 NOTE — Telephone Encounter (Signed)
Labs are in. Please have her come fasting

## 2021-08-11 NOTE — Telephone Encounter (Signed)
Left detailed message.   

## 2021-08-19 ENCOUNTER — Other Ambulatory Visit: Payer: Self-pay | Admitting: Family Medicine

## 2021-08-19 DIAGNOSIS — I10 Essential (primary) hypertension: Secondary | ICD-10-CM

## 2021-09-21 ENCOUNTER — Other Ambulatory Visit: Payer: Managed Care, Other (non HMO)

## 2021-09-21 ENCOUNTER — Other Ambulatory Visit: Payer: Self-pay

## 2021-09-21 DIAGNOSIS — Z170 Estrogen receptor positive status [ER+]: Secondary | ICD-10-CM

## 2021-09-21 DIAGNOSIS — E559 Vitamin D deficiency, unspecified: Secondary | ICD-10-CM

## 2021-09-21 DIAGNOSIS — E782 Mixed hyperlipidemia: Secondary | ICD-10-CM

## 2021-09-21 DIAGNOSIS — Z6839 Body mass index (BMI) 39.0-39.9, adult: Secondary | ICD-10-CM

## 2021-09-21 DIAGNOSIS — C50212 Malignant neoplasm of upper-inner quadrant of left female breast: Secondary | ICD-10-CM

## 2021-09-21 DIAGNOSIS — I10 Essential (primary) hypertension: Secondary | ICD-10-CM

## 2021-09-21 LAB — BAYER DCA HB A1C WAIVED: HB A1C (BAYER DCA - WAIVED): 5.2 % (ref 4.8–5.6)

## 2021-09-22 LAB — CMP14+EGFR
ALT: 10 IU/L (ref 0–32)
AST: 19 IU/L (ref 0–40)
Albumin/Globulin Ratio: 2.3 — ABNORMAL HIGH (ref 1.2–2.2)
Albumin: 4.9 g/dL — ABNORMAL HIGH (ref 3.8–4.8)
Alkaline Phosphatase: 115 IU/L (ref 44–121)
BUN/Creatinine Ratio: 14 (ref 12–28)
BUN: 11 mg/dL (ref 8–27)
Bilirubin Total: 0.5 mg/dL (ref 0.0–1.2)
CO2: 25 mmol/L (ref 20–29)
Calcium: 9.7 mg/dL (ref 8.7–10.3)
Chloride: 100 mmol/L (ref 96–106)
Creatinine, Ser: 0.81 mg/dL (ref 0.57–1.00)
Globulin, Total: 2.1 g/dL (ref 1.5–4.5)
Glucose: 121 mg/dL — ABNORMAL HIGH (ref 70–99)
Potassium: 3.9 mmol/L (ref 3.5–5.2)
Sodium: 143 mmol/L (ref 134–144)
Total Protein: 7 g/dL (ref 6.0–8.5)
eGFR: 82 mL/min/{1.73_m2} (ref >59)

## 2021-09-22 LAB — VITAMIN D 25 HYDROXY (VIT D DEFICIENCY, FRACTURES): Vit D, 25-Hydroxy: 48.7 ng/mL (ref 30.0–100.0)

## 2021-09-22 LAB — CBC
Hematocrit: 47.5 % — ABNORMAL HIGH (ref 34.0–46.6)
Hemoglobin: 16 g/dL — ABNORMAL HIGH (ref 11.1–15.9)
MCH: 32.6 pg (ref 26.6–33.0)
MCHC: 33.7 g/dL (ref 31.5–35.7)
MCV: 97 fL (ref 79–97)
Platelets: 230 10*3/uL (ref 150–450)
RBC: 4.91 x10E6/uL (ref 3.77–5.28)
RDW: 12.4 % (ref 11.7–15.4)
WBC: 5.4 10*3/uL (ref 3.4–10.8)

## 2021-09-22 LAB — LIPID PANEL
Chol/HDL Ratio: 3.4 ratio (ref 0.0–4.4)
Cholesterol, Total: 168 mg/dL (ref 100–199)
HDL: 50 mg/dL (ref >39)
LDL Chol Calc (NIH): 81 mg/dL (ref 0–99)
Triglycerides: 221 mg/dL — ABNORMAL HIGH (ref 0–149)
VLDL Cholesterol Cal: 37 mg/dL (ref 5–40)

## 2021-09-22 LAB — TSH: TSH: 2 u[IU]/mL (ref 0.450–4.500)

## 2021-09-25 ENCOUNTER — Other Ambulatory Visit: Payer: Self-pay | Admitting: Family Medicine

## 2021-09-25 DIAGNOSIS — M26621 Arthralgia of right temporomandibular joint: Secondary | ICD-10-CM

## 2021-09-28 ENCOUNTER — Encounter: Payer: Managed Care, Other (non HMO) | Admitting: Family Medicine

## 2021-09-28 ENCOUNTER — Encounter: Payer: Self-pay | Admitting: Family Medicine

## 2021-09-28 NOTE — Progress Notes (Deleted)
Patricia Carr is a 65 y.o. female presents to office today for annual physical exam examination.    Concerns today include: 1. ***  Occupation: ***, Marital status: ***, Substance use: *** Diet: ***, Exercise: *** Last colonoscopy: UTD Last mammogram: needs Last pap smear: UTD Refills needed today: *** Immunizations needed: PNA Immunization History  Administered Date(s) Administered   Influenza Split 07/13/2010, 08/16/2011   Influenza, Seasonal, Injecte, Preservative Fre 07/31/2012, 08/06/2013, 08/11/2015, 07/31/2016   Influenza,inj,Quad PF,6+ Mos 08/01/2017, 08/08/2018, 07/23/2019   Influenza,inj,Quad PF,6-35 Mos 08/02/2016   Influenza,inj,quad, With Preservative 08/02/2016, 08/01/2017, 08/08/2018   Influenza-Unspecified 08/13/2020   PFIZER(Purple Top)SARS-COV-2 Vaccination 11/20/2019, 12/11/2019, 08/13/2020   Tdap 09/27/2016   Zoster Recombinat (Shingrix) 07/23/2019, 04/23/2020     Past Medical History:  Diagnosis Date   Anemia    Anxiety    Back pain    Bronchitis    Depression    GERD (gastroesophageal reflux disease)    Hypertension    Joint pain    Knee pain    Malignant neoplasm of upper-inner quadrant of left breast in female, estrogen receptor positive ([place]) 2021   s/p lumpectomy and 16 rounds radiation   Osteoarthritis    Sinus complaint    Sleep apnea    Squamous cell cancer of skin of nose 2021   Removed at [place], Primary Derm: Dr Nevada Crane   Tires easily    Vitamin B 12 deficiency    Wears glasses    Social History   Socioeconomic History   Marital status: Married    Spouse name: Medical sales representative   Number of children: Not on file   Years of education: Not on file   Highest education level: Not on file  Occupational History   Occupation: Research officer, political party  Tobacco Use   Smoking status: Never   Smokeless tobacco: Never  Vaping Use   Vaping Use: Never used  Substance and Sexual Activity   Alcohol use: No   Drug use: Never   Sexual  activity: Yes  Other Topics Concern   Not on file  Social History Narrative   Not on file   Social Determinants of Health   Financial Resource Strain: Not on file  Food Insecurity: Not on file  Transportation Needs: Not on file  Physical Activity: Not on file  Stress: Not on file  Social Connections: Not on file  Intimate Partner Violence: Not on file   Past Surgical History:  Procedure Laterality Date   BREAST LUMPECTOMY WITH AXILLARY LYMPH NODE BIOPSY  2021   LEFT   COLONOSCOPY     REPLACEMENT TOTAL KNEE Right    ROUX-EN-Y PROCEDURE  03/2011   TUBAL LIGATION  1985   Family History  Problem Relation Age of Onset   Heart disease Father    Hypertension Father    Sudden death Father    Sleep apnea Father    Hyperlipidemia Mother    Colon cancer Mother    Diabetes Mother    Depression Mother    Anxiety disorder Mother    Hyperlipidemia Sister     Current Outpatient Medications:    ARIPiprazole (ABILIFY) 2 MG tablet, Take 2 mg by mouth at bedtime., Disp: , Rfl:    Armodafinil 250 MG tablet, Take 250 mg by mouth daily., Disp: , Rfl:    Biotin 1000 MCG tablet, Take 1,000 mcg by mouth daily., Disp: , Rfl:    clonazePAM (KLONOPIN) 0.5 MG tablet, Take 0.5 mg by mouth 3 (three) times daily  as needed for anxiety., Disp: , Rfl:    cyanocobalamin (,VITAMIN B-12,) 1000 MCG/ML injection, INJECT 1 ML (1,000 MCG TOTAL) INTO THE MUSCLE EVERY 30 DAYS., Disp: 1 mL, Rfl: 5   desvenlafaxine (PRISTIQ) 100 MG 24 hr tablet, Take 100 mg by mouth daily., Disp: , Rfl:    letrozole (FEMARA) 2.5 MG tablet, Take by mouth., Disp: , Rfl:    lisinopril-hydrochlorothiazide (ZESTORETIC) 20-25 MG tablet, TAKE 1 TABLET BY MOUTH EVERY DAY, Disp: 90 tablet, Rfl: 3   meloxicam (MOBIC) 15 MG tablet, TAKE 1/2-1 TABLET (7.5-15 MG TOTAL) BY MOUTH DAILY AS NEEDED FOR PAIN., Disp: 30 tablet, Rfl: 0   metoprolol succinate (TOPROL-XL) 50 MG 24 hr tablet, Take 2 tablets (100 mg total) by mouth at bedtime., Disp: 180  tablet, Rfl: 0   multivitamin-iron-minerals-folic acid (CENTRUM) chewable tablet, Chew 1 tablet by mouth daily., Disp: , Rfl:    rosuvastatin (CRESTOR) 5 MG tablet, Take 1 tablet (5 mg total) by mouth daily., Disp: 90 tablet, Rfl: 3   STUDY - ASPIRE - aspirin 81 mg or placebo tablet (PI-Sethi), Take by mouth., Disp: , Rfl:    Syringe/Needle, Disp, (SYRINGE 3CC/25GX1") 25G X 1" 3 ML MISC, 1 Units by Does not apply route once a week., Disp: 4 each, Rfl: 12   Vitamin D, Ergocalciferol, (DRISDOL) 1.25 MG (50000 UNIT) CAPS capsule, Take 1 capsule (50,000 Units total) by mouth every 7 (seven) days., Disp: 12 capsule, Rfl: 3  Allergies  Allergen Reactions   Iodine     REACTION: rash     ROS: Review of Systems {ros; complete:30496}    Physical exam {Exam, Complete:[phone]}    Assessment/ Plan: Patricia Carr here for annual physical exam.   No problem-specific Assessment & Plan notes found for this encounter.   Counseled on healthy lifestyle choices, including diet (rich in fruits, vegetables and lean meats and low in salt and simple carbohydrates) and exercise (at least 30 minutes of moderate physical activity daily).  Patient to follow up in 1 year for annual exam or sooner if needed.  Cheralyn Oliver M. Lajuana Ripple, DO

## 2021-10-01 ENCOUNTER — Ambulatory Visit (INDEPENDENT_AMBULATORY_CARE_PROVIDER_SITE_OTHER): Payer: Managed Care, Other (non HMO) | Admitting: Family Medicine

## 2021-10-01 ENCOUNTER — Encounter: Payer: Self-pay | Admitting: Family Medicine

## 2021-10-01 VITALS — BP 160/110 | HR 92 | Temp 98.3°F | Ht 64.0 in | Wt 223.2 lb

## 2021-10-01 DIAGNOSIS — Z23 Encounter for immunization: Secondary | ICD-10-CM

## 2021-10-01 DIAGNOSIS — Z0001 Encounter for general adult medical examination with abnormal findings: Secondary | ICD-10-CM

## 2021-10-01 DIAGNOSIS — E538 Deficiency of other specified B group vitamins: Secondary | ICD-10-CM | POA: Diagnosis not present

## 2021-10-01 DIAGNOSIS — C50212 Malignant neoplasm of upper-inner quadrant of left female breast: Secondary | ICD-10-CM

## 2021-10-01 DIAGNOSIS — Z9884 Bariatric surgery status: Secondary | ICD-10-CM

## 2021-10-01 DIAGNOSIS — Z170 Estrogen receptor positive status [ER+]: Secondary | ICD-10-CM

## 2021-10-01 DIAGNOSIS — I10 Essential (primary) hypertension: Secondary | ICD-10-CM

## 2021-10-01 DIAGNOSIS — Z0000 Encounter for general adult medical examination without abnormal findings: Secondary | ICD-10-CM

## 2021-10-01 MED ORDER — CYANOCOBALAMIN 1000 MCG/ML IJ SOLN
1000.0000 ug | Freq: Once | INTRAMUSCULAR | Status: AC
  Administered 2021-10-01: 1000 ug via INTRAMUSCULAR

## 2021-10-01 MED ORDER — CYANOCOBALAMIN 1000 MCG/ML IJ SOLN: INTRAMUSCULAR | 12 refills | Status: DC

## 2021-10-01 NOTE — Progress Notes (Signed)
Patricia Carr is a 65 y.o. female presents to office today for annual physical exam examination.    Concerns today include: 1.  Anxiety, stress Patient with increased anxiety and stress related to the care of her mother.  She notes her mother continues to decline.  She in fact sustained a fall recently.  She notes that her mother has admitted to being ready to go into assisted living.  They are currently looking into [facility] for possible [place] echo.  She has friends and visit there is areas.  2.  Vitamin B12 deficiency Patient is compliant with intramuscular injections of B12 each month.  These are administered by her daughter.  She is due for 1 now and would like to get this done in office but a prescription sent to her pharmacy.  Denies any balance issues, sensory issues.  3.  Hypertension with hyperlipidemia Patient is compliant with her medications but admits that she has not taken anything this morning.  No chest pain, shortness of breath, headache or dizziness  Diet: Fair, Exercise: No structured Last colonoscopy: Up-to-date Last mammogram: Up-to-date, patient is treated with medicine for breast cancer Last pap smear: Up to date Refills needed today: Vitamin B12 Immunizations needed: Immunization History  Administered Date(s) Administered   Influenza Split 07/13/2010, 08/16/2011, 07/31/2021   Influenza, Seasonal, Injecte, Preservative Fre 07/31/2012, 08/06/2013, 08/11/2015, 07/31/2016   Influenza,inj,Quad PF,6+ Mos 08/01/2017, 08/08/2018, 07/23/2019   Influenza,inj,Quad PF,6-35 Mos 08/02/2016   Influenza,inj,quad, With Preservative 08/02/2016, 08/01/2017, 08/08/2018, 07/23/2019   Influenza-Unspecified 08/13/2020, 07/19/2021   PFIZER(Purple Top)SARS-COV-2 Vaccination 11/20/2019, 12/11/2019, 08/13/2020, 04/01/2021   Pfizer Covid-19 Vaccine Bivalent Booster 34yrs & up 07/27/2021   Tdap 09/27/2016   Zoster Recombinat (Shingrix) 07/23/2019, 04/23/2020     Past Medical  History:  Diagnosis Date   Anemia    Anxiety    Back pain    Bronchitis    Depression    GERD (gastroesophageal reflux disease)    Hypertension    Joint pain    Knee pain    Malignant neoplasm of upper-inner quadrant of left breast in female, estrogen receptor positive ([place]) 2021   s/p lumpectomy and 16 rounds radiation   Osteoarthritis    Sinus complaint    Sleep apnea    Squamous cell cancer of skin of nose 2021   Removed at [facility], Primary Derm: Dr Nevada Crane   Tires easily    Vitamin B 12 deficiency    Wears glasses    Social History   Socioeconomic History   Marital status: Married    Spouse name: Jimmy   Number of children: Not on file   Years of education: Not on file   Highest education level: Not on file  Occupational History   Occupation: Research officer, political party  Tobacco Use   Smoking status: Never   Smokeless tobacco: Never  Vaping Use   Vaping Use: Never used  Substance and Sexual Activity   Alcohol use: No   Drug use: Never   Sexual activity: Yes  Other Topics Concern   Not on file  Social History Narrative   Not on file   Social Determinants of Health   Financial Resource Strain: Not on file  Food Insecurity: Not on file  Transportation Needs: Not on file  Physical Activity: Not on file  Stress: Not on file  Social Connections: Not on file  Intimate Partner Violence: Not on file   Past Surgical History:  Procedure Laterality Date   BREAST LUMPECTOMY WITH AXILLARY  LYMPH NODE BIOPSY  2021   LEFT   COLONOSCOPY     REPLACEMENT TOTAL KNEE Right    ROUX-EN-Y PROCEDURE  03/2011   TUBAL LIGATION  1985   Family History  Problem Relation Age of Onset   Heart disease Father    Hypertension Father    Sudden death Father    Sleep apnea Father    Hyperlipidemia Mother    Colon cancer Mother    Diabetes Mother    Depression Mother    Anxiety disorder Mother    Hyperlipidemia Sister     Current Outpatient Medications:    ARIPiprazole  (ABILIFY) 2 MG tablet, Take 2 mg by mouth at bedtime., Disp: , Rfl:    Armodafinil 250 MG tablet, Take 250 mg by mouth daily., Disp: , Rfl:    Biotin 1000 MCG tablet, Take 1,000 mcg by mouth daily., Disp: , Rfl:    clonazePAM (KLONOPIN) 0.5 MG tablet, Take 0.5 mg by mouth 3 (three) times daily as needed for anxiety., Disp: , Rfl:    desvenlafaxine (PRISTIQ) 100 MG 24 hr tablet, Take 100 mg by mouth daily., Disp: , Rfl:    lisinopril-hydrochlorothiazide (ZESTORETIC) 20-25 MG tablet, TAKE 1 TABLET BY MOUTH EVERY DAY, Disp: 90 tablet, Rfl: 3   metoprolol succinate (TOPROL-XL) 50 MG 24 hr tablet, Take 2 tablets (100 mg total) by mouth at bedtime., Disp: 180 tablet, Rfl: 0   multivitamin-iron-minerals-folic acid (CENTRUM) chewable tablet, Chew 1 tablet by mouth daily., Disp: , Rfl:    rosuvastatin (CRESTOR) 5 MG tablet, Take 1 tablet (5 mg total) by mouth daily., Disp: 90 tablet, Rfl: 3   Syringe/Needle, Disp, (SYRINGE 3CC/25GX1") 25G X 1" 3 ML MISC, 1 Units by Does not apply route once a week., Disp: 4 each, Rfl: 12   Vitamin D, Ergocalciferol, (DRISDOL) 1.25 MG (50000 UNIT) CAPS capsule, Take 1 capsule (50,000 Units total) by mouth every 7 (seven) days., Disp: 12 capsule, Rfl: 3   cyanocobalamin (,VITAMIN B-12,) 1000 MCG/ML injection, INJECT 1 ML (1,000 MCG TOTAL) INTO THE MUSCLE EVERY 30 DAYS. (Patient not taking: Reported on 10/01/2021), Disp: 1 mL, Rfl: 5   letrozole (FEMARA) 2.5 MG tablet, Take by mouth. (Patient not taking: Reported on 10/01/2021), Disp: , Rfl:    meloxicam (MOBIC) 15 MG tablet, TAKE 1/2-1 TABLET (7.5-15 MG TOTAL) BY MOUTH DAILY AS NEEDED FOR PAIN. (Patient not taking: Reported on 10/01/2021), Disp: 30 tablet, Rfl: 0   STUDY - ASPIRE - aspirin 81 mg or placebo tablet (PI-Sethi), Take by mouth. (Patient not taking: Reported on 10/01/2021), Disp: , Rfl:   Allergies  Allergen Reactions   Iodine     REACTION: rash     ROS: Review of Systems A comprehensive review of systems was  negative except for: Behavioral/Psych: positive for anxiety, depression, and stress    Physical exam BP (!) 160/110   Pulse 92   Temp 98.3 F (36.8 C)   Ht 5\' 4"  (1.626 m)   Wt 223 lb 3.2 oz (101.2 kg)   LMP 06/01/2015   SpO2 94%   BMI 38.31 kg/m  General appearance: alert, cooperative, and stressed appearing Head: Normocephalic, without obvious abnormality, atraumatic Eyes: negative findings: lids and lashes normal, conjunctivae and sclerae normal, corneas clear, and pupils equal, round, reactive to light and accomodation Ears: normal TM's and external ear canals both ears Nose: Nares normal. Septum midline. Mucosa normal. No drainage or sinus tenderness. Throat:  Oropharynx without masses.  Moist mucous membranes Neck: no adenopathy,  supple, symmetrical, trachea midline, and thyroid not enlarged, symmetric, no tenderness/mass/nodules Back: symmetric, no curvature. ROM normal. No CVA tenderness. Lungs: clear to auscultation bilaterally Heart: regular rate and rhythm, S1, S2 normal, no murmur, click, rub or gallop Abdomen: soft, non-tender; bowel sounds normal; no masses,  no organomegaly Extremities: extremities normal, atraumatic, no cyanosis or edema Pulses: 2+ and symmetric Skin: Skin color, texture, turgor normal. No rashes or lesions Lymph nodes: Cervical, supraclavicular, and axillary nodes normal. Neurologic: Grossly normal Psych: stressed appearing  Depression screen [facility] 2/9 10/01/2021 01/25/2021 10/16/2020  Decreased Interest 1 1 0  Down, Depressed, Hopeless 1 1 1   PHQ - 2 Score 2 2 1   Altered sleeping 2 3 0  Tired, decreased energy 2 3 1   Change in appetite 1 1 2   Feeling bad or failure about yourself  1 1 1   Trouble concentrating 1 0 0  Moving slowly or fidgety/restless 0 0 0  Suicidal thoughts 0 0 0  PHQ-9 Score 9 10 5   Difficult doing work/chores Somewhat difficult Not difficult at all -  Some recent data might be hidden   GAD 7 : Generalized Anxiety Score  10/01/2021 01/25/2021 04/15/2020 08/27/2019  Nervous, Anxious, on Edge 1 1 1 3   Control/stop worrying 1 3 1 3   Worry too much - different things - 3 1 2   Trouble relaxing - 3 1 2   Restless 0 0 1 1  Easily annoyed or irritable 1 1 1 1   Afraid - awful might happen 0 0 0 1  Total GAD 7 Score - 11 6 13   Anxiety Difficulty Somewhat difficult Not difficult at all Not difficult at all Somewhat difficult    Assessment/ Plan: Patricia Carr here for annual physical exam.   Annual physical exam  Vitamin B 12 deficiency - Plan: Vitamin B12, cyanocobalamin (,VITAMIN B-12,) 1000 MCG/ML injection, cyanocobalamin ((VITAMIN B-12)) injection 1,000 mcg  S/P gastric bypass - Plan: Vitamin B12  Need for pneumococcal vaccination - Plan: Pneumococcal conjugate vaccine 20-valent (Prevnar 20)  Malignant neoplasm of upper-inner quadrant of left breast in female, estrogen receptor positive ([place])  Essential hypertension  Known B12 deficiency with history of gastric bypass surgery.  B12 level obtained prior to administration of intramuscular B12 in office.  B12 prescription renewed and sent to pharmacy.  Pneumococcal vaccination administered given known history of cancer and current treatment.  Continue current management as outlined by her oncologist.  Blood pressure not at goal but patient has not taken any of her medicines today.  Recommend resuming medications and have him back for blood pressure check with nurse.  Goal blood pressure less than 140/90.  If not at goal, can plan to advance the lisinopril portion of her Zestoretic to 40 mg  Counseled on healthy lifestyle choices, including diet (rich in fruits, vegetables and lean meats and low in salt and simple carbohydrates) and exercise (at least 30 minutes of moderate physical activity daily).  Patient to follow up in 1 year for annual exam or sooner if needed.  Trevor Wilkie M. Lajuana Ripple, DO

## 2021-10-01 NOTE — Patient Instructions (Signed)
You had labs performed today.  You will be contacted with the results of the labs once they are available, usually in the next 3 business days for routine lab work.  If you have an active my chart account, they will be released to your MyChart.  If you prefer to have these labs released to you via telephone, please let us know.  If you had a pap smear or biopsy performed, expect to be contacted in about 7-10 days.  Preventive Care 22-63 Years Old, Female Preventive care refers to lifestyle choices and visits with your health care provider that can promote health and wellness. Preventive care visits are also called wellness exams. What can I expect for my preventive care visit? Counseling Your health care provider may ask you questions about your: Medical history, including: Past medical problems. Family medical history. Pregnancy history. Current health, including: Menstrual cycle. Method of birth control. Emotional well-being. Home life and relationship well-being. Sexual activity and sexual health. Lifestyle, including: Alcohol, nicotine or tobacco, and drug use. Access to firearms. Diet, exercise, and sleep habits. Work and work Statistician. Sunscreen use. Safety issues such as seatbelt and bike helmet use. Physical exam Your health care provider will check your: Height and weight. These may be used to calculate your BMI (body mass index). BMI is a measurement that tells if you are at a healthy weight. Waist circumference. This measures the distance around your waistline. This measurement also tells if you are at a healthy weight and may help predict your risk of certain diseases, such as type 2 diabetes and high blood pressure. Heart rate and blood pressure. Body temperature. Skin for abnormal spots. What immunizations do I need? Vaccines are usually given at various ages, according to a schedule. Your health care provider will recommend vaccines for you based on your age,  medical history, and lifestyle or other factors, such as travel or where you work. What tests do I need? Screening Your health care provider may recommend screening tests for certain conditions. This may include: Lipid and cholesterol levels. Diabetes screening. This is done by checking your blood sugar (glucose) after you have not eaten for a while (fasting). Pelvic exam and Pap test. Hepatitis B test. Hepatitis C test. HIV (human immunodeficiency virus) test. STI (sexually transmitted infection) testing, if you are at risk. Lung cancer screening. Colorectal cancer screening. Mammogram. Talk with your health care provider about when you should start having regular mammograms. This may depend on whether you have a family history of breast cancer. BRCA-related cancer screening. This may be done if you have a family history of breast, ovarian, tubal, or peritoneal cancers. Bone density scan. This is done to screen for osteoporosis. Talk with your health care provider about your test results, treatment options, and if necessary, the need for more tests. Follow these instructions at home: Eating and drinking  Eat a diet that includes fresh fruits and vegetables, whole grains, lean protein, and low-fat dairy products. Take vitamin and mineral supplements as recommended by your health care provider. Do not drink alcohol if: Your health care provider tells you not to drink. You are pregnant, may be pregnant, or are planning to become pregnant. If you drink alcohol: Limit how much you have to 0-1 drink a day. Know how much alcohol is in your drink. In the [place] one drink equals one 12 oz bottle of beer (355 mL), one 5 oz glass of wine (148 mL), or one 1 oz glass of hard liquor (44  mL). Lifestyle Brush your teeth every morning and night with fluoride toothpaste. Floss one time each day. Exercise for at least 30 minutes 5 or more days each week. Do not use any products that contain nicotine or  tobacco. These products include cigarettes, chewing tobacco, and vaping devices, such as e-cigarettes. If you need help quitting, ask your health care provider. Do not use drugs. If you are sexually active, practice safe sex. Use a condom or other form of protection to prevent STIs. If you do not wish to become pregnant, use a form of birth control. If you plan to become pregnant, see your health care provider for a prepregnancy visit. Take aspirin only as told by your health care provider. Make sure that you understand how much to take and what form to take. Work with your health care provider to find out whether it is safe and beneficial for you to take aspirin daily. Find healthy ways to manage stress, such as: Meditation, yoga, or listening to music. Journaling. Talking to a trusted person. Spending time with friends and family. Minimize exposure to UV radiation to reduce your risk of skin cancer. Safety Always wear your seat belt while driving or riding in a vehicle. Do not drive: If you have been drinking alcohol. Do not ride with someone who has been drinking. When you are tired or distracted. While texting. If you have been using any mind-altering substances or drugs. Wear a helmet and other protective equipment during sports activities. If you have firearms in your house, make sure you follow all gun safety procedures. Seek help if you have been physically or sexually abused. What's next? Visit your health care provider once a year for an annual wellness visit. Ask your health care provider how often you should have your eyes and teeth checked. Stay up to date on all vaccines. This information is not intended to replace advice given to you by your health care provider. Make sure you discuss any questions you have with your health care provider. Document Revised: 04/14/2021 Document Reviewed: 04/14/2021 Elsevier Patient Education  [place].

## 2021-10-03 ENCOUNTER — Other Ambulatory Visit: Payer: Self-pay | Admitting: Family Medicine

## 2021-10-03 DIAGNOSIS — E782 Mixed hyperlipidemia: Secondary | ICD-10-CM

## 2021-10-04 ENCOUNTER — Encounter: Payer: Self-pay | Admitting: Family Medicine

## 2021-10-30 ENCOUNTER — Other Ambulatory Visit: Payer: Self-pay | Admitting: Family Medicine

## 2021-10-30 DIAGNOSIS — E559 Vitamin D deficiency, unspecified: Secondary | ICD-10-CM

## 2021-10-30 DIAGNOSIS — I10 Essential (primary) hypertension: Secondary | ICD-10-CM

## 2021-12-30 ENCOUNTER — Emergency Department (INDEPENDENT_AMBULATORY_CARE_PROVIDER_SITE_OTHER): Payer: Managed Care, Other (non HMO)

## 2021-12-30 ENCOUNTER — Encounter: Payer: Self-pay | Admitting: Family Medicine

## 2021-12-30 ENCOUNTER — Other Ambulatory Visit: Payer: Self-pay

## 2021-12-30 ENCOUNTER — Emergency Department: Admit: 2021-12-30 | Discharge status: Absent without leave | Payer: Self-pay

## 2021-12-30 ENCOUNTER — Emergency Department (INDEPENDENT_AMBULATORY_CARE_PROVIDER_SITE_OTHER)
Admission: EM | Admit: 2021-12-30 | Discharge: 2021-12-30 | Disposition: A | Discharge status: Home / Self Care | Payer: Managed Care, Other (non HMO) | Source: Home / Self Care

## 2021-12-30 DIAGNOSIS — J309 Allergic rhinitis, unspecified: Secondary | ICD-10-CM | POA: Diagnosis not present

## 2021-12-30 DIAGNOSIS — R509 Fever, unspecified: Secondary | ICD-10-CM

## 2021-12-30 DIAGNOSIS — J0100 Acute maxillary sinusitis, unspecified: Secondary | ICD-10-CM | POA: Diagnosis not present

## 2021-12-30 DIAGNOSIS — R059 Cough, unspecified: Secondary | ICD-10-CM

## 2021-12-30 MED ORDER — FEXOFENADINE HCL 180 MG PO TABS: 180.0000 mg | ORAL_TABLET | Freq: Every day | ORAL | 0 refills | Status: DC

## 2021-12-30 MED ORDER — PREDNISONE 20 MG PO TABS: ORAL_TABLET | ORAL | 0 refills | Status: DC

## 2021-12-30 MED ORDER — BENZONATATE 200 MG PO CAPS: 200.0000 mg | ORAL_CAPSULE | Freq: Three times a day (TID) | ORAL | 0 refills | Status: AC | PRN

## 2021-12-30 MED ORDER — AMOXICILLIN-POT CLAVULANATE 875-125 MG PO TABS: 1.0000 | ORAL_TABLET | Freq: Two times a day (BID) | ORAL | 0 refills | Status: AC

## 2021-12-30 NOTE — ED Triage Notes (Signed)
Pt presents to Urgent Care with c/o nasal congestion, cough, and R ear pain x 4 days. Now states she is having sob. Reports intermittent low-grade fever and body aches also--multiple negative COVID tests (at home). Pt vaccinated against COVID and flu.  ?

## 2021-12-30 NOTE — Discharge Instructions (Addendum)
Advised/informed the patient that chest x-ray was clear with no acute cardiopulmonary process.  Advised patient to take medication as directed with food to completion.  Advised patient starting tomorrow Friday, 12/31/2021 to take Prednisone and Allegra with first dose of Augmentin for the next 5 of 10 days.  May use Allegra as needed afterwards for concurrent postnasal drainage/drip.  Advised patient may use Tessalon Perles daily or as needed for cough.  Encouraged patient to increase daily water intake while taking these medications. ?

## 2021-12-30 NOTE — ED Provider Notes (Signed)
Patricia Carr CARE    CSN: [id number] Arrival date & time: 12/30/21  1812      History   Chief Complaint Chief Complaint  Patient presents with   Nasal Congestion   Cough   Otalgia    HPI Patricia Carr is a 66 y.o. female.   HPI 66 year old female presents with shortness of breath, cough, nasal congestion, and a right ear pain for 4 days.  PMH significant for HTN, malignant neoplasm of upper-inner quadrant of left breast, and anemia.  Past Medical History:  Diagnosis Date   Anemia    Anxiety    Back pain    Bronchitis    Depression    GERD (gastroesophageal reflux disease)    Hypertension    Joint pain    Knee pain    Malignant neoplasm of upper-inner quadrant of left breast in female, estrogen receptor positive ([place]) 2021   s/p lumpectomy and 16 rounds radiation   Osteoarthritis    Sinus complaint    Sleep apnea    Squamous cell cancer of skin of nose 2021   Removed at [place], Primary Derm: Dr Kristian Covey easily    Vitamin B 12 deficiency    Wears glasses     Patient Active Problem List   Diagnosis Date Noted   History of SCC (squamous cell carcinoma) of skin 10/16/2020   Mixed hyperlipidemia 10/16/2020   Malignant neoplasm of upper-inner quadrant of left breast in female, estrogen receptor positive ([place]) 10/16/2020   Vitamin D deficiency 06/18/2020   Class 2 severe obesity with serious comorbidity and body mass index (BMI) of 39.0 to 39.9 in adult ([place]) 06/18/2020   B12 deficiency 09/01/2019   Anxiety 07/09/2019   Depression, recurrent ([place]) 05/01/2018   Attention deficit 05/01/2018   HTN (hypertension) 05/10/2011   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 Authorizing Provider  amoxicillin-clavulanate (AUGMENTIN) 875-125 MG tablet Take 1 tablet by mouth 2 (two) times daily for 10 days. 12/30/21 01/09/22 Yes Eliezer Lofts, FNP  benzonatate (TESSALON) 200 MG capsule Take 1 capsule (200 mg total) by mouth 3 (three) times daily as needed for up to 7 days for cough. 12/30/21 01/06/22 Yes Eliezer Lofts, FNP  fexofenadine [facility] ALLERGY) 180 MG tablet Take 1 tablet (180 mg total) by mouth daily for 15 days. 12/30/21 01/14/22 Yes Eliezer Lofts, FNP  predniSONE (DELTASONE) 20 MG tablet Take 3 tabs PO daily x 5 days. 12/30/21  Yes Eliezer Lofts, FNP  ARIPiprazole (ABILIFY) 2 MG  tablet Take 2 mg by mouth at bedtime. 01/23/20   [provider]  Armodafinil 250 MG tablet Take 250 mg by mouth daily. 03/21/20   [provider]  Biotin 1000 MCG tablet Take 1,000 mcg by mouth daily.    [provider]  clonazePAM (KLONOPIN) 0.5 MG tablet Take 0.5 mg by mouth 3 (three) times daily as needed for anxiety.    [provider]  cyanocobalamin (,VITAMIN B-12,) 1000 MCG/ML injection INJECT 1 ML (1,000 MCG TOTAL) INTO THE MUSCLE EVERY 30 DAYS. 10/01/21   Janora Norlander, DO  desvenlafaxine (PRISTIQ) 100 MG 24 hr tablet Take 100 mg by mouth daily.    [provider]  exemestane (AROMASIN) 25 MG tablet Take 25 mg by mouth daily after breakfast.    [provider]  lisinopril-hydrochlorothiazide (ZESTORETIC) 20-25 MG tablet TAKE 1 TABLET BY MOUTH EVERY DAY 04/20/21   Ronnie Doss M, DO  meloxicam (MOBIC) 15 MG tablet TAKE 1/2-1 TABLET (7.5-15 MG TOTAL) BY MOUTH DAILY AS NEEDED FOR PAIN. Patient not taking:  Reported on 10/01/2021 09/25/21   Janora Norlander, DO  metoprolol succinate (TOPROL-XL) 50 MG 24 hr tablet TAKE 2 TABLETS BY MOUTH AT BEDTIME. 11/02/21   Ronnie Doss M, DO  multivitamin-iron-minerals-folic acid (CENTRUM) chewable tablet Chew 1 tablet by mouth daily.    [provider]  rosuvastatin (CRESTOR) 5 MG tablet TAKE 1 TABLET (5 MG TOTAL) BY MOUTH DAILY. 10/04/21   Janora Norlander, DO  sertraline (ZOLOFT) 25 MG tablet Take 25 mg by mouth daily. 10/25/21   [provider]  STUDY - ASPIRE - aspirin 81 mg or placebo tablet (PI-Sethi) Take by mouth. Patient not taking: Reported on 10/01/2021    [provider]  Syringe/Needle, Disp, (SYRINGE 3CC/25GX1") 25G X 1" 3 ML MISC 1 Units by Does not apply route once a week. 08/28/19   Terald Sleeper, PA-C  Vitamin D, Ergocalciferol, (DRISDOL) 1.25 MG (50000 UNIT) CAPS capsule TAKE 1 CAPSULE (50,000 UNITS TOTAL) BY MOUTH EVERY 7 (SEVEN) DAYS 11/02/21   Janora Norlander, DO    Family History Family History  Problem Relation Age of Onset   Heart disease Father    Hypertension Father    Sudden death Father    Sleep apnea Father    Hyperlipidemia Mother    Colon cancer Mother    Diabetes Mother    Depression Mother    Anxiety disorder Mother    Hyperlipidemia Sister     Social History Social History   Tobacco Use   Smoking status: Never   Smokeless tobacco: Never  Vaping Use   Vaping Use: Never used  Substance Use Topics   Alcohol use: No   Drug use: Never     Allergies   Iodine and Ivp dye [iodinated contrast media]   Review of Systems Review of Systems  HENT:  Positive for congestion, ear pain and postnasal drip.   Respiratory:  Positive for cough and shortness of breath.   All other systems reviewed and are negative.   Physical Exam Triage Vital Signs ED Triage Vitals  Enc Vitals Group     BP      Pulse      Resp      Temp      Temp src      SpO2      Weight      Height       Head Circumference      Peak Flow  Pain Score      Pain Loc      Pain Edu?      Excl. in [place]?    No data found.  Updated Vital Signs BP (!) 163/85 (BP Location: Right Arm)    Pulse 79    Temp 98.2 F (36.8 C) (Oral)    Resp 20    Ht 5\' 3"  (1.6 m)    Wt 210 lb (95.3 kg)    LMP 06/01/2015    SpO2 94%    BMI 37.20 kg/m      Physical Exam Vitals and nursing note reviewed.  Constitutional:      General: She is not in acute distress.    Appearance: She is obese. She is ill-appearing.  HENT:     Head: Normocephalic and atraumatic.     Right Ear: Tympanic membrane and external ear normal.     Left Ear: Tympanic membrane and external ear normal.     Ears:     Comments: Moderate to significant eustachian tube dysfunction noted bilaterally    Nose:     Comments: Turbinates are erythematous/edematous    Mouth/Throat:     Mouth: Mucous membranes are moist.     Pharynx: Oropharynx is clear.     Comments: Moderate amount of clear drainage of posterior oropharynx noted Eyes:     Extraocular Movements: Extraocular movements intact.     Conjunctiva/sclera: Conjunctivae normal.     Pupils: Pupils are equal, round, and reactive to light.  Cardiovascular:     Rate and Rhythm: Normal rate and regular rhythm.     Pulses: Normal pulses.     Heart sounds: Normal heart sounds.  Pulmonary:     Effort: Pulmonary effort is normal.     Breath sounds: Wheezing and rhonchi present.     Comments: Diffuse scattered rhonchi throughout, expiratory wheezes, diminished breath sounds bibasilar, with infrequent nonproductive cough noted Musculoskeletal:     Cervical back: Normal range of motion and neck supple.  Skin:    General: Skin is warm and dry.  Neurological:     General: No focal deficit present.     Mental Status: She is alert and oriented to person, place, and time.     UC Treatments / Results  Labs (all labs ordered are listed, but only abnormal results are displayed) Labs Reviewed -  No data to display  EKG   Radiology DG Chest 2 View  Result Date: 12/30/2021 CLINICAL DATA:  Cough and fever 4 days EXAM: CHEST - 2 VIEW COMPARISON:  12/13/2010 FINDINGS: The heart size and mediastinal contours are within normal limits. Both lungs are clear. The visualized skeletal structures are unremarkable. IMPRESSION: No active cardiopulmonary disease. Electronically Signed   By: Franchot Gallo M.D.   On: 12/30/2021 18:57    Procedures Procedures (including critical care time)  Medications Ordered in UC Medications - No data to display  Initial Impression / Assessment and Plan / UC Course  I have reviewed the triage vital signs and the nursing notes.  Pertinent labs & imaging results that were available during my care of the patient were reviewed by me and considered in my medical decision making (see chart for details).     MDM: 1.  Acute maxillary sinusitis-Rx'd Augmentin; 2. Cough-CXR revealed no acute cardiopulmonary process, Rx'd Prednisone and Tessalon Perles; 3.  Allergic rhinitis-Rx'd Allegra. Advised/informed the patient that chest x-ray was clear with no acute cardiopulmonary process.  Advised patient to take medication as directed with  food to completion.  Advised patient starting tomorrow Friday, 12/31/2021 to take Prednisone and Allegra with first dose of Augmentin for the next 5 of 10 days.  May use Allegra as needed afterwards for concurrent postnasal drainage/drip.  Advised patient may use Tessalon Perles daily or as needed for cough.  Encouraged patient to increase daily water intake while taking these medications.  Patient discharged home, hemodynamically stable. Final Clinical Impressions(s) / UC Diagnoses   Final diagnoses:  Acute maxillary sinusitis, recurrence not specified  Cough, unspecified type  Allergic rhinitis, unspecified seasonality, unspecified trigger     Discharge Instructions      Advised/informed the patient that chest x-ray was clear with no  acute cardiopulmonary process.  Advised patient to take medication as directed with food to completion.  Advised patient starting tomorrow Friday, 12/31/2021 to take Prednisone and Allegra with first dose of Augmentin for the next 5 of 10 days.  May use Allegra as needed afterwards for concurrent postnasal drainage/drip.  Advised patient may use Tessalon Perles daily or as needed for cough.  Encouraged patient to increase daily water intake while taking these medications.     ED Prescriptions     Medication Sig Dispense Auth. Provider   amoxicillin-clavulanate (AUGMENTIN) 875-125 MG tablet Take 1 tablet by mouth 2 (two) times daily for 10 days. 20 tablet Eliezer Lofts, FNP   predniSONE (DELTASONE) 20 MG tablet Take 3 tabs PO daily x 5 days. 15 tablet Eliezer Lofts, FNP   fexofenadine [facility] ALLERGY) 180 MG tablet Take 1 tablet (180 mg total) by mouth daily for 15 days. 15 tablet Eliezer Lofts, FNP   benzonatate (TESSALON) 200 MG capsule Take 1 capsule (200 mg total) by mouth 3 (three) times daily as needed for up to 7 days for cough. 30 capsule Eliezer Lofts, FNP      PDMP not reviewed this encounter.   Eliezer Lofts, [place] 12/30/21 1933

## 2022-01-10 ENCOUNTER — Ambulatory Visit (INDEPENDENT_AMBULATORY_CARE_PROVIDER_SITE_OTHER): Payer: Managed Care, Other (non HMO) | Admitting: Family Medicine

## 2022-01-10 ENCOUNTER — Encounter: Payer: Self-pay | Admitting: Family Medicine

## 2022-01-10 VITALS — BP 139/82 | HR 93 | Temp 97.6°F | Ht 63.0 in | Wt 222.1 lb

## 2022-01-10 DIAGNOSIS — I10 Essential (primary) hypertension: Secondary | ICD-10-CM

## 2022-01-10 DIAGNOSIS — E538 Deficiency of other specified B group vitamins: Secondary | ICD-10-CM

## 2022-01-10 NOTE — Progress Notes (Signed)
? ?Subjective: ?CC: Follow-up hypertension, B12 deficiency ?PCP: Janora Norlander, DO ?Patricia Carr is a 66 y.o. female presenting to clinic today for: ? ?1.  Hypertension ?At last appointment BP was not at goal but she had not taken her medications for that morning.  She took her medicines today and things have been going fairly good from a stress standpoint.  Her mother will soon be living at [place].  She is requiring more and more supervision.  Her sister and she are healthcare power of attorney and will be meeting with the facility today to get her enrolled ? ?2.  B12 deficiency ?Patient takes monthly B12 injections for known B12 deficiency in the setting of previous gastric bypass surgery.  She forgot to get this lab done back in December but is willing to get it done today.  Last injection was 1 month ago.  She self injects and needs no refills at this time ? ? ?ROS: Per HPI ? ?Allergies  ?Allergen Reactions  ? Iodine   ?  REACTION: rash  ? Ivp Dye [Iodinated Contrast Media]   ? ?Past Medical History:  ?Diagnosis Date  ? Anemia   ? Anxiety   ? Back pain   ? Bronchitis   ? Depression   ? GERD (gastroesophageal reflux disease)   ? Hypertension   ? Joint pain   ? Knee pain   ? Malignant neoplasm of upper-inner quadrant of left breast in female, estrogen receptor positive ([place]) 2021  ? s/p lumpectomy and 16 rounds radiation  ? Osteoarthritis   ? Sinus complaint   ? Sleep apnea   ? Squamous cell cancer of skin of nose 2021  ? Removed at [place], Primary Derm: Dr Nevada Crane  ? Tires easily   ? Vitamin B 12 deficiency   ? Wears glasses   ? ? ?Current Outpatient Medications:  ?  ARIPiprazole (ABILIFY) 2 MG tablet, Take 2 mg by mouth at bedtime., Disp: , Rfl:  ?  Armodafinil 250 MG tablet, Take 250 mg by mouth daily., Disp: , Rfl:  ?  Biotin 1000 MCG tablet, Take 1,000 mcg by mouth daily., Disp: , Rfl:  ?  clonazePAM (KLONOPIN) 0.5 MG tablet, Take 0.5 mg by mouth 3 (three) times daily as needed for  anxiety., Disp: , Rfl:  ?  cyanocobalamin (,VITAMIN B-12,) 1000 MCG/ML injection, INJECT 1 ML (1,000 MCG TOTAL) INTO THE MUSCLE EVERY 30 DAYS., Disp: 1 mL, Rfl: 12 ?  desvenlafaxine (PRISTIQ) 100 MG 24 hr tablet, Take 100 mg by mouth daily., Disp: , Rfl:  ?  exemestane (AROMASIN) 25 MG tablet, Take 25 mg by mouth daily after breakfast., Disp: , Rfl:  ?  fexofenadine (ALLEGRA ALLERGY) 180 MG tablet, Take 1 tablet (180 mg total) by mouth daily for 15 days., Disp: 15 tablet, Rfl: 0 ?  lisinopril-hydrochlorothiazide (ZESTORETIC) 20-25 MG tablet, TAKE 1 TABLET BY MOUTH EVERY DAY, Disp: 90 tablet, Rfl: 3 ?  metoprolol succinate (TOPROL-XL) 50 MG 24 hr tablet, TAKE 2 TABLETS BY MOUTH AT BEDTIME., Disp: 180 tablet, Rfl: 0 ?  multivitamin-iron-minerals-folic acid (CENTRUM) chewable tablet, Chew 1 tablet by mouth daily., Disp: , Rfl:  ?  rosuvastatin (CRESTOR) 5 MG tablet, TAKE 1 TABLET (5 MG TOTAL) BY MOUTH DAILY., Disp: 90 tablet, Rfl: 1 ?  sertraline (ZOLOFT) 25 MG tablet, Take 25 mg by mouth daily., Disp: , Rfl:  ?  Syringe/Needle, Disp, (SYRINGE 3CC/25GX1") 25G X 1" 3 ML MISC, 1 Units by Does not apply route  once a week., Disp: 4 each, Rfl: 12 ?  Vitamin D, Ergocalciferol, (DRISDOL) 1.25 MG (50000 UNIT) CAPS capsule, TAKE 1 CAPSULE (50,000 UNITS TOTAL) BY MOUTH EVERY 7 (SEVEN) DAYS, Disp: 12 capsule, Rfl: 0 ?Social History  ? ?Socioeconomic History  ? Marital status: Married  ?  Spouse name: [name]  ? Number of children: Not on file  ? Years of education: Not on file  ? Highest education level: Not on file  ?Occupational History  ? Occupation: Research officer, political party  ?Tobacco Use  ? Smoking status: Never  ? Smokeless tobacco: Never  ?Vaping Use  ? Vaping Use: Never used  ?Substance and Sexual Activity  ? Alcohol use: No  ? Drug use: Never  ? Sexual activity: Yes  ?Other Topics Concern  ? Not on file  ?Social History Narrative  ? Not on file  ? ?Social Determinants of Health  ? ?Financial Resource Strain: Not on file   ?Food Insecurity: Not on file  ?Transportation Needs: Not on file  ?Physical Activity: Not on file  ?Stress: Not on file  ?Social Connections: Not on file  ?Intimate Partner Violence: Not on file  ? ?Family History  ?Problem Relation Age of Onset  ? Heart disease Father   ? Hypertension Father   ? Sudden death Father   ? Sleep apnea Father   ? Hyperlipidemia Mother   ? Colon cancer Mother   ? Diabetes Mother   ? Depression Mother   ? Anxiety disorder Mother   ? Hyperlipidemia Sister   ? ? ?Objective: ?Office vital signs reviewed. ?BP 139/82   Pulse 93   Temp 97.6 ?F (36.4 ?C) (Temporal)   Ht '5\' 3"'$  (1.6 m)   Wt 222 lb 2 oz (100.8 kg)   LMP 06/01/2015   BMI 39.35 kg/m?  ? ?Physical Examination:  ?General: Awake, alert, well nourished, No acute distress ?HEENT: Sclera white.  Moist mucous membranes ?Cardio: regular rate and rhythm, S1S2 heard, no murmurs appreciated ?Pulm: clear to auscultation bilaterally, no wheezes, rhonchi or rales; normal work of breathing on room air ?MSK: Ambulating independently with normal station ? ?Assessment/ Plan: ?66 y.o. female  ? ?Essential hypertension ? ?Vitamin B 12 deficiency - Plan: Vitamin B12 ? ?Blood pressure back in normal range.  No changes. ? ?Check B12 level given history of gastric bypass and B12 deficiency.  She is currently treated with B12 monthly with last injection about 1 month ago. ? ?No orders of the defined types were placed in this encounter. ? ?No orders of the defined types were placed in this encounter. ? ? ? ?Janora Norlander, DO ?[place] ?[phone] ? ?

## 2022-01-11 LAB — VITAMIN B12: Vitamin B-12: 517 pg/mL (ref 232–1245)

## 2022-02-23 ENCOUNTER — Other Ambulatory Visit: Payer: Self-pay | Admitting: Family Medicine

## 2022-02-23 DIAGNOSIS — E559 Vitamin D deficiency, unspecified: Secondary | ICD-10-CM

## 2022-03-17 ENCOUNTER — Encounter: Payer: Self-pay | Admitting: Family Medicine

## 2022-03-18 ENCOUNTER — Encounter: Payer: Self-pay | Admitting: Family Medicine

## 2022-03-28 ENCOUNTER — Other Ambulatory Visit: Payer: Self-pay | Admitting: Family Medicine

## 2022-03-28 DIAGNOSIS — E782 Mixed hyperlipidemia: Secondary | ICD-10-CM

## 2022-04-14 ENCOUNTER — Telehealth: Payer: Self-pay | Admitting: Family Medicine

## 2022-04-14 NOTE — Telephone Encounter (Signed)
Called and scheduled

## 2022-04-26 ENCOUNTER — Ambulatory Visit (INDEPENDENT_AMBULATORY_CARE_PROVIDER_SITE_OTHER): Payer: Managed Care, Other (non HMO) | Admitting: Family Medicine

## 2022-04-26 VITALS — BP 131/75 | HR 70 | Temp 98.3°F | Ht 63.0 in | Wt 224.0 lb

## 2022-04-26 DIAGNOSIS — Z6839 Body mass index (BMI) 39.0-39.9, adult: Secondary | ICD-10-CM

## 2022-04-26 DIAGNOSIS — I10 Essential (primary) hypertension: Secondary | ICD-10-CM | POA: Diagnosis not present

## 2022-04-26 DIAGNOSIS — E782 Mixed hyperlipidemia: Secondary | ICD-10-CM

## 2022-04-26 DIAGNOSIS — Z9884 Bariatric surgery status: Secondary | ICD-10-CM

## 2022-04-26 DIAGNOSIS — E66812 Obesity, class 2: Secondary | ICD-10-CM

## 2022-04-26 MED ORDER — NONFORMULARY OR COMPOUNDED ITEM: 4 refills | Status: DC

## 2022-04-26 MED ORDER — NONFORMULARY OR COMPOUNDED ITEM: 4 refills | Status: AC

## 2022-05-05 ENCOUNTER — Encounter: Payer: Self-pay | Admitting: Family Medicine

## 2022-05-05 MED ORDER — LISINOPRIL-HYDROCHLOROTHIAZIDE 20-25 MG PO TABS: 1.0000 | ORAL_TABLET | Freq: Every day | ORAL | 0 refills | Status: DC

## 2022-05-10 ENCOUNTER — Ambulatory Visit: Payer: Managed Care, Other (non HMO) | Admitting: Family Medicine

## 2022-06-08 ENCOUNTER — Encounter (INDEPENDENT_AMBULATORY_CARE_PROVIDER_SITE_OTHER): Payer: Self-pay

## 2022-06-22 ENCOUNTER — Encounter: Payer: Self-pay | Admitting: Family Medicine

## 2022-06-26 ENCOUNTER — Other Ambulatory Visit: Payer: Self-pay | Admitting: Family Medicine

## 2022-06-26 DIAGNOSIS — E782 Mixed hyperlipidemia: Secondary | ICD-10-CM

## 2022-07-12 ENCOUNTER — Encounter: Payer: Self-pay | Admitting: Family Medicine

## 2022-07-12 ENCOUNTER — Ambulatory Visit (INDEPENDENT_AMBULATORY_CARE_PROVIDER_SITE_OTHER): Payer: Managed Care, Other (non HMO) | Admitting: Family Medicine

## 2022-07-12 VITALS — BP 151/77 | HR 62 | Temp 97.8°F | Ht 63.0 in | Wt 224.8 lb

## 2022-07-12 DIAGNOSIS — Z9884 Bariatric surgery status: Secondary | ICD-10-CM | POA: Diagnosis not present

## 2022-07-12 DIAGNOSIS — I10 Essential (primary) hypertension: Secondary | ICD-10-CM | POA: Diagnosis not present

## 2022-07-12 DIAGNOSIS — E782 Mixed hyperlipidemia: Secondary | ICD-10-CM | POA: Diagnosis not present

## 2022-07-12 DIAGNOSIS — Z6839 Body mass index (BMI) 39.0-39.9, adult: Secondary | ICD-10-CM

## 2022-07-12 DIAGNOSIS — F411 Generalized anxiety disorder: Secondary | ICD-10-CM

## 2022-07-12 DIAGNOSIS — F339 Major depressive disorder, recurrent, unspecified: Secondary | ICD-10-CM

## 2022-07-12 NOTE — Progress Notes (Unsigned)
Subjective: CC:*** PCP: Janora Norlander, DO Patricia Carr is a 66 y.o. female presenting to clinic today for:  1. ***   ROS: Per HPI  Allergies  Allergen Reactions   Iodine     REACTION: rash   Ivp Dye [Iodinated Contrast Media]    Past Medical History:  Diagnosis Date   Anemia    Anxiety    Back pain    Bronchitis    Depression    GERD (gastroesophageal reflux disease)    Hypertension    Joint pain    Knee pain    Malignant neoplasm of upper-inner quadrant of left breast in female, estrogen receptor positive ([place]) 2021   s/p lumpectomy and 16 rounds radiation   Osteoarthritis    Sinus complaint    Sleep apnea    Squamous cell cancer of skin of nose 2021   Removed at [place], Primary Derm: Dr Kristian Covey easily    Vitamin B 12 deficiency    Wears glasses     Current Outpatient Medications:    ARIPiprazole (ABILIFY) 2 MG tablet, Take 2 mg by mouth at bedtime., Disp: , Rfl:    Armodafinil 250 MG tablet, Take 250 mg by mouth daily., Disp: , Rfl:    Biotin 1000 MCG tablet, Take 1,000 mcg by mouth daily., Disp: , Rfl:    clonazePAM (KLONOPIN) 0.5 MG tablet, Take 0.5 mg by mouth 3 (three) times daily as needed for anxiety., Disp: , Rfl:    cyanocobalamin (,VITAMIN B-12,) 1000 MCG/ML injection, INJECT 1 ML (1,000 MCG TOTAL) INTO THE MUSCLE EVERY 30 DAYS., Disp: 1 mL, Rfl: 12   desvenlafaxine (PRISTIQ) 100 MG 24 hr tablet, Take 100 mg by mouth daily., Disp: , Rfl:    exemestane (AROMASIN) 25 MG tablet, Take 25 mg by mouth daily after breakfast., Disp: , Rfl:    fexofenadine (ALLEGRA ALLERGY) 180 MG tablet, Take 1 tablet (180 mg total) by mouth daily for 15 days., Disp: 15 tablet, Rfl: 0   lisinopril-hydrochlorothiazide (ZESTORETIC) 20-25 MG tablet, Take 1 tablet by mouth daily., Disp: 90 tablet, Rfl: 0   metoprolol succinate (TOPROL-XL) 50 MG 24 hr tablet, TAKE 2 TABLETS BY MOUTH AT BEDTIME., Disp: 180 tablet, Rfl: 0   multivitamin-iron-minerals-folic acid  (CENTRUM) chewable tablet, Chew 1 tablet by mouth daily., Disp: , Rfl:    NONFORMULARY OR COMPOUNDED ITEM, Semaglutide w/ b6 2.'5mg'$ /mL. Inject 10 units every 7 days x4 weeks, then 20 u every 7 d x4 weeks, then 40 u every 7 days x4 weeks, then 80 u every 7 days. Dispense QS for 1 month w/ 4 RFs, Disp: 2 each, Rfl: 4   rosuvastatin (CRESTOR) 5 MG tablet, TAKE 1 TABLET (5 MG TOTAL) BY MOUTH DAILY., Disp: 90 tablet, Rfl: 0   sertraline (ZOLOFT) 25 MG tablet, Take 25 mg by mouth daily., Disp: , Rfl:    Syringe/Needle, Disp, (SYRINGE 3CC/25GX1") 25G X 1" 3 ML MISC, 1 Units by Does not apply route once a week., Disp: 4 each, Rfl: 12   Vitamin D, Ergocalciferol, (DRISDOL) 1.25 MG (50000 UNIT) CAPS capsule, TAKE 1 CAPSULE (50,000 UNITS TOTAL) BY MOUTH EVERY 7 (SEVEN) DAYS, Disp: 12 capsule, Rfl: 3 Social History   Socioeconomic History   Marital status: Married    Spouse name: Jimmy   Number of children: Not on file   Years of education: Not on file   Highest education level: Not on file  Occupational History   Occupation: Youth worker  Operations  Tobacco Use   Smoking status: Never   Smokeless tobacco: Never  Vaping Use   Vaping Use: Never used  Substance and Sexual Activity   Alcohol use: No   Drug use: Never   Sexual activity: Yes  Other Topics Concern   Not on file  Social History Narrative   Not on file   Social Determinants of Health   Financial Resource Strain: Not on file  Food Insecurity: Not on file  Transportation Needs: Not on file  Physical Activity: Not on file  Stress: Not on file  Social Connections: Not on file  Intimate Partner Violence: Not on file   Family History  Problem Relation Age of Onset   Heart disease Father    Hypertension Father    Sudden death Father    Sleep apnea Father    Hyperlipidemia Mother    Colon cancer Mother    Diabetes Mother    Depression Mother    Anxiety disorder Mother    Hyperlipidemia Sister     Objective: Office vital  signs reviewed. LMP 06/01/2015   Physical Examination:  General: Awake, alert, *** nourished, No acute distress HEENT: Normal    Neck: No masses palpated. No lymphadenopathy    Ears: Tympanic membranes intact, normal light reflex, no erythema, no bulging    Eyes: PERRLA, extraocular membranes intact, sclera ***    Nose: nasal turbinates moist, *** nasal discharge    Throat: moist mucus membranes, no erythema, *** tonsillar exudate.  Airway is patent Cardio: regular rate and rhythm, S1S2 heard, no murmurs appreciated Pulm: clear to auscultation bilaterally, no wheezes, rhonchi or rales; normal work of breathing on room air GI: soft, non-tender, non-distended, bowel sounds present x4, no hepatomegaly, no splenomegaly, no masses GU: external vaginal tissue ***, cervix ***, *** punctate lesions on cervix appreciated, *** discharge from cervical os, *** bleeding, *** cervical motion tenderness, *** abdominal/ adnexal masses Extremities: warm, well perfused, No edema, cyanosis or clubbing; +*** pulses bilaterally MSK: *** gait and *** station Skin: dry; intact; no rashes or lesions Neuro: *** Strength and light touch sensation grossly intact, *** DTRs ***/4  Assessment/ Plan: 66 y.o. female   ***  No orders of the defined types were placed in this encounter.  No orders of the defined types were placed in this encounter.    Janora Norlander, DO [place] [phone]

## 2022-08-01 ENCOUNTER — Encounter: Payer: Self-pay | Admitting: Family Medicine

## 2022-08-17 ENCOUNTER — Encounter: Payer: Self-pay | Admitting: Family Medicine

## 2022-08-21 ENCOUNTER — Other Ambulatory Visit: Payer: Self-pay | Admitting: Family Medicine

## 2022-08-21 DIAGNOSIS — I10 Essential (primary) hypertension: Secondary | ICD-10-CM

## 2022-08-22 MED ORDER — METOPROLOL SUCCINATE ER 50 MG PO TB24: 100.0000 mg | ORAL_TABLET | Freq: Every day | ORAL | 0 refills | Status: DC

## 2022-09-01 ENCOUNTER — Ambulatory Visit (INDEPENDENT_AMBULATORY_CARE_PROVIDER_SITE_OTHER): Payer: 59 | Admitting: Psychiatry

## 2022-09-01 ENCOUNTER — Encounter (HOSPITAL_COMMUNITY): Payer: Self-pay | Admitting: Psychiatry

## 2022-09-01 DIAGNOSIS — F411 Generalized anxiety disorder: Secondary | ICD-10-CM | POA: Diagnosis not present

## 2022-09-01 DIAGNOSIS — F502 Bulimia nervosa: Secondary | ICD-10-CM | POA: Diagnosis not present

## 2022-09-01 DIAGNOSIS — G4700 Insomnia, unspecified: Secondary | ICD-10-CM | POA: Insufficient documentation

## 2022-09-01 DIAGNOSIS — F331 Major depressive disorder, recurrent, moderate: Secondary | ICD-10-CM

## 2022-09-01 DIAGNOSIS — G4733 Obstructive sleep apnea (adult) (pediatric): Secondary | ICD-10-CM

## 2022-09-01 DIAGNOSIS — M1990 Unspecified osteoarthritis, unspecified site: Secondary | ICD-10-CM | POA: Insufficient documentation

## 2022-09-01 DIAGNOSIS — F5104 Psychophysiologic insomnia: Secondary | ICD-10-CM | POA: Diagnosis not present

## 2022-09-01 DIAGNOSIS — Z8659 Personal history of other mental and behavioral disorders: Secondary | ICD-10-CM | POA: Insufficient documentation

## 2022-09-01 DIAGNOSIS — M159 Polyosteoarthritis, unspecified: Secondary | ICD-10-CM

## 2022-09-01 DIAGNOSIS — F410 Panic disorder [episodic paroxysmal anxiety] without agoraphobia: Secondary | ICD-10-CM

## 2022-09-01 MED ORDER — DULOXETINE HCL 30 MG PO CPEP: ORAL_CAPSULE | ORAL | 2 refills | Status: DC

## 2022-09-01 NOTE — Patient Instructions (Signed)
We made several changes to your medication today. We discontinued your klonopin since you haven't been using it much lately and due to risk of dementia. We discontinued your zoloft due to potential drug-drug interaction with your other antidepressants. We discontinued your pristiq due to cost and will switch it out for duloxetine which you will start taking 1 tablet daily ('30mg'$ ) for 6 days then increase to 2 tablets daily for '60mg'$  after that. We will follow up in 2 weeks. Dr. Nehemiah Settle also placed a referral for psychotherapy for you through our office.
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
## 2022-09-02 ENCOUNTER — Other Ambulatory Visit: Payer: Self-pay | Admitting: Family Medicine

## 2022-09-02 DIAGNOSIS — R632 Polyphagia: Secondary | ICD-10-CM

## 2022-09-02 DIAGNOSIS — Z9884 Bariatric surgery status: Secondary | ICD-10-CM

## 2022-09-05 NOTE — Telephone Encounter (Signed)
Please call her specialty pharmacy in [place] with verbal ok to 80u every 7 days of her compounded med.  Give #[phone] supply with 4 RFs.

## 2022-09-05 NOTE — Progress Notes (Signed)
[place]

## 2022-09-08 ENCOUNTER — Other Ambulatory Visit (HOSPITAL_COMMUNITY): Payer: Self-pay | Admitting: Psychiatry

## 2022-09-08 DIAGNOSIS — M159 Polyosteoarthritis, unspecified: Secondary | ICD-10-CM

## 2022-09-08 DIAGNOSIS — F331 Major depressive disorder, recurrent, moderate: Secondary | ICD-10-CM

## 2022-09-08 DIAGNOSIS — F410 Panic disorder [episodic paroxysmal anxiety] without agoraphobia: Secondary | ICD-10-CM

## 2022-09-08 DIAGNOSIS — M150 Primary generalized (osteo)arthritis: Secondary | ICD-10-CM

## 2022-09-12 ENCOUNTER — Other Ambulatory Visit: Payer: Self-pay | Admitting: Family Medicine

## 2022-09-12 ENCOUNTER — Encounter (HOSPITAL_COMMUNITY): Payer: Self-pay

## 2022-09-12 ENCOUNTER — Other Ambulatory Visit (HOSPITAL_COMMUNITY): Payer: Self-pay | Admitting: Psychiatry

## 2022-09-12 DIAGNOSIS — E782 Mixed hyperlipidemia: Secondary | ICD-10-CM

## 2022-09-12 DIAGNOSIS — F410 Panic disorder [episodic paroxysmal anxiety] without agoraphobia: Secondary | ICD-10-CM

## 2022-09-12 DIAGNOSIS — F5104 Psychophysiologic insomnia: Secondary | ICD-10-CM

## 2022-09-12 DIAGNOSIS — F331 Major depressive disorder, recurrent, moderate: Secondary | ICD-10-CM

## 2022-09-12 MED ORDER — ROSUVASTATIN CALCIUM 5 MG PO TABS: 5.0000 mg | ORAL_TABLET | Freq: Every day | ORAL | 0 refills | Status: DC

## 2022-09-12 MED ORDER — ARIPIPRAZOLE 2 MG PO TABS: 2.0000 mg | ORAL_TABLET | Freq: Every day | ORAL | 2 refills | Status: DC

## 2022-09-15 ENCOUNTER — Telehealth (INDEPENDENT_AMBULATORY_CARE_PROVIDER_SITE_OTHER): Payer: 59 | Admitting: Psychiatry

## 2022-09-15 ENCOUNTER — Encounter (HOSPITAL_COMMUNITY): Payer: Self-pay | Admitting: Psychiatry

## 2022-09-15 DIAGNOSIS — Z9884 Bariatric surgery status: Secondary | ICD-10-CM

## 2022-09-15 DIAGNOSIS — F502 Bulimia nervosa: Secondary | ICD-10-CM

## 2022-09-15 DIAGNOSIS — F5104 Psychophysiologic insomnia: Secondary | ICD-10-CM

## 2022-09-15 DIAGNOSIS — M159 Polyosteoarthritis, unspecified: Secondary | ICD-10-CM

## 2022-09-15 DIAGNOSIS — F331 Major depressive disorder, recurrent, moderate: Secondary | ICD-10-CM | POA: Diagnosis not present

## 2022-09-15 DIAGNOSIS — F410 Panic disorder [episodic paroxysmal anxiety] without agoraphobia: Secondary | ICD-10-CM

## 2022-09-15 DIAGNOSIS — F411 Generalized anxiety disorder: Secondary | ICD-10-CM | POA: Diagnosis not present

## 2022-09-15 DIAGNOSIS — G4733 Obstructive sleep apnea (adult) (pediatric): Secondary | ICD-10-CM

## 2022-09-15 DIAGNOSIS — E538 Deficiency of other specified B group vitamins: Secondary | ICD-10-CM

## 2022-09-15 DIAGNOSIS — Z6839 Body mass index (BMI) 39.0-39.9, adult: Secondary | ICD-10-CM

## 2022-09-15 DIAGNOSIS — E559 Vitamin D deficiency, unspecified: Secondary | ICD-10-CM

## 2022-09-15 MED ORDER — DULOXETINE HCL 60 MG PO CPEP: 60.0000 mg | ORAL_CAPSULE | Freq: Every day | ORAL | 1 refills | Status: DC

## 2022-09-15 NOTE — Patient Instructions (Addendum)
We didn't make any medication changes today but may discontinue abilify next month. Take a look at this website for an alternative way of thinking about nutrition and body size: SendThoughts.nl  Also, we identified some cognitive distortions today, check out this list to think further on [email or web address]
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
## 2022-09-27 ENCOUNTER — Encounter (INDEPENDENT_AMBULATORY_CARE_PROVIDER_SITE_OTHER): Payer: Managed Care, Other (non HMO) | Admitting: Family Medicine

## 2022-09-27 DIAGNOSIS — U071 COVID-19: Secondary | ICD-10-CM

## 2022-09-27 MED ORDER — MOLNUPIRAVIR EUA 200MG CAPSULE: 4.0000 | ORAL_CAPSULE | Freq: Two times a day (BID) | ORAL | 0 refills | Status: AC

## 2022-09-27 NOTE — Telephone Encounter (Signed)

## 2022-10-04 ENCOUNTER — Other Ambulatory Visit (HOSPITAL_COMMUNITY): Payer: Self-pay | Admitting: Psychiatry

## 2022-10-04 DIAGNOSIS — F331 Major depressive disorder, recurrent, moderate: Secondary | ICD-10-CM

## 2022-10-04 DIAGNOSIS — F5104 Psychophysiologic insomnia: Secondary | ICD-10-CM

## 2022-10-04 DIAGNOSIS — F410 Panic disorder [episodic paroxysmal anxiety] without agoraphobia: Secondary | ICD-10-CM

## 2022-10-13 ENCOUNTER — Ambulatory Visit (HOSPITAL_COMMUNITY): Payer: 59 | Admitting: Clinical

## 2022-10-13 ENCOUNTER — Other Ambulatory Visit: Payer: Self-pay | Admitting: Family Medicine

## 2022-10-13 DIAGNOSIS — E538 Deficiency of other specified B group vitamins: Secondary | ICD-10-CM

## 2022-10-19 ENCOUNTER — Other Ambulatory Visit (HOSPITAL_COMMUNITY): Payer: Self-pay | Admitting: Psychiatry

## 2022-10-19 DIAGNOSIS — F331 Major depressive disorder, recurrent, moderate: Secondary | ICD-10-CM

## 2022-10-19 DIAGNOSIS — M159 Polyosteoarthritis, unspecified: Secondary | ICD-10-CM

## 2022-10-19 DIAGNOSIS — F410 Panic disorder [episodic paroxysmal anxiety] without agoraphobia: Secondary | ICD-10-CM

## 2022-10-25 ENCOUNTER — Other Ambulatory Visit (HOSPITAL_COMMUNITY): Payer: Self-pay | Admitting: Psychiatry

## 2022-10-25 ENCOUNTER — Telehealth (INDEPENDENT_AMBULATORY_CARE_PROVIDER_SITE_OTHER): Payer: 59 | Admitting: Psychiatry

## 2022-10-25 DIAGNOSIS — F411 Generalized anxiety disorder: Secondary | ICD-10-CM

## 2022-10-25 DIAGNOSIS — M159 Polyosteoarthritis, unspecified: Secondary | ICD-10-CM

## 2022-10-25 DIAGNOSIS — F331 Major depressive disorder, recurrent, moderate: Secondary | ICD-10-CM

## 2022-10-25 DIAGNOSIS — F5104 Psychophysiologic insomnia: Secondary | ICD-10-CM

## 2022-10-25 DIAGNOSIS — F502 Bulimia nervosa: Secondary | ICD-10-CM | POA: Diagnosis not present

## 2022-10-25 DIAGNOSIS — Z9884 Bariatric surgery status: Secondary | ICD-10-CM

## 2022-10-25 DIAGNOSIS — F410 Panic disorder [episodic paroxysmal anxiety] without agoraphobia: Secondary | ICD-10-CM

## 2022-10-25 MED ORDER — DULOXETINE HCL 30 MG PO CPEP: 90.0000 mg | ORAL_CAPSULE | Freq: Every day | ORAL | 1 refills | Status: DC

## 2022-10-25 NOTE — Patient Instructions (Signed)
We increased her Cymbalta to 90 mg once daily, this will come in three 30 mg capsules.  Do continue to consider getting a nutrition referral.
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
## 2022-10-27 ENCOUNTER — Other Ambulatory Visit: Payer: Self-pay | Admitting: Family Medicine

## 2022-11-09 ENCOUNTER — Encounter: Payer: Self-pay | Admitting: Family Medicine

## 2022-11-11 ENCOUNTER — Ambulatory Visit (INDEPENDENT_AMBULATORY_CARE_PROVIDER_SITE_OTHER): Payer: Managed Care, Other (non HMO) | Admitting: Family Medicine

## 2022-11-11 ENCOUNTER — Encounter: Payer: Self-pay | Admitting: Family Medicine

## 2022-11-11 VITALS — BP 123/79 | HR 68 | Temp 98.3°F | Ht 63.0 in | Wt 210.6 lb

## 2022-11-11 DIAGNOSIS — F339 Major depressive disorder, recurrent, unspecified: Secondary | ICD-10-CM | POA: Diagnosis not present

## 2022-11-11 DIAGNOSIS — Z9884 Bariatric surgery status: Secondary | ICD-10-CM

## 2022-11-11 DIAGNOSIS — F411 Generalized anxiety disorder: Secondary | ICD-10-CM | POA: Diagnosis not present

## 2022-11-11 DIAGNOSIS — Z6839 Body mass index (BMI) 39.0-39.9, adult: Secondary | ICD-10-CM

## 2022-11-11 NOTE — Progress Notes (Signed)
Subjective: CC: Mood follow-up, obesity PCP: Janora Norlander, DO BUL:AGTXM C Wool is a 67 y.o. female presenting to clinic today for:  1.  Obesity Patient reports that she has lost about 20 pounds with the compounded semaglutide.  She has not had any problems with nausea, vomiting, abdominal pain.  She feels the best that she ever has in quite some time.  She has been working very closely with Dr. Sheran Lawless for medication management and is now off of her benzodiazepine.  She was titrated up on Cymbalta and feels like this is really working well.  She is not as happy as she wants to be but overall feels the best that she has been in quite some time.  Goal is to get off of Abilify soon.  Still has some issues with sleep at bedtime but they are working through medication titrations before they pursue any type of sleep aids.   ROS: Per HPI  Allergies  Allergen Reactions   Iodinated Contrast Media Other (See Comments)   Iodine     REACTION: rash   Past Medical History:  Diagnosis Date   Anemia    Anxiety    Back pain    Bronchitis    Depression    GERD (gastroesophageal reflux disease)    Hypertension    Joint pain    Knee pain    Malignant neoplasm of upper-inner quadrant of left breast in female, estrogen receptor positive ([place]) 2021   s/p lumpectomy and 16 rounds radiation   Osteoarthritis    Sinus complaint    Sleep apnea    Squamous cell cancer of skin of nose 2021   Removed at [place], Primary Derm: Dr Kristian Covey easily    Vitamin B 12 deficiency    Wears glasses     Current Outpatient Medications:    ARIPiprazole (ABILIFY) 2 MG tablet, Take 1 tablet (2 mg total) by mouth at bedtime., Disp: 90 tablet, Rfl: 0   cyanocobalamin (VITAMIN B12) 1000 MCG/ML injection, INJECT 1 ML (1,000 MCG) INTRAMUSCULARLY EVERY 30 DAYS, Disp: 3 mL, Rfl: 0   DULoxetine (CYMBALTA) 30 MG capsule, Take 3 capsules (90 mg total) by mouth daily., Disp: 270 capsule, Rfl: 0   exemestane  (AROMASIN) 25 MG tablet, Take 25 mg by mouth daily after breakfast., Disp: , Rfl:    lisinopril-hydrochlorothiazide (ZESTORETIC) 20-25 MG tablet, TAKE 1 TABLET BY MOUTH EVERY DAY, Disp: 90 tablet, Rfl: 0   metoprolol succinate (TOPROL-XL) 50 MG 24 hr tablet, Take 2 tablets (100 mg total) by mouth at bedtime. Take with or immediately following a meal., Disp: 180 tablet, Rfl: 0   multivitamin-iron-minerals-folic acid (CENTRUM) chewable tablet, Chew 1 tablet by mouth daily., Disp: , Rfl:    NONFORMULARY OR COMPOUNDED ITEM, Semaglutide w/ b6 2.'5mg'$ /mL. Inject 10 units every 7 days x4 weeks, then 20 u every 7 d x4 weeks, then 40 u every 7 days x4 weeks, then 80 u every 7 days. Dispense QS for 1 month w/ 4 RFs, Disp: 2 each, Rfl: 4   rosuvastatin (CRESTOR) 5 MG tablet, Take 1 tablet (5 mg total) by mouth daily., Disp: 90 tablet, Rfl: 0   Syringe/Needle, Disp, (SYRINGE 3CC/25GX1") 25G X 1" 3 ML MISC, 1 Units by Does not apply route once a week., Disp: 4 each, Rfl: 12   Vitamin D, Ergocalciferol, (DRISDOL) 1.25 MG (50000 UNIT) CAPS capsule, TAKE 1 CAPSULE (50,000 UNITS TOTAL) BY MOUTH EVERY 7 (SEVEN) DAYS, Disp: 12 capsule, Rfl:  3 Social History   Socioeconomic History   Marital status: Married    Spouse name: Jimmy   Number of children: Not on file   Years of education: Not on file   Highest education level: Not on file  Occupational History   Occupation: Research officer, political party  Tobacco Use   Smoking status: Former    Types: Cigarettes   Smokeless tobacco: Never  Scientific laboratory technician Use: Never used  Substance and Sexual Activity   Alcohol use: Yes    Comment: couple of glasses of wine per week   Drug use: Never   Sexual activity: Yes  Other Topics Concern   Not on file  Social History Narrative   Not on file   Social Determinants of Health   Financial Resource Strain: Not on file  Food Insecurity: Not on file  Transportation Needs: Not on file  Physical Activity: Not on file  Stress:  Not on file  Social Connections: Not on file  Intimate Partner Violence: Not on file   Family History  Problem Relation Age of Onset   Heart disease Father    Hypertension Father    Sudden death Father    Sleep apnea Father    Hyperlipidemia Mother    Colon cancer Mother    Diabetes Mother    Depression Mother    Anxiety disorder Mother    Hyperlipidemia Sister     Objective: Office vital signs reviewed. BP 123/79   Pulse 68   Temp 98.3 F (36.8 C)   Ht '5\' 3"'$  (1.6 m)   Wt 210 lb 9.6 oz (95.5 kg)   LMP 06/01/2015   SpO2 97%   BMI 37.31 kg/m   Physical Examination:  General: Awake, alert, well nourished, No acute distress HEENT: sclera, MMM Cardio: regular rate and rhythm, S1S2 heard, no murmurs appreciated Pulm: clear to auscultation bilaterally, no wheezes, rhonchi or rales; normal work of breathing on room air  Assessment/ Plan: 67 y.o. female   Class 2 severe obesity with serious comorbidity and body mass index (BMI) of 39.0 to 39.9 in adult, unspecified obesity type ([place]) - Plan: CMP14+EGFR, TSH, Lipid panel  S/P gastric bypass - Plan: Vitamin B12, VITAMIN D 25 Hydroxy (Vit-D Deficiency, Fractures), CBC  Depression, recurrent (HCC) - Plan: TSH  Generalized anxiety disorder - Plan: TSH  Fasting labs placed.  She will come in at her earliest convenience to have these done.  Since that she is responding wonderfully to the medication changes by her psychiatrist.   No orders of the defined types were placed in this encounter.  No orders of the defined types were placed in this encounter.    Janora Norlander, DO [place] [phone]

## 2022-11-13 ENCOUNTER — Other Ambulatory Visit: Payer: Self-pay | Admitting: Family Medicine

## 2022-11-13 DIAGNOSIS — I10 Essential (primary) hypertension: Secondary | ICD-10-CM

## 2022-11-15 ENCOUNTER — Other Ambulatory Visit: Payer: Managed Care, Other (non HMO)

## 2022-11-15 DIAGNOSIS — F339 Major depressive disorder, recurrent, unspecified: Secondary | ICD-10-CM

## 2022-11-15 DIAGNOSIS — Z9884 Bariatric surgery status: Secondary | ICD-10-CM

## 2022-11-15 DIAGNOSIS — Z6839 Body mass index (BMI) 39.0-39.9, adult: Secondary | ICD-10-CM

## 2022-11-15 DIAGNOSIS — F411 Generalized anxiety disorder: Secondary | ICD-10-CM

## 2022-11-15 LAB — LIPID PANEL

## 2022-11-16 LAB — CMP14+EGFR
ALT: 11 IU/L (ref 0–32)
AST: 20 IU/L (ref 0–40)
Albumin/Globulin Ratio: 1.9 (ref 1.2–2.2)
Albumin: 4 g/dL (ref 3.9–4.9)
Alkaline Phosphatase: 93 IU/L (ref 44–121)
BUN/Creatinine Ratio: 18 (ref 12–28)
BUN: 14 mg/dL (ref 8–27)
Bilirubin Total: 0.5 mg/dL (ref 0.0–1.2)
CO2: 23 mmol/L (ref 20–29)
Calcium: 9.6 mg/dL (ref 8.7–10.3)
Chloride: 101 mmol/L (ref 96–106)
Creatinine, Ser: 0.8 mg/dL (ref 0.57–1.00)
Globulin, Total: 2.1 g/dL (ref 1.5–4.5)
Glucose: 107 mg/dL — ABNORMAL HIGH (ref 70–99)
Potassium: 3.8 mmol/L (ref 3.5–5.2)
Sodium: 145 mmol/L — ABNORMAL HIGH (ref 134–144)
Total Protein: 6.1 g/dL (ref 6.0–8.5)
eGFR: 82 mL/min/{1.73_m2} (ref >59)

## 2022-11-16 LAB — LIPID PANEL
Chol/HDL Ratio: 2.6 ratio (ref 0.0–4.4)
Cholesterol, Total: 127 mg/dL (ref 100–199)
HDL: 48 mg/dL (ref >39)
LDL Chol Calc (NIH): 53 mg/dL (ref 0–99)
Triglycerides: 153 mg/dL — ABNORMAL HIGH (ref 0–149)
VLDL Cholesterol Cal: 26 mg/dL (ref 5–40)

## 2022-11-16 LAB — CBC
Hematocrit: 44.2 % (ref 34.0–46.6)
Hemoglobin: 15.4 g/dL (ref 11.1–15.9)
MCH: 33.6 pg — ABNORMAL HIGH (ref 26.6–33.0)
MCHC: 34.8 g/dL (ref 31.5–35.7)
MCV: 96 fL (ref 79–97)
Platelets: 207 10*3/uL (ref 150–450)
RBC: 4.59 x10E6/uL (ref 3.77–5.28)
RDW: 13.1 % (ref 11.7–15.4)
WBC: 5.3 10*3/uL (ref 3.4–10.8)

## 2022-11-16 LAB — VITAMIN D 25 HYDROXY (VIT D DEFICIENCY, FRACTURES): Vit D, 25-Hydroxy: 67.5 ng/mL (ref 30.0–100.0)

## 2022-11-16 LAB — VITAMIN B12: Vitamin B-12: 603 pg/mL (ref 232–1245)

## 2022-11-16 LAB — TSH: TSH: 2.63 u[IU]/mL (ref 0.450–4.500)

## 2022-11-28 ENCOUNTER — Telehealth (INDEPENDENT_AMBULATORY_CARE_PROVIDER_SITE_OTHER): Payer: 59 | Admitting: Psychiatry

## 2022-11-28 DIAGNOSIS — F331 Major depressive disorder, recurrent, moderate: Secondary | ICD-10-CM | POA: Diagnosis not present

## 2022-11-28 DIAGNOSIS — M159 Polyosteoarthritis, unspecified: Secondary | ICD-10-CM | POA: Diagnosis not present

## 2022-11-28 DIAGNOSIS — Z8659 Personal history of other mental and behavioral disorders: Secondary | ICD-10-CM

## 2022-11-28 DIAGNOSIS — F411 Generalized anxiety disorder: Secondary | ICD-10-CM

## 2022-11-28 DIAGNOSIS — F410 Panic disorder [episodic paroxysmal anxiety] without agoraphobia: Secondary | ICD-10-CM

## 2022-11-28 DIAGNOSIS — F5104 Psychophysiologic insomnia: Secondary | ICD-10-CM

## 2022-11-28 MED ORDER — DULOXETINE HCL 30 MG PO CPEP: 90.0000 mg | ORAL_CAPSULE | Freq: Every day | ORAL | 0 refills | Status: DC

## 2022-11-28 MED ORDER — ARIPIPRAZOLE 2 MG PO TABS: 2.0000 mg | ORAL_TABLET | Freq: Every day | ORAL | 0 refills | Status: DC
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
## 2022-11-28 NOTE — Patient Instructions (Addendum)
We did not make any changes to her medication today but do check out this CBT-I worksheet which should help with some of the racing thoughts that you have around bedtime. https://www.sleepfoundation.org/insomnia/treatment/cognitive-behavioral-therapy-insomnia   The other thing that can help with this is trying to cut back on the amount of caffeinated tea you drink.  If you liked the taste then switching to decaf tea is a viable option.

## 2023-01-10 ENCOUNTER — Other Ambulatory Visit: Payer: Self-pay | Admitting: Family Medicine

## 2023-01-10 DIAGNOSIS — E538 Deficiency of other specified B group vitamins: Secondary | ICD-10-CM

## 2023-01-20 ENCOUNTER — Other Ambulatory Visit (HOSPITAL_COMMUNITY): Payer: Self-pay | Admitting: Psychiatry

## 2023-01-20 DIAGNOSIS — F410 Panic disorder [episodic paroxysmal anxiety] without agoraphobia: Secondary | ICD-10-CM

## 2023-01-20 DIAGNOSIS — M159 Polyosteoarthritis, unspecified: Secondary | ICD-10-CM

## 2023-01-20 DIAGNOSIS — F331 Major depressive disorder, recurrent, moderate: Secondary | ICD-10-CM

## 2023-01-24 ENCOUNTER — Other Ambulatory Visit: Payer: Self-pay | Admitting: Family Medicine

## 2023-01-26 ENCOUNTER — Telehealth (INDEPENDENT_AMBULATORY_CARE_PROVIDER_SITE_OTHER): Payer: 59 | Admitting: Psychiatry

## 2023-01-26 ENCOUNTER — Encounter (HOSPITAL_COMMUNITY): Payer: Self-pay | Admitting: Psychiatry

## 2023-01-26 DIAGNOSIS — F410 Panic disorder [episodic paroxysmal anxiety] without agoraphobia: Secondary | ICD-10-CM

## 2023-01-26 DIAGNOSIS — F5104 Psychophysiologic insomnia: Secondary | ICD-10-CM

## 2023-01-26 DIAGNOSIS — F3341 Major depressive disorder, recurrent, in partial remission: Secondary | ICD-10-CM | POA: Diagnosis not present

## 2023-01-26 DIAGNOSIS — Z8659 Personal history of other mental and behavioral disorders: Secondary | ICD-10-CM

## 2023-01-26 DIAGNOSIS — E559 Vitamin D deficiency, unspecified: Secondary | ICD-10-CM

## 2023-01-26 DIAGNOSIS — F411 Generalized anxiety disorder: Secondary | ICD-10-CM | POA: Diagnosis not present

## 2023-01-26 DIAGNOSIS — E538 Deficiency of other specified B group vitamins: Secondary | ICD-10-CM

## 2023-01-26 DIAGNOSIS — M159 Polyosteoarthritis, unspecified: Secondary | ICD-10-CM

## 2023-01-26 MED ORDER — DULOXETINE HCL 30 MG PO CPEP: 90.0000 mg | ORAL_CAPSULE | Freq: Every day | ORAL | 0 refills | Status: DC

## 2023-01-26 MED ORDER — ARIPIPRAZOLE 2 MG PO TABS: 2.0000 mg | ORAL_TABLET | Freq: Every day | ORAL | 0 refills | Status: DC

## 2023-01-26 NOTE — Patient Instructions (Signed)
We did not make any medication changes today.  Keep up the good work with finding ways to reduce your stress and enjoy time in retirement.
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
## 2023-02-10 ENCOUNTER — Other Ambulatory Visit: Payer: Self-pay | Admitting: Family Medicine

## 2023-02-10 DIAGNOSIS — I10 Essential (primary) hypertension: Secondary | ICD-10-CM

## 2023-03-07 ENCOUNTER — Other Ambulatory Visit: Payer: Self-pay | Admitting: Family Medicine

## 2023-03-07 DIAGNOSIS — E538 Deficiency of other specified B group vitamins: Secondary | ICD-10-CM

## 2023-03-14 ENCOUNTER — Ambulatory Visit (INDEPENDENT_AMBULATORY_CARE_PROVIDER_SITE_OTHER): Payer: Managed Care, Other (non HMO) | Admitting: Family Medicine

## 2023-03-14 ENCOUNTER — Encounter: Payer: Self-pay | Admitting: Family Medicine

## 2023-03-14 DIAGNOSIS — E782 Mixed hyperlipidemia: Secondary | ICD-10-CM | POA: Diagnosis not present

## 2023-03-14 DIAGNOSIS — Z9884 Bariatric surgery status: Secondary | ICD-10-CM | POA: Diagnosis not present

## 2023-03-14 NOTE — Progress Notes (Unsigned)
Subjective: CC:*** PCP: Raliegh Ip, DO Patricia Carr is a 67 y.o. female presenting to clinic today for:  1. ***   ROS: Per HPI  Allergies  Allergen Reactions   Iodinated Contrast Media Other (See Comments)   Iodine     REACTION: rash   Past Medical History:  Diagnosis Date   Anemia    Anxiety    Back pain    Bronchitis    Depression    GERD (gastroesophageal reflux disease)    Hypertension    Joint pain    Knee pain    Malignant neoplasm of upper-inner quadrant of left breast in female, estrogen receptor positive (HCC) 2021   s/p lumpectomy and 16 rounds radiation   Osteoarthritis    Sinus complaint    Sleep apnea    Squamous cell cancer of skin of nose 2021   Removed at [facility], Primary Derm: Dr Arlean Hopping easily    Vitamin B 12 deficiency    Wears glasses     Current Outpatient Medications:    ARIPiprazole (ABILIFY) 2 MG tablet, Take 1 tablet (2 mg total) by mouth at bedtime., Disp: 90 tablet, Rfl: 0   cyanocobalamin (VITAMIN B12) 1000 MCG/ML injection, INJECT 1 ML (1,000 MCG) INTRAMUSCULARLY EVERY 30 DAYS, Disp: 3 mL, Rfl: 0   DULoxetine (CYMBALTA) 30 MG capsule, Take 3 capsules (90 mg total) by mouth daily., Disp: 270 capsule, Rfl: 0   estradiol (ESTRACE) 0.1 MG/GM vaginal cream, Place vaginally., Disp: , Rfl:    exemestane (AROMASIN) 25 MG tablet, Take 25 mg by mouth daily after breakfast., Disp: , Rfl:    lisinopril-hydrochlorothiazide (ZESTORETIC) 20-25 MG tablet, TAKE 1 TABLET BY MOUTH EVERY DAY, Disp: 90 tablet, Rfl: 0   metoprolol succinate (TOPROL-XL) 50 MG 24 hr tablet, TAKE 2 TABLETS (100 MG TOTAL) BY MOUTH AT BEDTIME. TAKE WITH OR IMMEDIATELY FOLLOWING A MEAL., Disp: 180 tablet, Rfl: 0   multivitamin-iron-minerals-folic acid (CENTRUM) chewable tablet, Chew 1 tablet by mouth daily., Disp: , Rfl:    NONFORMULARY OR COMPOUNDED ITEM, Semaglutide w/ b6 2.5mg /mL. Inject 10 units every 7 days x4 weeks, then 20 u every 7 d x4 weeks, then  40 u every 7 days x4 weeks, then 80 u every 7 days. Dispense QS for 1 month w/ 4 RFs, Disp: 2 each, Rfl: 4   rosuvastatin (CRESTOR) 5 MG tablet, Take 1 tablet (5 mg total) by mouth daily., Disp: 90 tablet, Rfl: 0   Syringe/Needle, Disp, (SYRINGE 3CC/25GX1") 25G X 1" 3 ML MISC, 1 Units by Does not apply route once a week., Disp: 4 each, Rfl: 12   Vitamin D, Ergocalciferol, (DRISDOL) 1.25 MG (50000 UNIT) CAPS capsule, TAKE 1 CAPSULE (50,000 UNITS TOTAL) BY MOUTH EVERY 7 (SEVEN) DAYS, Disp: 12 capsule, Rfl: 3 Social History   Socioeconomic History   Marital status: Married    Spouse name: Jimmy   Number of children: Not on file   Years of education: Not on file   Highest education level: Not on file  Occupational History   Occupation: Librarian, academic  Tobacco Use   Smoking status: Former    Types: Cigarettes   Smokeless tobacco: Never  Vaping Use   Vaping Use: Never used  Substance and Sexual Activity   Alcohol use: Yes    Comment: couple of glasses of wine per week   Drug use: Never   Sexual activity: Yes  Other Topics Concern   Not on file  Social  History Narrative   Not on file   Social Determinants of Health   Financial Resource Strain: Not on file  Food Insecurity: Not on file  Transportation Needs: Not on file  Physical Activity: Not on file  Stress: Not on file  Social Connections: Not on file  Intimate Partner Violence: Not on file   Family History  Problem Relation Age of Onset   Heart disease Father    Hypertension Father    Sudden death Father    Sleep apnea Father    Hyperlipidemia Mother    Colon cancer Mother    Diabetes Mother    Depression Mother    Anxiety disorder Mother    Hyperlipidemia Sister     Objective: Office vital signs reviewed. BP 135/78   Pulse (!) 55   Temp 98.7 F (37.1 C)   Ht 5\' 3"  (1.6 m)   Wt 201 lb (91.2 kg)   LMP 06/01/2015   SpO2 97%   BMI 35.61 kg/m   Physical Examination:  General: Awake, alert, ***  nourished, No acute distress HEENT: Normal    Neck: No masses palpated. No lymphadenopathy    Ears: Tympanic membranes intact, normal light reflex, no erythema, no bulging    Eyes: PERRLA, extraocular membranes intact, sclera ***    Nose: nasal turbinates moist, *** nasal discharge    Throat: moist mucus membranes, no erythema, *** tonsillar exudate.  Airway is patent Cardio: regular rate and rhythm, S1S2 heard, no murmurs appreciated Pulm: clear to auscultation bilaterally, no wheezes, rhonchi or rales; normal work of breathing on room air GI: soft, non-tender, non-distended, bowel sounds present x4, no hepatomegaly, no splenomegaly, no masses GU: external vaginal tissue ***, cervix ***, *** punctate lesions on cervix appreciated, *** discharge from cervical os, *** bleeding, *** cervical motion tenderness, *** abdominal/ adnexal masses Extremities: warm, well perfused, No edema, cyanosis or clubbing; +*** pulses bilaterally MSK: *** gait and *** station Skin: dry; intact; no rashes or lesions Neuro: *** Strength and light touch sensation grossly intact, *** DTRs ***/4  Assessment/ Plan: 67 y.o. female   ***  No orders of the defined types were placed in this encounter.  No orders of the defined types were placed in this encounter.    Raliegh Ip, DO [facility] [phone]

## 2023-03-29 ENCOUNTER — Telehealth (HOSPITAL_COMMUNITY): Payer: 59 | Admitting: Psychiatry

## 2023-03-29 ENCOUNTER — Encounter (HOSPITAL_COMMUNITY): Payer: Self-pay | Admitting: Psychiatry

## 2023-03-29 ENCOUNTER — Encounter: Payer: Self-pay | Admitting: Family Medicine

## 2023-03-29 ENCOUNTER — Telehealth (INDEPENDENT_AMBULATORY_CARE_PROVIDER_SITE_OTHER): Payer: 59 | Admitting: Psychiatry

## 2023-03-29 DIAGNOSIS — G4733 Obstructive sleep apnea (adult) (pediatric): Secondary | ICD-10-CM

## 2023-03-29 DIAGNOSIS — F410 Panic disorder [episodic paroxysmal anxiety] without agoraphobia: Secondary | ICD-10-CM

## 2023-03-29 DIAGNOSIS — E538 Deficiency of other specified B group vitamins: Secondary | ICD-10-CM

## 2023-03-29 DIAGNOSIS — Z79899 Other long term (current) drug therapy: Secondary | ICD-10-CM

## 2023-03-29 DIAGNOSIS — F5104 Psychophysiologic insomnia: Secondary | ICD-10-CM

## 2023-03-29 DIAGNOSIS — M159 Polyosteoarthritis, unspecified: Secondary | ICD-10-CM

## 2023-03-29 DIAGNOSIS — F411 Generalized anxiety disorder: Secondary | ICD-10-CM

## 2023-03-29 DIAGNOSIS — F3341 Major depressive disorder, recurrent, in partial remission: Secondary | ICD-10-CM

## 2023-03-29 DIAGNOSIS — E559 Vitamin D deficiency, unspecified: Secondary | ICD-10-CM

## 2023-03-29 HISTORY — DX: Other long term (current) drug therapy: Z79.899

## 2023-03-29 MED ORDER — ARIPIPRAZOLE 2 MG PO TABS: 2.0000 mg | ORAL_TABLET | Freq: Every day | ORAL | 0 refills | Status: DC

## 2023-03-29 MED ORDER — DULOXETINE HCL 30 MG PO CPEP
90.0000 mg | ORAL_CAPSULE | Freq: Every day | ORAL | 0 refills | Status: DC
Start: 2023-03-29 — End: 2023-06-29
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
## 2023-03-29 NOTE — Patient Instructions (Signed)
We did not make any medication changes today.  Do try to make that nutrition referral and continue to cut back on the caffeinated tea as much as you can and that will help with the racing thoughts at night.  I will coordinate with your PCP to get and updated EKG along with lipid panel and A1c.

## 2023-04-04 ENCOUNTER — Other Ambulatory Visit: Payer: Self-pay | Admitting: Family Medicine

## 2023-04-04 ENCOUNTER — Ambulatory Visit (INDEPENDENT_AMBULATORY_CARE_PROVIDER_SITE_OTHER): Payer: Managed Care, Other (non HMO) | Admitting: Family Medicine

## 2023-04-04 ENCOUNTER — Encounter: Payer: Self-pay | Admitting: Family Medicine

## 2023-04-04 VITALS — BP 132/74 | HR 66 | Temp 98.5°F | Ht 63.0 in | Wt 196.0 lb

## 2023-04-04 DIAGNOSIS — Z5181 Encounter for therapeutic drug level monitoring: Secondary | ICD-10-CM

## 2023-04-04 DIAGNOSIS — F339 Major depressive disorder, recurrent, unspecified: Secondary | ICD-10-CM | POA: Diagnosis not present

## 2023-04-04 DIAGNOSIS — Z1211 Encounter for screening for malignant neoplasm of colon: Secondary | ICD-10-CM

## 2023-04-04 DIAGNOSIS — E782 Mixed hyperlipidemia: Secondary | ICD-10-CM

## 2023-04-04 DIAGNOSIS — E559 Vitamin D deficiency, unspecified: Secondary | ICD-10-CM

## 2023-04-04 LAB — CMP14+EGFR
ALT: 14 IU/L (ref 0–32)
AST: 20 IU/L (ref 0–40)
Albumin/Globulin Ratio: 2.2 (ref 1.2–2.2)
Albumin: 4 g/dL (ref 3.9–4.9)
Alkaline Phosphatase: 100 IU/L (ref 44–121)
BUN/Creatinine Ratio: 11 — ABNORMAL LOW (ref 12–28)
BUN: 10 mg/dL (ref 8–27)
Bilirubin Total: 0.4 mg/dL (ref 0.0–1.2)
CO2: 29 mmol/L (ref 20–29)
Calcium: 9.7 mg/dL (ref 8.7–10.3)
Chloride: 99 mmol/L (ref 96–106)
Creatinine, Ser: 0.89 mg/dL (ref 0.57–1.00)
Globulin, Total: 1.8 g/dL (ref 1.5–4.5)
Glucose: 98 mg/dL (ref 70–99)
Potassium: 4.1 mmol/L (ref 3.5–5.2)
Sodium: 141 mmol/L (ref 134–144)
Total Protein: 5.8 g/dL — ABNORMAL LOW (ref 6.0–8.5)
eGFR: 72 mL/min/1.73

## 2023-04-04 LAB — LIPID PANEL
Chol/HDL Ratio: 3 ratio (ref 0.0–4.4)
Cholesterol, Total: 154 mg/dL (ref 100–199)
HDL: 51 mg/dL (ref >39)
LDL Chol Calc (NIH): 83 mg/dL (ref 0–99)
Triglycerides: 108 mg/dL (ref 0–149)
VLDL Cholesterol Cal: 20 mg/dL (ref 5–40)

## 2023-04-04 MED ORDER — ROSUVASTATIN CALCIUM 5 MG PO TABS: 5.0000 mg | ORAL_TABLET | Freq: Every day | ORAL | 3 refills | Status: DC

## 2023-04-04 NOTE — Telephone Encounter (Signed)
Ok to refill? Last Vit D level was 11/15/22 -67.5

## 2023-04-04 NOTE — Progress Notes (Signed)
Subjective: CC:med monitoring PCP: Raliegh Ip, DO GNF:AOZHY C Patricia Carr is a 67 y.o. female presenting to clinic today for:  Depressive disorder She is under the care of Dr. Adrian Blackwater, psychiatry.  He is requesting an EKG as she is on an atypical antipsychotic.  She is fasting labs ordered.  Tolerating medications without difficulty so far.  She has no concerns   ROS: Per HPI  Allergies  Allergen Reactions   Iodinated Contrast Media Other (See Comments)   Iodine     REACTION: rash   Past Medical History:  Diagnosis Date   Anemia    Anxiety    Back pain    Bronchitis    Depression    GERD (gastroesophageal reflux disease)    Hypertension    Joint pain    Knee pain    Malignant neoplasm of upper-inner quadrant of left breast in female, estrogen receptor positive (HCC) 2021   s/p lumpectomy and 16 rounds radiation   Osteoarthritis    Sinus complaint    Sleep apnea    Squamous cell cancer of skin of nose 2021   [facility]: Dr Arlean Hopping easily    Vitamin B 12 deficiency    Wears glasses     Current Outpatient Medications:    ARIPiprazole (ABILIFY) 2 MG tablet, Take 1 tablet (2 mg total) by mouth at bedtime., Disp: 90 tablet, Rfl: 0   cyanocobalamin (VITAMIN B12) 1000 MCG/ML injection, INJECT 1 ML (1,000 MCG) INTRAMUSCULARLY EVERY 30 DAYS, Disp: 3 mL, Rfl: 0   DULoxetine (CYMBALTA) 30 MG capsule, Take 3 capsules (90 mg total) by mouth daily., Disp: 270 capsule, Rfl: 0   estradiol (ESTRACE) 0.1 MG/GM vaginal cream, Place vaginally., Disp: , Rfl:    exemestane (AROMASIN) 25 MG tablet, Take 25 mg by mouth daily after breakfast., Disp: , Rfl:    lisinopril-hydrochlorothiazide (ZESTORETIC) 20-25 MG tablet, TAKE 1 TABLET BY MOUTH EVERY DAY, Disp: 90 tablet, Rfl: 0   metoprolol succinate (TOPROL-XL) 50 MG 24 hr tablet, TAKE 2 TABLETS (100 MG TOTAL) BY MOUTH AT BEDTIME. TAKE WITH OR IMMEDIATELY FOLLOWING A MEAL., Disp: 180 tablet, Rfl: 0    multivitamin-iron-minerals-folic acid (CENTRUM) chewable tablet, Chew 1 tablet by mouth daily., Disp: , Rfl:    NONFORMULARY OR COMPOUNDED ITEM, Semaglutide w/ b6 2.5mg /mL. Inject 10 units every 7 days x4 weeks, then 20 u every 7 d x4 weeks, then 40 u every 7 days x4 weeks, then 80 u every 7 days. Dispense QS for 1 month w/ 4 RFs, Disp: 2 each, Rfl: 4   rosuvastatin (CRESTOR) 5 MG tablet, Take 1 tablet (5 mg total) by mouth daily., Disp: 90 tablet, Rfl: 0   Syringe/Needle, Disp, (SYRINGE 3CC/25GX1") 25G X 1" 3 ML MISC, 1 Units by Does not apply route once a week., Disp: 4 each, Rfl: 12   Vitamin D, Ergocalciferol, (DRISDOL) 1.25 MG (50000 UNIT) CAPS capsule, TAKE 1 CAPSULE (50,000 UNITS TOTAL) BY MOUTH EVERY 7 (SEVEN) DAYS, Disp: 12 capsule, Rfl: 3 Social History   Socioeconomic History   Marital status: Married    Spouse name: Jimmy   Number of children: Not on file   Years of education: Not on file   Highest education level: Associate degree: academic program  Occupational History   Occupation: Librarian, academic  Tobacco Use   Smoking status: Former    Types: Cigarettes   Smokeless tobacco: Never  Vaping Use   Vaping Use: Never used  Substance and Sexual Activity   Alcohol use: Yes    Comment: couple of glasses of wine per week   Drug use: Never   Sexual activity: Yes  Other Topics Concern   Not on file  Social History Narrative   Not on file   Social Determinants of Health   Financial Resource Strain: Low Risk  (04/03/2023)   Overall Financial Resource Strain (CARDIA)    Difficulty of Paying Living Expenses: Not hard at all  Food Insecurity: No Food Insecurity (04/03/2023)   Hunger Vital Sign    Worried About Running Out of Food in the Last Year: Never true    Ran Out of Food in the Last Year: Never true  Transportation Needs: No Transportation Needs (04/03/2023)   PRAPARE - Administrator, Civil Service (Medical): No    Lack of Transportation (Non-Medical):  No  Physical Activity: Unknown (04/03/2023)   Exercise Vital Sign    Days of Exercise per Week: 0 days    Minutes of Exercise per Session: Not on file  Stress: No Stress Concern Present (04/03/2023)   [employer] of Occupational Health - Occupational Stress Questionnaire    Feeling of Stress : Only a little  Social Connections: Moderately Integrated (04/03/2023)   Social Connection and Isolation Panel [NHANES]    Frequency of Communication with Friends and Family: More than three times a week    Frequency of Social Gatherings with Friends and Family: Twice a week    Attends Religious Services: More than 4 times per year    Active Member of [employer] or Organizations: No    Attends Engineer, structural: Not on file    Marital Status: Married  Catering manager Violence: Not on file   Family History  Problem Relation Age of Onset   Heart disease Father    Hypertension Father    Sudden death Father    Sleep apnea Father    Hyperlipidemia Mother    Colon cancer Mother    Diabetes Mother    Depression Mother    Anxiety disorder Mother    Hyperlipidemia Sister     Objective: Office vital signs reviewed. BP 132/74   Pulse 66   Temp 98.5 F (36.9 C)   Ht 5\' 3"  (1.6 m)   Wt 196 lb (88.9 kg)   LMP 06/01/2015   SpO2 96%   BMI 34.72 kg/m   Physical Examination:  General: Awake, alert, well nourished, No acute distress HEENT: sclera white, MMM Cardio: regular rate and rhythm, S1S2 heard, no murmurs appreciated Pulm: clear to auscultation bilaterally, no wheezes, rhonchi or rales; normal work of breathing on room air  Assessment/ Plan: 67 y.o. female   Depression, recurrent (HCC) - Plan: EKG 12-Lead  Encounter for medication monitoring - Plan: EKG 12-Lead  Morbid obesity (HCC) - Plan: CMP14+EGFR, Lipid panel, Bayer DCA Hb A1c Waived  Mixed hyperlipidemia - Plan: Lipid panel, rosuvastatin (CRESTOR) 5 MG tablet  Screen for colon cancer - Plan: Cologuard  Doing well  with current medications as prescribed by her psychiatrist.  EKG without evidence of QTc prolongation.  I sent this result to her psychiatrist as FYI  Check fasting labs.  Crestor renewed.  Orders Placed This Encounter  Procedures   EKG 12-Lead   No orders of the defined types were placed in this encounter.    Raliegh Ip, DO [facility] [phone]

## 2023-04-06 ENCOUNTER — Ambulatory Visit: Payer: Managed Care, Other (non HMO)

## 2023-04-06 LAB — BAYER DCA HB A1C WAIVED: HB A1C (BAYER DCA - WAIVED): 4.9 % (ref 4.8–5.6)

## 2023-04-10 LAB — HM MAMMOGRAPHY

## 2023-04-11 ENCOUNTER — Encounter: Payer: Self-pay | Admitting: Family Medicine

## 2023-04-21 ENCOUNTER — Other Ambulatory Visit: Payer: Self-pay | Admitting: Family Medicine

## 2023-04-23 LAB — COLOGUARD: COLOGUARD: POSITIVE — AB

## 2023-04-24 ENCOUNTER — Other Ambulatory Visit: Payer: Self-pay | Admitting: Family Medicine

## 2023-04-24 DIAGNOSIS — R195 Other fecal abnormalities: Secondary | ICD-10-CM

## 2023-05-08 NOTE — Progress Notes (Unsigned)
GI Office Note    Referring Provider: Raliegh Ip, DO Primary Care Physician:  Raliegh Ip, DO  Primary Gastroenterologist: Gerrit Friends.Rourk, MD  Chief Complaint   Chief Complaint  Patient presents with   positive cologaurd   History of Present Illness   Patricia Carr is a 67 y.o. female presenting today at the request of Gottschalk, Ashly M, DO for positive Cologuard.   Colonoscopy December 2002: 1. Normal rectum. 2. A few scattered left-sided diverticula. 3. Remainder of colonic mucosa appeared normal. Repeat in 5 years  EGD Feb 2011: -Erosive reflux esophagitis -bile stained mucosa in esophagus -small hiatal hernia  [place]onoscopy in 2017 ([place] at [facility]): report unavailable  Cologuard negative in 2021 but recently positive 04/16/23.   Family history of colon cancer in her mother. She states she has done 2 or 3 Cologuard's.   She has been scared to do the colonoscopy due to fear of prep. She attempted since and with drinking the prep she vomited.   No changes in bowel habits, no constipation or diarrhea, melena, brbrp, chest pain, shortness of breath, dizziness. Denies any kidney disease.   Has some mild reflux but this is controlled off any medication. Occasional tums use.   Has had some nausea but that is related to [facility] (pays out of pocket) and has lost 30 lbs over the last year.  Lost over a 100lbs after gastric bypass. (Highest was 252) - got down to 150.   She has had genetic testing and has no indicators for colon cancer.   Current Outpatient Medications  Medication Sig Dispense Refill   ARIPiprazole (ABILIFY) 2 MG tablet Take 1 tablet (2 mg total) by mouth at bedtime. 90 tablet 0   cyanocobalamin (VITAMIN B12) 1000 MCG/ML injection INJECT 1 ML (1,000 MCG) INTRAMUSCULARLY EVERY 30 DAYS 3 mL 0   DULoxetine (CYMBALTA) 30 MG capsule Take 3 capsules (90 mg total) by mouth daily. 270 capsule 0   estradiol (ESTRACE) 0.1 MG/GM  vaginal cream Place vaginally.     exemestane (AROMASIN) 25 MG tablet Take 25 mg by mouth daily after breakfast.     lisinopril-hydrochlorothiazide (ZESTORETIC) 20-25 MG tablet TAKE 1 TABLET BY MOUTH EVERY DAY 90 tablet 0   metoprolol succinate (TOPROL-XL) 50 MG 24 hr tablet TAKE 2 TABLETS (100 MG TOTAL) BY MOUTH AT BEDTIME. TAKE WITH OR IMMEDIATELY FOLLOWING A MEAL. 180 tablet 0   multivitamin-iron-minerals-folic acid (CENTRUM) chewable tablet Chew 1 tablet by mouth daily.     NONFORMULARY OR COMPOUNDED ITEM Semaglutide w/ b6 2.5mg /mL. Inject 10 units every 7 days x4 weeks, then 20 u every 7 d x4 weeks, then 40 u every 7 days x4 weeks, then 80 u every 7 days. Dispense QS for 1 month w/ 4 RFs 2 each 4   rosuvastatin (CRESTOR) 5 MG tablet Take 1 tablet (5 mg total) by mouth daily. 90 tablet 3   Syringe/Needle, Disp, (SYRINGE 3CC/25GX1") 25G X 1" 3 ML MISC 1 Units by Does not apply route once a week. 4 each 12   Vitamin D, Ergocalciferol, (DRISDOL) 1.25 MG (50000 UNIT) CAPS capsule TAKE 1 CAPSULE (50,000 UNITS TOTAL) BY MOUTH EVERY 7 (SEVEN) DAYS 12 capsule 3   No current facility-administered medications for this visit.    Past Medical History:  Diagnosis Date   Anemia    Anxiety    Back pain    Bronchitis    Depression    GERD (gastroesophageal reflux disease)  Hypertension    Joint pain    Knee pain    Malignant neoplasm of upper-inner quadrant of left breast in female, estrogen receptor positive (HCC) 2021   s/p lumpectomy and 16 rounds radiation   Osteoarthritis    Sinus complaint    Sleep apnea    Squamous cell cancer of skin of nose 2021   [facility]erm: Dr Arlean Hopping easily    Vitamin B 12 deficiency    Wears glasses     Past Surgical History:  Procedure Laterality Date   BREAST LUMPECTOMY WITH AXILLARY LYMPH NODE BIOPSY  2021   LEFT   COLONOSCOPY     REPLACEMENT TOTAL KNEE Right    ROUX-EN-Y PROCEDURE  03/2011   TUBAL LIGATION  1985     Family History  Problem Relation Age of Onset   Heart disease Father    Hypertension Father    Sudden death Father    Sleep apnea Father    Hyperlipidemia Mother    Colon cancer Mother    Diabetes Mother    Depression Mother    Anxiety disorder Mother    Hyperlipidemia Sister     Allergies as of 05/09/2023 - Review Complete 05/09/2023  Allergen Reaction Noted   Iodinated contrast media Other (See Comments) 12/30/2021   Iodine  12/17/2009    Social History   Socioeconomic History   Marital status: Married    Spouse name: Personnel officer   Number of children: Not on file   Years of education: Not on file   Highest education level: Associate degree: academic program  Occupational History   Occupation: Librarian, academic  Tobacco Use   Smoking status: Former    Types: Cigarettes   Smokeless tobacco: Never  Building services engineer Use: Never used  Substance and Sexual Activity   Alcohol use: Yes    Comment: couple of glasses of wine per week   Drug use: Never   Sexual activity: Yes  Other Topics Concern   Not on file  Social History Narrative   Not on file   Social Determinants of Health   Financial Resource Strain: Low Risk  (04/03/2023)   Overall Financial Resource Strain (CARDIA)    Difficulty of Paying Living Expenses: Not hard at all  Food Insecurity: No Food Insecurity (04/03/2023)   Hunger Vital Sign    Worried About Running Out of Food in the Last Year: Never true    Ran Out of Food in the Last Year: Never true  Transportation Needs: No Transportation Needs (04/03/2023)   PRAPARE - Administrator, Civil Service (Medical): No    Lack of Transportation (Non-Medical): No  Physical Activity: Unknown (04/03/2023)   Exercise Vital Sign    Days of Exercise per Week: 0 days    Minutes of Exercise per Session: Not on file  Stress: No Stress Concern Present (04/03/2023)   [employer] of Occupational Health - Occupational Stress Questionnaire    Feeling of  Stress : Only a little  Social Connections: Moderately Integrated (04/03/2023)   Social Connection and Isolation Panel [NHANES]    Frequency of Communication with Friends and Family: More than three times a week    Frequency of Social Gatherings with Friends and Family: Twice a week    Attends Religious Services: More than 4 times per year    Active Member of [employer] or Organizations: No    Attends Banker Meetings: Not on  file    Marital Status: Married  Catering manager Violence: Not on file   Review of Systems   Gen: Denies any fever, chills, fatigue, weight loss, lack of appetite.  CV: Denies chest pain, heart palpitations, peripheral edema, syncope.  Resp: Denies shortness of breath at rest or with exertion. Denies wheezing or cough.  GI: see HPI GU : Denies urinary burning, urinary frequency, urinary hesitancy MS: Denies joint pain, muscle weakness, cramps, or limitation of movement.  Derm: Denies rash, itching, dry skin Psych: Denies depression, anxiety, memory loss, and confusion Heme: Denies bruising, bleeding, and enlarged lymph nodes.  Physical Exam   BP 126/80 (BP Location: Right Arm, Patient Position: Sitting, Cuff Size: Normal)   Pulse 73   Temp 97.9 F (36.6 C) (Temporal)   Ht 5\' 3"  (1.6 m)   Wt 194 lb 6.4 oz (88.2 kg)   LMP 06/01/2015   SpO2 94%   BMI 34.44 kg/m   General:   Alert and oriented. Pleasant and cooperative. Well-nourished and well-developed.  Head:  Normocephalic and atraumatic. Eyes:  Without icterus, sclera clear and conjunctiva pink.  Ears:  Normal auditory acuity. Mouth:  No deformity or lesions, oral mucosa pink.  Lungs:  Clear to auscultation bilaterally. No wheezes, rales, or rhonchi. No distress.  Heart:  S1, S2 present without murmurs appreciated.  Abdomen:  +BS, soft, non-tender and non-distended. No HSM noted. No guarding or rebound. No masses appreciated.  Rectal:  Deferred  Msk:  Symmetrical without gross deformities.  Normal posture. Extremities:  Without edema. Neurologic:  Alert and  oriented x4;  grossly normal neurologically. Skin:  Intact without significant lesions or rashes. Psych:  Alert and cooperative. Normal mood and affect.   Assessment   KORISSA SWEETIN is a 67 y.o. 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   Positive Cologuard, family history of colon cancer: Family history of colon cancer in her mother.  Has had prior colonoscopies in the past and has had at least 2 negative Cologuard's prior to her most recent positive Cologuard.  Prior colonoscopies without any evidence of polyps.  She has no alarm symptoms at this time but given positive Cologuard we discussed reasoning behind needing to perform colonoscopy and also discussed that there are false positives and negatives.  She has not completed a colonoscopy since her gastric bypass given she has failed attempt at traditional prep.  We discussed that given she has good kidney function we will attempt a lower volume prep such as Clenpiq or Suprep given her history of gastric bypass.  We also discussed the need to ensure adequate hydration to ensure this works.  Will provide a small amount of Zofran to ensure she does not having significant nausea during prep.  GERD: Well-controlled off any medication.  Has very rare symptoms which is relieved with Tums.  PLAN   Proceed with colonoscopy with propofol by Dr. Jena Gauss in near future: the risks, benefits, and alternatives have been discussed with the patient in detail. The patient states understanding and desires to proceed. ASA 2 Hold Wegovy for 1 week.  Suprep or clenpiq given bypass history.  Small amount of zofran in case nausea occurs with prep Follow up as needed.     Brooke Bonito, MSN, FNP-BC, AGACNP-BC [facility]

## 2023-05-08 NOTE — H&P (View-Only) (Signed)
GI Office Note    Referring Provider: Raliegh Ip, DO Primary Care Physician:  Raliegh Ip, DO  Primary Gastroenterologist: Gerrit Friends.Rourk, MD  Chief Complaint   Chief Complaint  Patient presents with   positive cologaurd   History of Present Illness   Patricia Carr is a 67 y.o. female presenting today at the request of Gottschalk, Ashly M, DO for positive Cologuard.   Colonoscopy December 2002: 1. Normal rectum. 2. A few scattered left-sided diverticula. 3. Remainder of colonic mucosa appeared normal. Repeat in 5 years  EGD Feb 2011: -Erosive reflux esophagitis -bile stained mucosa in esophagus -small hiatal hernia  Colonoscopy in 2017 ([place] at [facility]): report unavailable  Cologuard negative in 2021 but recently positive 04/16/23.   Family history of colon cancer in her mother. She states she has done 2 or 3 Cologuard's.   She has been scared to do the colonoscopy due to fear of prep. She attempted since and with drinking the prep she vomited.   No changes in bowel habits, no constipation or diarrhea, melena, brbrp, chest pain, shortness of breath, dizziness. Denies any kidney disease.   Has some mild reflux but this is controlled off any medication. Occasional tums use.   Has had some nausea but that is related to [facility] (pays out of pocket) and has lost 30 lbs over the last year.  Lost over a 100lbs after gastric bypass. (Highest was 252) - got down to 150.   She has had genetic testing and has no indicators for colon cancer.   Current Outpatient Medications  Medication Sig Dispense Refill   ARIPiprazole (ABILIFY) 2 MG tablet Take 1 tablet (2 mg total) by mouth at bedtime. 90 tablet 0   cyanocobalamin (VITAMIN B12) 1000 MCG/ML injection INJECT 1 ML (1,000 MCG) INTRAMUSCULARLY EVERY 30 DAYS 3 mL 0   DULoxetine (CYMBALTA) 30 MG capsule Take 3 capsules (90 mg total) by mouth daily. 270 capsule 0   estradiol (ESTRACE) 0.1 MG/GM  vaginal cream Place vaginally.     exemestane (AROMASIN) 25 MG tablet Take 25 mg by mouth daily after breakfast.     lisinopril-hydrochlorothiazide (ZESTORETIC) 20-25 MG tablet TAKE 1 TABLET BY MOUTH EVERY DAY 90 tablet 0   metoprolol succinate (TOPROL-XL) 50 MG 24 hr tablet TAKE 2 TABLETS (100 MG TOTAL) BY MOUTH AT BEDTIME. TAKE WITH OR IMMEDIATELY FOLLOWING A MEAL. 180 tablet 0   multivitamin-iron-minerals-folic acid (CENTRUM) chewable tablet Chew 1 tablet by mouth daily.     NONFORMULARY OR COMPOUNDED ITEM Semaglutide w/ b6 2.5mg /mL. Inject 10 units every 7 days x4 weeks, then 20 u every 7 d x4 weeks, then 40 u every 7 days x4 weeks, then 80 u every 7 days. Dispense QS for 1 month w/ 4 RFs 2 each 4   rosuvastatin (CRESTOR) 5 MG tablet Take 1 tablet (5 mg total) by mouth daily. 90 tablet 3   Syringe/Needle, Disp, (SYRINGE 3CC/25GX1") 25G X 1" 3 ML MISC 1 Units by Does not apply route once a week. 4 each 12   Vitamin D, Ergocalciferol, (DRISDOL) 1.25 MG (50000 UNIT) CAPS capsule TAKE 1 CAPSULE (50,000 UNITS TOTAL) BY MOUTH EVERY 7 (SEVEN) DAYS 12 capsule 3   No current facility-administered medications for this visit.    Past Medical History:  Diagnosis Date   Anemia    Anxiety    Back pain    Bronchitis    Depression    GERD (gastroesophageal reflux disease)  Hypertension    Joint pain    Knee pain    Malignant neoplasm of upper-inner quadrant of left breast in female, estrogen receptor positive (HCC) 2021   s/p lumpectomy and 16 rounds radiation   Osteoarthritis    Sinus complaint    Sleep apnea    Squamous cell cancer of skin of nose 2021   [facility]erm: Dr Arlean Hopping easily    Vitamin B 12 deficiency    Wears glasses     Past Surgical History:  Procedure Laterality Date   BREAST LUMPECTOMY WITH AXILLARY LYMPH NODE BIOPSY  2021   LEFT   COLONOSCOPY     REPLACEMENT TOTAL KNEE Right    ROUX-EN-Y PROCEDURE  03/2011   TUBAL LIGATION  1985     Family History  Problem Relation Age of Onset   Heart disease Father    Hypertension Father    Sudden death Father    Sleep apnea Father    Hyperlipidemia Mother    Colon cancer Mother    Diabetes Mother    Depression Mother    Anxiety disorder Mother    Hyperlipidemia Sister     Allergies as of 05/09/2023 - Review Complete 05/09/2023  Allergen Reaction Noted   Iodinated contrast media Other (See Comments) 12/30/2021   Iodine  12/17/2009    Social History   Socioeconomic History   Marital status: Married    Spouse name: Personnel officer   Number of children: Not on file   Years of education: Not on file   Highest education level: Associate degree: academic program  Occupational History   Occupation: Librarian, academic  Tobacco Use   Smoking status: Former    Types: Cigarettes   Smokeless tobacco: Never  Building services engineer Use: Never used  Substance and Sexual Activity   Alcohol use: Yes    Comment: couple of glasses of wine per week   Drug use: Never   Sexual activity: Yes  Other Topics Concern   Not on file  Social History Narrative   Not on file   Social Determinants of Health   Financial Resource Strain: Low Risk  (04/03/2023)   Overall Financial Resource Strain (CARDIA)    Difficulty of Paying Living Expenses: Not hard at all  Food Insecurity: No Food Insecurity (04/03/2023)   Hunger Vital Sign    Worried About Running Out of Food in the Last Year: Never true    Ran Out of Food in the Last Year: Never true  Transportation Needs: No Transportation Needs (04/03/2023)   PRAPARE - Administrator, Civil Service (Medical): No    Lack of Transportation (Non-Medical): No  Physical Activity: Unknown (04/03/2023)   Exercise Vital Sign    Days of Exercise per Week: 0 days    Minutes of Exercise per Session: Not on file  Stress: No Stress Concern Present (04/03/2023)   [employer] of Occupational Health - Occupational Stress Questionnaire    Feeling of  Stress : Only a little  Social Connections: Moderately Integrated (04/03/2023)   Social Connection and Isolation Panel [NHANES]    Frequency of Communication with Friends and Family: More than three times a week    Frequency of Social Gatherings with Friends and Family: Twice a week    Attends Religious Services: More than 4 times per year    Active Member of [employer] or Organizations: No    Attends Banker Meetings: Not on  file    Marital Status: Married  Catering manager Violence: Not on file   Review of Systems   Gen: Denies any fever, chills, fatigue, weight loss, lack of appetite.  CV: Denies chest pain, heart palpitations, peripheral edema, syncope.  Resp: Denies shortness of breath at rest or with exertion. Denies wheezing or cough.  GI: see HPI GU : Denies urinary burning, urinary frequency, urinary hesitancy MS: Denies joint pain, muscle weakness, cramps, or limitation of movement.  Derm: Denies rash, itching, dry skin Psych: Denies depression, anxiety, memory loss, and confusion Heme: Denies bruising, bleeding, and enlarged lymph nodes.  Physical Exam   BP 126/80 (BP Location: Right Arm, Patient Position: Sitting, Cuff Size: Normal)   Pulse 73   Temp 97.9 F (36.6 C) (Temporal)   Ht 5\' 3"  (1.6 m)   Wt 194 lb 6.4 oz (88.2 kg)   LMP 06/01/2015   SpO2 94%   BMI 34.44 kg/m   General:   Alert and oriented. Pleasant and cooperative. Well-nourished and well-developed.  Head:  Normocephalic and atraumatic. Eyes:  Without icterus, sclera clear and conjunctiva pink.  Ears:  Normal auditory acuity. Mouth:  No deformity or lesions, oral mucosa pink.  Lungs:  Clear to auscultation bilaterally. No wheezes, rales, or rhonchi. No distress.  Heart:  S1, S2 present without murmurs appreciated.  Abdomen:  +BS, soft, non-tender and non-distended. No HSM noted. No guarding or rebound. No masses appreciated.  Rectal:  Deferred  Msk:  Symmetrical without gross deformities.  Normal posture. Extremities:  Without edema. Neurologic:  Alert and  oriented x4;  grossly normal neurologically. Skin:  Intact without significant lesions or rashes. Psych:  Alert and cooperative. Normal mood and affect.   Assessment   Patricia Carr is a 67 y.o. 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   Positive Cologuard, family history of colon cancer: Family history of colon cancer in her mother.  Has had prior colonoscopies in the past and has had at least 2 negative Cologuard's prior to her most recent positive Cologuard.  Prior colonoscopies without any evidence of polyps.  She has no alarm symptoms at this time but given positive Cologuard we discussed reasoning behind needing to perform colonoscopy and also discussed that there are false positives and negatives.  She has not completed a colonoscopy since her gastric bypass given she has failed attempt at traditional prep.  We discussed that given she has good kidney function we will attempt a lower volume prep such as Clenpiq or Suprep given her history of gastric bypass.  We also discussed the need to ensure adequate hydration to ensure this works.  Will provide a small amount of Zofran to ensure she does not having significant nausea during prep.  GERD: Well-controlled off any medication.  Has very rare symptoms which is relieved with Tums.  PLAN   Proceed with colonoscopy with propofol by Dr. Jena Gauss in near future: the risks, benefits, and alternatives have been discussed with the patient in detail. The patient states understanding and desires to proceed. ASA 2 Hold Wegovy for 1 week.  Suprep or clenpiq given bypass history.  Small amount of zofran in case nausea occurs with prep Follow up as needed.     Brooke Bonito, MSN, FNP-BC, AGACNP-BC [facility]

## 2023-05-09 ENCOUNTER — Encounter: Payer: Self-pay | Admitting: *Deleted

## 2023-05-09 ENCOUNTER — Ambulatory Visit (INDEPENDENT_AMBULATORY_CARE_PROVIDER_SITE_OTHER): Payer: Managed Care, Other (non HMO) | Admitting: Gastroenterology

## 2023-05-09 ENCOUNTER — Other Ambulatory Visit: Payer: Self-pay | Admitting: *Deleted

## 2023-05-09 ENCOUNTER — Encounter: Payer: Self-pay | Admitting: Gastroenterology

## 2023-05-09 VITALS — BP 126/80 | HR 73 | Temp 97.9°F | Ht 63.0 in | Wt 194.4 lb

## 2023-05-09 DIAGNOSIS — R195 Other fecal abnormalities: Secondary | ICD-10-CM

## 2023-05-09 DIAGNOSIS — Z1211 Encounter for screening for malignant neoplasm of colon: Secondary | ICD-10-CM

## 2023-05-09 DIAGNOSIS — Z800 Family history of malignant neoplasm of digestive organs: Secondary | ICD-10-CM | POA: Diagnosis not present

## 2023-05-09 MED ORDER — ONDANSETRON HCL 4 MG PO TABS: 4.0000 mg | ORAL_TABLET | Freq: Three times a day (TID) | ORAL | 0 refills | Status: DC | PRN

## 2023-05-09 MED ORDER — CLENPIQ 10-3.5-12 MG-GM -GM/175ML PO SOLN: 1.0000 | ORAL | 0 refills | Status: DC

## 2023-05-09 NOTE — Patient Instructions (Signed)
We are scheduling you for colonoscopy in the near future with Dr. Jena Gauss.  As we discussed you will need to hold your Wegovy for at least 7 days (procedure on day 8 or after).   I provided you with a small amount of Zofran for you to keep on hand for your colonoscopy prep just in case you experience some nausea. We will attempt either Suprep or Clenpiq for you given your history of gastric bypass as these are lower volume.  You do need to ensure you stay very hydrated and drink plenty of fluids after release preps to avoid dehydration and to ensure that they work.  It was a pleasure to see you today. I want to create trusting relationships with patients. If you receive a survey regarding your visit,  I greatly appreciate you taking time to fill this out on paper or through your MyChart. I value your feedback.  Brooke Bonito, MSN, FNP-BC, AGACNP-BC [facility]

## 2023-05-10 ENCOUNTER — Encounter: Payer: Self-pay | Admitting: Gastroenterology

## 2023-05-12 ENCOUNTER — Other Ambulatory Visit: Payer: Self-pay | Admitting: Family Medicine

## 2023-05-12 DIAGNOSIS — I10 Essential (primary) hypertension: Secondary | ICD-10-CM

## 2023-05-16 ENCOUNTER — Other Ambulatory Visit (HOSPITAL_COMMUNITY)
Admission: RE | Admit: 2023-05-16 | Discharge: 2023-05-16 | Disposition: A | Discharge status: Home / Self Care | Payer: Medicare Other | Source: Ambulatory Visit | Attending: Internal Medicine | Admitting: Internal Medicine

## 2023-05-16 DIAGNOSIS — R195 Other fecal abnormalities: Secondary | ICD-10-CM | POA: Insufficient documentation

## 2023-05-16 DIAGNOSIS — Z800 Family history of malignant neoplasm of digestive organs: Secondary | ICD-10-CM | POA: Insufficient documentation

## 2023-05-16 LAB — BASIC METABOLIC PANEL
Anion gap: 9 (ref 5–15)
BUN: 13 mg/dL (ref 8–23)
CO2: 27 mmol/L (ref 22–32)
Calcium: 9.1 mg/dL (ref 8.9–10.3)
Chloride: 102 mmol/L (ref 98–111)
Creatinine, Ser: 0.67 mg/dL (ref 0.44–1.00)
GFR, Estimated: >60 mL/min (ref >60)
Glucose, Bld: 101 mg/dL — ABNORMAL HIGH (ref 70–99)
Potassium: 3.6 mmol/L (ref 3.5–5.1)
Sodium: 138 mmol/L (ref 135–145)

## 2023-05-25 ENCOUNTER — Other Ambulatory Visit: Payer: Self-pay

## 2023-05-25 ENCOUNTER — Encounter (HOSPITAL_COMMUNITY): Payer: Self-pay | Admitting: Internal Medicine

## 2023-05-25 ENCOUNTER — Ambulatory Visit (HOSPITAL_COMMUNITY): Payer: Medicare Other | Admitting: Anesthesiology

## 2023-05-25 ENCOUNTER — Ambulatory Visit (HOSPITAL_BASED_OUTPATIENT_CLINIC_OR_DEPARTMENT_OTHER): Payer: Medicare Other | Admitting: Anesthesiology

## 2023-05-25 ENCOUNTER — Ambulatory Visit (HOSPITAL_COMMUNITY)
Admission: RE | Admit: 2023-05-25 | Discharge: 2023-05-25 | Disposition: A | Discharge status: Home / Self Care | Payer: Medicare Other | Source: Ambulatory Visit | Attending: Internal Medicine | Admitting: Internal Medicine

## 2023-05-25 ENCOUNTER — Encounter (HOSPITAL_COMMUNITY)
Admission: RE | Disposition: A | Discharge status: Home / Self Care | Payer: Self-pay | Source: Ambulatory Visit | Attending: Internal Medicine

## 2023-05-25 DIAGNOSIS — F32A Depression, unspecified: Secondary | ICD-10-CM | POA: Insufficient documentation

## 2023-05-25 DIAGNOSIS — K635 Polyp of colon: Secondary | ICD-10-CM

## 2023-05-25 DIAGNOSIS — I10 Essential (primary) hypertension: Secondary | ICD-10-CM | POA: Diagnosis not present

## 2023-05-25 DIAGNOSIS — Z1211 Encounter for screening for malignant neoplasm of colon: Secondary | ICD-10-CM | POA: Diagnosis present

## 2023-05-25 DIAGNOSIS — R195 Other fecal abnormalities: Secondary | ICD-10-CM | POA: Insufficient documentation

## 2023-05-25 DIAGNOSIS — Z853 Personal history of malignant neoplasm of breast: Secondary | ICD-10-CM | POA: Insufficient documentation

## 2023-05-25 DIAGNOSIS — Z87891 Personal history of nicotine dependence: Secondary | ICD-10-CM

## 2023-05-25 DIAGNOSIS — Z9884 Bariatric surgery status: Secondary | ICD-10-CM | POA: Diagnosis not present

## 2023-05-25 DIAGNOSIS — Z7985 Long-term (current) use of injectable non-insulin antidiabetic drugs: Secondary | ICD-10-CM | POA: Diagnosis not present

## 2023-05-25 DIAGNOSIS — D125 Benign neoplasm of sigmoid colon: Secondary | ICD-10-CM

## 2023-05-25 DIAGNOSIS — K5730 Diverticulosis of large intestine without perforation or abscess without bleeding: Secondary | ICD-10-CM | POA: Insufficient documentation

## 2023-05-25 DIAGNOSIS — Z800 Family history of malignant neoplasm of digestive organs: Secondary | ICD-10-CM | POA: Diagnosis not present

## 2023-05-25 DIAGNOSIS — F419 Anxiety disorder, unspecified: Secondary | ICD-10-CM | POA: Insufficient documentation

## 2023-05-25 HISTORY — PX: BIOPSY: SHX5522

## 2023-05-25 HISTORY — PX: COLONOSCOPY WITH PROPOFOL: SHX5780

## 2023-05-25 SURGERY — COLONOSCOPY WITH PROPOFOL
Anesthesia: General

## 2023-05-25 MED ORDER — PHENYLEPHRINE 80 MCG/ML (10ML) SYRINGE FOR IV PUSH (FOR BLOOD PRESSURE SUPPORT)
PREFILLED_SYRINGE | INTRAVENOUS | Status: AC
  Filled 2023-05-25: qty 10

## 2023-05-25 MED ORDER — PROPOFOL 500 MG/50ML IV EMUL
INTRAVENOUS | Status: DC | PRN
  Administered 2023-05-25: 150 ug/kg/min via INTRAVENOUS

## 2023-05-25 MED ORDER — LIDOCAINE HCL (CARDIAC) PF 100 MG/5ML IV SOSY
PREFILLED_SYRINGE | INTRAVENOUS | Status: DC | PRN
  Administered 2023-05-25: 80 mg via INTRAVENOUS

## 2023-05-25 MED ORDER — LACTATED RINGERS IV SOLN: INTRAVENOUS | Status: DC | PRN

## 2023-05-25 MED ORDER — STERILE WATER FOR IRRIGATION IR SOLN
Status: DC | PRN
  Administered 2023-05-25: 240 mL

## 2023-05-25 MED ORDER — LACTATED RINGERS IV SOLN: INTRAVENOUS | Status: DC

## 2023-05-25 MED ORDER — PROPOFOL 10 MG/ML IV BOLUS
INTRAVENOUS | Status: DC | PRN
Start: 2023-05-25 — End: 2023-05-25
  Administered 2023-05-25: 40 mg via INTRAVENOUS
  Administered 2023-05-25: 100 mg via INTRAVENOUS

## 2023-05-25 NOTE — Op Note (Signed)
[facility] Patient Name: Patricia Carr Procedure Date: 05/25/2023 9:55 AM MRN: [id number] Date of Birth: Sep 02, 1956 Attending MD: Gennette Pac , MD, [id number] CSN: [id number] Age: 67 Admit Type: Outpatient Procedure:                Colonoscopy Indications:              Positive Cologuard test Providers:                Gennette Pac, MD, Edrick Kins, RN,                            Zena Amos Referring MD:              Medicines:                Propofol per Anesthesia Complications:            No immediate complications. Estimated Blood Loss:     Estimated blood loss was minimal. Procedure:                Pre-Anesthesia Assessment:                           - Prior to the procedure, a History and Physical                            was performed, and patient medications and                            allergies were reviewed. The patient's tolerance of                            previous anesthesia was also reviewed. The risks                            and benefits of the procedure and the sedation                            options and risks were discussed with the patient.                            All questions were answered, and informed consent                            was obtained. Prior Anticoagulants: The patient has                            taken no anticoagulant or antiplatelet agents. ASA                            Grade Assessment: II - A patient with mild systemic                            disease. After reviewing the risks and benefits,  the patient was deemed in satisfactory condition to                            undergo the procedure.                           After obtaining informed consent, the colonoscope                            was passed under direct vision. Throughout the                            procedure, the patient's blood pressure, pulse, and                            oxygen saturations were  monitored continuously. The                            [id number]) scope was introduced through the                            anus and advanced to the the cecum, identified by                            appendiceal orifice and ileocecal valve. The                            colonoscopy was performed without difficulty. The                            patient tolerated the procedure well. The quality                            of the bowel preparation was adequate. The                            ileocecal valve, appendiceal orifice, and rectum                            were photographed. Scope In: 10:13:15 AM Scope Out: 10:34:08 AM Scope Withdrawal Time: 0 hours 14 minutes 46 seconds  Total Procedure Duration: 0 hours 20 minutes 53 seconds  Findings:      The perianal and digital rectal examinations were normal.      Scattered medium-mouthed diverticula were found in the sigmoid colon and       descending colon. 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 The polyp was sessile.       The polyp was removed with a cold biopsy forceps. Resection and       retrieval were complete. Estimated blood loss was minimal.      The exam was otherwise without abnormality on direct and retroflexion       views. Impression:               -  Diverticulosis in the sigmoid colon and in the                            descending colon. Melanosis coli                           - One 3 mm polyp in the mid sigmoid colon, removed                            with a cold biopsy forceps. Resected and retrieved.                           - The examination was otherwise normal on direct                            and retroflexion views. Moderate Sedation:      Moderate (conscious) sedation was personally administered by an       anesthesia professional. The following parameters were monitored: oxygen       saturation, heart  rate, blood pressure, respiratory rate, EKG, adequacy       of pulmonary ventilation, and response to care. Recommendation:           - Patient has a contact number available for                            emergencies. The signs and symptoms of potential                            delayed complications were discussed with the                            patient. Return to normal activities tomorrow.                            Written discharge instructions were provided to the                            patient.                           - Resume previous diet.                           - Continue present medications.                           - Repeat colonoscopy date to be determined after                            pending pathology results are reviewed for                            surveillance.                           - Return to GI office (  date not yet determined). Procedure Code(s):        --- Professional ---                           [phone], Colonoscopy, flexible; with biopsy, single                            or multiple Diagnosis Code(s):        --- Professional ---                           D12.5, Benign neoplasm of sigmoid colon                           R19.5, Other fecal abnormalities                           K57.30, Diverticulosis of large intestine without                            perforation or abscess without bleeding CPT copyright 2022 [employer]. All rights reserved. The codes documented in this report are preliminary and upon coder review may  be revised to meet current compliance requirements. Gerrit Friends. Esther Bradstreet, MD Gennette Pac, MD 05/25/2023 10:42:09 AM This report has been signed electronically. Number of Addenda: 0

## 2023-05-25 NOTE — Transfer of Care (Signed)
Immediate Anesthesia Transfer of Care Note  Patient: Patricia Carr  Procedure(s) Performed: COLONOSCOPY WITH PROPOFOL BIOPSY  Patient Location: PACU  Anesthesia Type:General  Level of Consciousness: awake and patient cooperative  Airway & Oxygen Therapy: Patient Spontanous Breathing and Patient connected to nasal cannula oxygen  Post-op Assessment: Report given to RN and Post -op Vital signs reviewed and stable  Post vital signs: Reviewed and stable  Last Vitals:  Vitals Value Taken Time  BP 133/60 05/25/23 1039  Temp 36.4 C 05/25/23 1039  Pulse 59 05/25/23 1039  Resp 15 05/25/23 1039  SpO2 98 % 05/25/23 1039    Last Pain:  Vitals:   05/25/23 1039  TempSrc: Axillary  PainSc: 0-No pain      Patients Stated Pain Goal: 8 (05/25/23 0911)  Complications: No notable events documented.

## 2023-05-25 NOTE — Interval H&P Note (Signed)
History and Physical Interval Note:  05/25/2023 10:06 AM  Patricia Carr  has presented today for surgery, with the diagnosis of positive cologuard, fhx colon cancer.  The various methods of treatment have been discussed with the patient and family. After consideration of risks, benefits and other options for treatment, the patient has consented to  Procedure(s) with comments: COLONOSCOPY WITH PROPOFOL (N/A) - 10:15am, asa 2 as a surgical intervention.  The patient's history has been reviewed, patient examined, no change in status, stable for surgery.  I have reviewed the patient's chart and labs.  Questions were answered to the patient's satisfaction.     Eula Listen

## 2023-05-25 NOTE — Interval H&P Note (Signed)
History and Physical Interval Note:  05/25/2023 9:59 AM  Patricia Carr  has presented today for surgery, with the diagnosis of positive cologuard, fhx colon cancer.  The various methods of treatment have been discussed with the patient and family. After consideration of risks, benefits and other options for treatment, the patient has consented to  Procedure(s) with comments: COLONOSCOPY WITH PROPOFOL (N/A) - 10:15am, asa 2 as a surgical intervention.  The patient's history has been reviewed, patient examined, no change in status, stable for surgery.  I have reviewed the patient's chart and labs.  Questions were answered to the patient's satisfaction.     Diedre Maclellan    No change.  Wegovy held x 7 days.  Diagnostic colonoscopy per plan.The risks, benefits, limitations, alternatives and imponderables have been reviewed with the patient. Questions have been answered. All parties are agreeable.

## 2023-05-25 NOTE — Anesthesia Preprocedure Evaluation (Signed)
Anesthesia Evaluation  Patient identified by MRN, date of birth, ID band Patient awake    Reviewed: Allergy & Precautions, H&P , NPO status , Patient's Chart, lab work & pertinent test results, reviewed documented beta blocker date and time   Airway Mallampati: II  TM Distance: >3 FB Neck ROM: full    Dental no notable dental hx.    Pulmonary neg pulmonary ROS, sleep apnea , former smoker   Pulmonary exam normal breath sounds clear to auscultation       Cardiovascular Exercise Tolerance: Good hypertension, negative cardio ROS  Rhythm:regular Rate:Normal     Neuro/Psych  PSYCHIATRIC DISORDERS Anxiety Depression    negative neurological ROS  negative psych ROS   GI/Hepatic negative GI ROS, Neg liver ROS,GERD  ,,  Endo/Other  negative endocrine ROS    Renal/GU negative Renal ROS  negative genitourinary   Musculoskeletal   Abdominal   Peds  Hematology negative hematology ROS (+) Blood dyscrasia, anemia   Anesthesia Other Findings   Reproductive/Obstetrics negative OB ROS                             Anesthesia Physical Anesthesia Plan  ASA: 3  Anesthesia Plan: General   Post-op Pain Management:    Induction:   PONV Risk Score and Plan: Propofol infusion  Airway Management Planned:   Additional Equipment:   Intra-op Plan:   Post-operative Plan:   Informed Consent: I have reviewed the patients History and Physical, chart, labs and discussed the procedure including the risks, benefits and alternatives for the proposed anesthesia with the patient or authorized representative who has indicated his/her understanding and acceptance.     Dental Advisory Given  Plan Discussed with: CRNA  Anesthesia Plan Comments:        Anesthesia Quick Evaluation

## 2023-05-25 NOTE — Discharge Instructions (Signed)
  Colonoscopy Discharge Instructions  Read the instructions outlined below and refer to this sheet in the next few weeks. These discharge instructions provide you with general information on caring for yourself after you leave the hospital. Your doctor may also give you specific instructions. While your treatment has been planned according to the most current medical practices available, unavoidable complications occasionally occur. If you have any problems or questions after discharge, call Dr. Jena Gauss at [phone]. ACTIVITY You may resume your regular activity, but move at a slower pace for the next 24 hours.  Take frequent rest periods for the next 24 hours.  Walking will help get rid of the air and reduce the bloated feeling in your belly (abdomen).  No driving for 24 hours (because of the medicine (anesthesia) used during the test).   Do not sign any important legal documents or operate any machinery for 24 hours (because of the anesthesia used during the test).  NUTRITION Drink plenty of fluids.  You may resume your normal diet as instructed by your doctor.  Begin with a light meal and progress to your normal diet. Heavy or fried foods are harder to digest and may make you feel sick to your stomach (nauseated).  Avoid alcoholic beverages for 24 hours or as instructed.  MEDICATIONS You may resume your normal medications unless your doctor tells you otherwise.  WHAT YOU CAN EXPECT TODAY Some feelings of bloating in the abdomen.  Passage of more gas than usual.  Spotting of blood in your stool or on the toilet paper.  IF YOU HAD POLYPS REMOVED DURING THE COLONOSCOPY: No aspirin products for 7 days or as instructed.  No alcohol for 7 days or as instructed.  Eat a soft diet for the next 24 hours.  FINDING OUT THE RESULTS OF YOUR TEST Not all test results are available during your visit. If your test results are not back during the visit, make an appointment with your caregiver to find out the  results. Do not assume everything is normal if you have not heard from your caregiver or the medical facility. It is important for you to follow up on all of your test results.  SEEK IMMEDIATE MEDICAL ATTENTION IF: You have more than a spotting of blood in your stool.  Your belly is swollen (abdominal distention).  You are nauseated or vomiting.  You have a temperature over 101.  You have abdominal pain or discomfort that is severe or gets worse throughout the day.        1 small polyp removed from your colon  Colon polyp and diverticulosis information provided   further recommendations to follow pending review of pathology report   at patient request, I called Jimmy at [phone] -  rolled to voicemail.  Left a message.

## 2023-05-29 ENCOUNTER — Encounter (HOSPITAL_COMMUNITY): Payer: Self-pay | Admitting: Internal Medicine

## 2023-05-29 ENCOUNTER — Encounter: Payer: Self-pay | Admitting: Internal Medicine

## 2023-05-30 NOTE — Anesthesia Postprocedure Evaluation (Signed)
Anesthesia Post Note  Patient: Patricia Carr  Procedure(s) Performed: COLONOSCOPY WITH PROPOFOL BIOPSY  Patient location during evaluation: Phase II Anesthesia Type: General Level of consciousness: awake Pain management: pain level controlled Vital Signs Assessment: post-procedure vital signs reviewed and stable Respiratory status: spontaneous breathing and respiratory function stable Cardiovascular status: blood pressure returned to baseline and stable Postop Assessment: no headache and no apparent nausea or vomiting Anesthetic complications: no Comments: Late entry   No notable events documented.   Last Vitals:  Vitals:   05/25/23 0911 05/25/23 1039  BP: 119/64 133/60  Pulse: (!) 56 (!) 59  Resp: 16 15  Temp: 37.1 C 36.4 C  SpO2: 94% 98%    Last Pain:  Vitals:   05/25/23 1039  TempSrc: Axillary  PainSc: 0-No pain                 [name]

## 2023-06-10 ENCOUNTER — Other Ambulatory Visit: Payer: Self-pay | Admitting: Family Medicine

## 2023-06-10 DIAGNOSIS — E538 Deficiency of other specified B group vitamins: Secondary | ICD-10-CM

## 2023-06-29 ENCOUNTER — Telehealth (HOSPITAL_COMMUNITY): Payer: Medicare Other | Admitting: Psychiatry

## 2023-06-29 ENCOUNTER — Other Ambulatory Visit: Payer: Self-pay | Admitting: Family Medicine

## 2023-06-29 ENCOUNTER — Encounter (HOSPITAL_COMMUNITY): Payer: Self-pay | Admitting: Psychiatry

## 2023-06-29 DIAGNOSIS — F411 Generalized anxiety disorder: Secondary | ICD-10-CM | POA: Diagnosis not present

## 2023-06-29 DIAGNOSIS — M159 Polyosteoarthritis, unspecified: Secondary | ICD-10-CM

## 2023-06-29 DIAGNOSIS — E538 Deficiency of other specified B group vitamins: Secondary | ICD-10-CM

## 2023-06-29 DIAGNOSIS — F3341 Major depressive disorder, recurrent, in partial remission: Secondary | ICD-10-CM | POA: Diagnosis not present

## 2023-06-29 DIAGNOSIS — I10 Essential (primary) hypertension: Secondary | ICD-10-CM

## 2023-06-29 DIAGNOSIS — F5104 Psychophysiologic insomnia: Secondary | ICD-10-CM

## 2023-06-29 DIAGNOSIS — Z79899 Other long term (current) drug therapy: Secondary | ICD-10-CM

## 2023-06-29 DIAGNOSIS — M150 Primary generalized (osteo)arthritis: Secondary | ICD-10-CM

## 2023-06-29 MED ORDER — ARIPIPRAZOLE 2 MG PO TABS
2.0000 mg | ORAL_TABLET | Freq: Every day | ORAL | 0 refills | Status: DC
Start: 2023-06-29 — End: 2023-09-25

## 2023-06-29 MED ORDER — DULOXETINE HCL 30 MG PO CPEP
90.0000 mg | ORAL_CAPSULE | Freq: Every day | ORAL | 0 refills | Status: DC
Start: 2023-06-29 — End: 2023-10-02

## 2023-06-29 NOTE — Patient Instructions (Signed)
We did not make any medication changes today. Keep up the good work with boundary setting!
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
## 2023-07-19 DIAGNOSIS — N952 Postmenopausal atrophic vaginitis: Secondary | ICD-10-CM | POA: Diagnosis not present

## 2023-07-19 DIAGNOSIS — Z5181 Encounter for therapeutic drug level monitoring: Secondary | ICD-10-CM | POA: Diagnosis not present

## 2023-07-19 DIAGNOSIS — Z1231 Encounter for screening mammogram for malignant neoplasm of breast: Secondary | ICD-10-CM | POA: Diagnosis not present

## 2023-07-19 DIAGNOSIS — Z170 Estrogen receptor positive status [ER+]: Secondary | ICD-10-CM | POA: Diagnosis not present

## 2023-07-19 DIAGNOSIS — Z79811 Long term (current) use of aromatase inhibitors: Secondary | ICD-10-CM | POA: Diagnosis not present

## 2023-07-19 DIAGNOSIS — Z9189 Other specified personal risk factors, not elsewhere classified: Secondary | ICD-10-CM | POA: Diagnosis not present

## 2023-07-19 DIAGNOSIS — C50212 Malignant neoplasm of upper-inner quadrant of left female breast: Secondary | ICD-10-CM | POA: Diagnosis not present

## 2023-07-21 ENCOUNTER — Telehealth: Payer: Self-pay | Admitting: Family Medicine

## 2023-07-21 NOTE — Telephone Encounter (Signed)
Please add future lab order. Pt coming in on 9/24 to do lab work before her appt on 9/25.

## 2023-07-25 ENCOUNTER — Other Ambulatory Visit: Payer: Managed Care, Other (non HMO)

## 2023-07-25 NOTE — Telephone Encounter (Signed)
She isn't due for labs with me.  We did everything in June. What was she looking to have collected?

## 2023-07-26 ENCOUNTER — Ambulatory Visit (INDEPENDENT_AMBULATORY_CARE_PROVIDER_SITE_OTHER): Payer: Medicare Other | Admitting: Family Medicine

## 2023-07-26 ENCOUNTER — Encounter: Payer: Self-pay | Admitting: Family Medicine

## 2023-07-26 VITALS — BP 126/78 | HR 74 | Temp 98.8°F | Ht 63.0 in | Wt 193.0 lb

## 2023-07-26 DIAGNOSIS — Z6835 Body mass index (BMI) 35.0-35.9, adult: Secondary | ICD-10-CM

## 2023-07-26 DIAGNOSIS — I10 Essential (primary) hypertension: Secondary | ICD-10-CM | POA: Diagnosis not present

## 2023-07-26 DIAGNOSIS — E782 Mixed hyperlipidemia: Secondary | ICD-10-CM | POA: Diagnosis not present

## 2023-07-26 MED ORDER — LISINOPRIL-HYDROCHLOROTHIAZIDE 20-25 MG PO TABS: 1.0000 | ORAL_TABLET | Freq: Every day | ORAL | 3 refills | Status: DC

## 2023-07-26 NOTE — Progress Notes (Signed)
Subjective: CC: Follow-up weight PCP: Patricia Carr Patricia Carr is a 67 y.o. female presenting to clinic today for:  1.  Obesity with hypertension and hyperlipidemia Patient reports compliance with semaglutide 2.4 mg weekly.  She is tolerating this without difficulty.  She gets this from a compounding [facility] in [place] [facility].  She reports no abdominal pain, nausea, vomiting or issues with bowel habits.  She is compliant with her blood pressure and cholesterol medications.   ROS: Per HPI  Allergies  Allergen Reactions   Iodine     REACTION: rash   Iodinated Contrast Media Rash        Past Medical History:  Diagnosis Date   Anemia    Anxiety    Back pain    Bronchitis    Depression    GERD (gastroesophageal reflux disease)    Hypertension    Joint pain    Knee pain    Malignant neoplasm of upper-inner quadrant of left breast in female, estrogen receptor positive (HCC) 2021   s/p lumpectomy and 16 rounds radiation   Osteoarthritis    Sinus complaint    Sleep apnea    Squamous cell cancer of skin of nose 2021   [facility]erm: Patricia Carr easily    Vitamin B 12 deficiency    Wears glasses     Current Outpatient Medications:    ARIPiprazole (ABILIFY) 2 MG tablet, Take 1 tablet (2 mg total) by mouth at bedtime., Disp: 90 tablet, Rfl: 0   cyanocobalamin (VITAMIN B12) 1000 MCG/ML injection, INJECT 1 ML (1,000 MCG) INTRAMUSCULARLY EVERY 30 DAYS, Disp: 3 mL, Rfl: 0   DULoxetine (CYMBALTA) 30 MG capsule, Take 3 capsules (90 mg total) by mouth daily., Disp: 270 capsule, Rfl: 0   estradiol (ESTRACE) 0.1 MG/GM vaginal cream, Place 1 Applicatorful vaginally at bedtime., Disp: , Rfl:    exemestane (AROMASIN) 25 MG tablet, Take 25 mg by mouth daily after breakfast., Disp: , Rfl:    metoprolol succinate (TOPROL-XL) 50 MG 24 hr tablet, TAKE 2 TABLETS (100 MG TOTAL) BY MOUTH AT BEDTIME. TAKE WITH OR IMMEDIATELY  FOLLOWING A MEAL., Disp: 180 tablet, Rfl: 0   NONFORMULARY OR COMPOUNDED ITEM, Semaglutide w/ b6 2.5mg /mL. Inject 10 units every 7 days x4 weeks, then 20 u every 7 d x4 weeks, then 40 u every 7 days x4 weeks, then 80 u every 7 days. Dispense QS for 1 month w/ 4 RFs, Disp: 2 each, Rfl: 4   rosuvastatin (CRESTOR) 5 MG tablet, Take 1 tablet (5 mg total) by mouth daily., Disp: 90 tablet, Rfl: 3   Semaglutide-Weight Management 2.4 MG/0.75ML SOAJ, Inject 2.4 mg into the skin every Tuesday., Disp: , Rfl:    Sod Picosulfate-Mag Ox-Cit Acd (CLENPIQ) 10-3.5-12 MG-GM -GM/175ML SOLN, Take 1 kit by mouth as directed., Disp: 350 mL, Rfl: 0   Syringe/Needle, Disp, (SYRINGE 3CC/25GX1") 25G X 1" 3 ML MISC, 1 Units by Does not apply route once a week., Disp: 4 each, Rfl: 12   Vitamin D, Ergocalciferol, (DRISDOL) 1.25 MG (50000 UNIT) CAPS capsule, TAKE 1 CAPSULE (50,000 UNITS TOTAL) BY MOUTH EVERY 7 (SEVEN) DAYS (Patient taking differently: Take 50,000 Units by mouth every Sunday.), Disp: 12 capsule, Rfl: 3   lisinopril-hydrochlorothiazide (ZESTORETIC) 20-25 MG tablet, Take 1 tablet by mouth daily., Disp: 90 tablet, Rfl: 3 Social History   Socioeconomic History   Marital status: Married    Spouse name: Patricia Carr  Number of children: Not on file   Years of education: Not on file   Highest education level: Associate degree: academic program  Occupational History   Occupation: Librarian, academic  Tobacco Use   Smoking status: Former    Types: Cigarettes   Smokeless tobacco: Never  Vaping Use   Vaping status: Never Used  Substance and Sexual Activity   Alcohol use: Yes    Comment: couple of glasses of wine per week   Drug use: Never   Sexual activity: Yes  Other Topics Concern   Not on file  Social History Narrative   Not on file   Social Determinants of Health   Financial Resource Strain: Low Risk  (04/03/2023)   Overall Financial Resource Strain (CARDIA)    Difficulty of Paying Living Expenses: Not  hard at all  Food Insecurity: No Food Insecurity (04/03/2023)   Hunger Vital Sign    Worried About Running Out of Food in the Last Year: Never true    Ran Out of Food in the Last Year: Never true  Transportation Needs: No Transportation Needs (04/03/2023)   PRAPARE - Administrator, Civil Service (Medical): No    Lack of Transportation (Non-Medical): No  Physical Activity: Unknown (04/03/2023)   Exercise Vital Sign    Days of Exercise per Week: 0 days    Minutes of Exercise per Session: Not on file  Recent Concern: Physical Activity - Inactive (03/06/2023)   Received from [facility], [facility]   Exercise Vital Sign    Days of Exercise per Week: 0 days    Minutes of Exercise per Session: 0 min  Stress: No Stress Concern Present (04/03/2023)   [employer] of Occupational Health - Occupational Stress Questionnaire    Feeling of Stress : Only a little  Social Connections: Moderately Integrated (04/03/2023)   Social Connection and Isolation Panel [NHANES]    Frequency of Communication with Friends and Family: More than three times a week    Frequency of Social Gatherings with Friends and Family: Twice a week    Attends Religious Services: More than 4 times per year    Active Member of [employer] or Organizations: No    Attends Engineer, structural: Not on file    Marital Status: Married  Catering manager Violence: Not At Risk (03/06/2023)   Received from [employer], [facility]   HITS    Over the last 12 months how often did your partner physically hurt you?: 1    Over the last 12 months how often did your partner insult you or talk down to you?: 1    Over the last 12 months how often did your partner threaten you with physical harm?: 1    Over the last 12 months how often did your partner scream or curse at you?: 1   Family History  Problem Relation Age of Onset   Hyperlipidemia Mother    Colon cancer Mother 38   Diabetes Mother    Depression Mother     Anxiety disorder Mother    Breast cancer Mother 34 - 98   Heart disease Father    Hypertension Father    Sudden death Father 38   Sleep apnea Father    Hyperlipidemia Sister    Colon polyps Sister    ALS Sister 47   Colon cancer Other     Objective: Office vital signs reviewed. BP 126/78   Pulse 74   Temp 98.8 F (37.1  C)   Ht 5\' 3"  (1.6 m)   Wt 193 lb (87.5 kg)   LMP 06/01/2015   SpO2 93%   BMI 34.19 kg/m   Physical Examination:  General: Awake, alert, well nourished, No acute distress HEENT: Sclera white.  Moist mucus membranes Cardio: regular rate and rhythm, S1S2 heard, no murmurs appreciated Pulm: clear to auscultation bilaterally, no wheezes, rhonchi or rales; normal work of breathing on room air    Assessment/ Plan: 67 y.o. female   Class 2 severe obesity due to excess calories with serious comorbidity and body mass index (BMI) of 35.0 to 35.9 in adult [facility])  Mixed hyperlipidemia  Essential hypertension  Doing well with compounded GLP.  Glad to [facility] to [facility] once she is in need of refills  No need for hypertension or hyperlipidemia med adjustment at this time.  Blood pressure well-controlled.  Will plan for fasting labs at her April visit for Medicare wellness   Louetta Hollingshead Hulen Skains, [facility] [phone]

## 2023-07-28 ENCOUNTER — Ambulatory Visit: Payer: Managed Care, Other (non HMO) | Admitting: Family Medicine

## 2023-08-02 DIAGNOSIS — K080 Exfoliation of teeth due to systemic causes: Secondary | ICD-10-CM | POA: Diagnosis not present

## 2023-08-09 ENCOUNTER — Encounter: Payer: Self-pay | Admitting: Family Medicine

## 2023-09-24 ENCOUNTER — Other Ambulatory Visit (HOSPITAL_COMMUNITY): Payer: Self-pay | Admitting: Psychiatry

## 2023-09-24 DIAGNOSIS — F411 Generalized anxiety disorder: Secondary | ICD-10-CM

## 2023-09-24 DIAGNOSIS — F5104 Psychophysiologic insomnia: Secondary | ICD-10-CM

## 2023-09-24 DIAGNOSIS — F3341 Major depressive disorder, recurrent, in partial remission: Secondary | ICD-10-CM

## 2023-10-02 ENCOUNTER — Encounter (HOSPITAL_COMMUNITY): Payer: Self-pay | Admitting: Psychiatry

## 2023-10-02 ENCOUNTER — Telehealth (HOSPITAL_COMMUNITY): Payer: Medicare Other | Admitting: Psychiatry

## 2023-10-02 DIAGNOSIS — M150 Primary generalized (osteo)arthritis: Secondary | ICD-10-CM | POA: Diagnosis not present

## 2023-10-02 DIAGNOSIS — F3342 Major depressive disorder, recurrent, in full remission: Secondary | ICD-10-CM

## 2023-10-02 DIAGNOSIS — F411 Generalized anxiety disorder: Secondary | ICD-10-CM

## 2023-10-02 DIAGNOSIS — F5104 Psychophysiologic insomnia: Secondary | ICD-10-CM | POA: Diagnosis not present

## 2023-10-02 DIAGNOSIS — G4733 Obstructive sleep apnea (adult) (pediatric): Secondary | ICD-10-CM

## 2023-10-02 MED ORDER — DULOXETINE HCL 30 MG PO CPEP
90.0000 mg | ORAL_CAPSULE | Freq: Every day | ORAL | 0 refills | Status: DC
Start: 2023-10-02 — End: 2024-03-16

## 2023-10-02 MED ORDER — ARIPIPRAZOLE 2 MG PO TABS
2.0000 mg | ORAL_TABLET | Freq: Every day | ORAL | 0 refills | Status: DC
Start: 2023-10-02 — End: 2024-01-01

## 2023-10-02 NOTE — Patient Instructions (Signed)
We did not make any medication changes today.  Keep up the good work with healthy boundary setting.  I would still recommend getting a nutrition referral and checking a vitamin D, B12, folate, iron panel when you see your PCP next.
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
## 2023-10-27 ENCOUNTER — Telehealth: Payer: Self-pay | Admitting: Pharmacist

## 2023-10-27 NOTE — Telephone Encounter (Signed)
   This patient is appearing on a report for being at risk of failing the adherence measure for hypertension (ACEi/ARB) medications this calendar year.   Medication: lisinopril/hctz Last fill date: 10/15/23 for 90 day supply  Insurance report was not up to date. No action needed at this time.   Kieth Brightly, PharmD, BCACP, CPP Clinical Pharmacist, [facility]

## 2023-11-05 ENCOUNTER — Other Ambulatory Visit: Payer: Self-pay | Admitting: Family Medicine

## 2023-11-05 DIAGNOSIS — I10 Essential (primary) hypertension: Secondary | ICD-10-CM

## 2023-11-16 DIAGNOSIS — G4733 Obstructive sleep apnea (adult) (pediatric): Secondary | ICD-10-CM | POA: Diagnosis not present

## 2023-12-25 DIAGNOSIS — H16142 Punctate keratitis, left eye: Secondary | ICD-10-CM | POA: Diagnosis not present

## 2024-01-01 ENCOUNTER — Encounter (HOSPITAL_COMMUNITY): Payer: Self-pay | Admitting: Psychiatry

## 2024-01-01 ENCOUNTER — Telehealth (INDEPENDENT_AMBULATORY_CARE_PROVIDER_SITE_OTHER): Payer: Medicare Other | Admitting: Psychiatry

## 2024-01-01 DIAGNOSIS — F411 Generalized anxiety disorder: Secondary | ICD-10-CM

## 2024-01-01 DIAGNOSIS — F3342 Major depressive disorder, recurrent, in full remission: Secondary | ICD-10-CM | POA: Diagnosis not present

## 2024-01-01 DIAGNOSIS — Z8659 Personal history of other mental and behavioral disorders: Secondary | ICD-10-CM

## 2024-01-01 DIAGNOSIS — E538 Deficiency of other specified B group vitamins: Secondary | ICD-10-CM

## 2024-01-01 DIAGNOSIS — F5104 Psychophysiologic insomnia: Secondary | ICD-10-CM | POA: Diagnosis not present

## 2024-01-01 DIAGNOSIS — M150 Primary generalized (osteo)arthritis: Secondary | ICD-10-CM | POA: Diagnosis not present

## 2024-01-01 DIAGNOSIS — E559 Vitamin D deficiency, unspecified: Secondary | ICD-10-CM

## 2024-01-01 MED ORDER — ARIPIPRAZOLE 2 MG PO TABS
1.0000 mg | ORAL_TABLET | Freq: Every day | ORAL | Status: DC
Start: 2024-01-01 — End: 2024-02-15
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
## 2024-01-01 NOTE — Patient Instructions (Signed)
 We decreased the Abilify (aripiprazole) to 1 mg once daily today.  After taking this for 1 month you can discontinue.

## 2024-01-16 DIAGNOSIS — Z780 Asymptomatic menopausal state: Secondary | ICD-10-CM | POA: Diagnosis not present

## 2024-01-16 DIAGNOSIS — C50212 Malignant neoplasm of upper-inner quadrant of left female breast: Secondary | ICD-10-CM | POA: Diagnosis not present

## 2024-01-16 DIAGNOSIS — Z5181 Encounter for therapeutic drug level monitoring: Secondary | ICD-10-CM | POA: Diagnosis not present

## 2024-01-16 DIAGNOSIS — Z170 Estrogen receptor positive status [ER+]: Secondary | ICD-10-CM | POA: Diagnosis not present

## 2024-01-16 DIAGNOSIS — Z79811 Long term (current) use of aromatase inhibitors: Secondary | ICD-10-CM | POA: Diagnosis not present

## 2024-01-16 DIAGNOSIS — N952 Postmenopausal atrophic vaginitis: Secondary | ICD-10-CM | POA: Diagnosis not present

## 2024-01-16 DIAGNOSIS — Z9189 Other specified personal risk factors, not elsewhere classified: Secondary | ICD-10-CM | POA: Diagnosis not present

## 2024-01-16 DIAGNOSIS — Z1231 Encounter for screening mammogram for malignant neoplasm of breast: Secondary | ICD-10-CM | POA: Diagnosis not present

## 2024-01-30 ENCOUNTER — Encounter: Payer: Medicare Other | Admitting: Family Medicine

## 2024-02-04 ENCOUNTER — Other Ambulatory Visit: Payer: Self-pay | Admitting: Family Medicine

## 2024-02-04 DIAGNOSIS — E538 Deficiency of other specified B group vitamins: Secondary | ICD-10-CM

## 2024-02-05 DIAGNOSIS — K080 Exfoliation of teeth due to systemic causes: Secondary | ICD-10-CM | POA: Diagnosis not present

## 2024-02-12 ENCOUNTER — Telehealth (HOSPITAL_COMMUNITY): Admitting: Psychiatry

## 2024-02-15 ENCOUNTER — Encounter (HOSPITAL_COMMUNITY): Payer: Self-pay | Admitting: Psychiatry

## 2024-02-15 ENCOUNTER — Telehealth (HOSPITAL_COMMUNITY): Admitting: Psychiatry

## 2024-02-15 DIAGNOSIS — F5104 Psychophysiologic insomnia: Secondary | ICD-10-CM | POA: Diagnosis not present

## 2024-02-15 DIAGNOSIS — F411 Generalized anxiety disorder: Secondary | ICD-10-CM

## 2024-02-15 DIAGNOSIS — E559 Vitamin D deficiency, unspecified: Secondary | ICD-10-CM

## 2024-02-15 DIAGNOSIS — E538 Deficiency of other specified B group vitamins: Secondary | ICD-10-CM

## 2024-02-15 DIAGNOSIS — Z8659 Personal history of other mental and behavioral disorders: Secondary | ICD-10-CM

## 2024-02-15 DIAGNOSIS — F3342 Major depressive disorder, recurrent, in full remission: Secondary | ICD-10-CM | POA: Diagnosis not present
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
## 2024-02-15 NOTE — Patient Instructions (Signed)
 We will plan to decrease Cymbalta in 2 weeks to the 60 mg (2 of the 30 mg capsules) once daily.

## 2024-02-22 ENCOUNTER — Other Ambulatory Visit: Payer: Self-pay | Admitting: Family Medicine

## 2024-02-22 DIAGNOSIS — I10 Essential (primary) hypertension: Secondary | ICD-10-CM

## 2024-03-05 DIAGNOSIS — H5213 Myopia, bilateral: Secondary | ICD-10-CM | POA: Diagnosis not present

## 2024-03-12 ENCOUNTER — Telehealth (HOSPITAL_COMMUNITY): Admitting: Psychiatry

## 2024-03-15 ENCOUNTER — Other Ambulatory Visit (HOSPITAL_COMMUNITY): Payer: Self-pay | Admitting: Psychiatry

## 2024-03-15 DIAGNOSIS — F411 Generalized anxiety disorder: Secondary | ICD-10-CM

## 2024-03-15 DIAGNOSIS — M150 Primary generalized (osteo)arthritis: Secondary | ICD-10-CM

## 2024-03-22 ENCOUNTER — Other Ambulatory Visit: Payer: Self-pay | Admitting: Family Medicine

## 2024-03-22 DIAGNOSIS — E559 Vitamin D deficiency, unspecified: Secondary | ICD-10-CM

## 2024-03-26 ENCOUNTER — Other Ambulatory Visit: Payer: Self-pay | Admitting: Family Medicine

## 2024-03-26 ENCOUNTER — Encounter: Payer: Self-pay | Admitting: Family Medicine

## 2024-03-26 DIAGNOSIS — I10 Essential (primary) hypertension: Secondary | ICD-10-CM

## 2024-03-26 NOTE — Telephone Encounter (Signed)
 Refill denied, 30 day supply was given 02/22/24. Please schedule with PCP for further refills.

## 2024-03-27 ENCOUNTER — Encounter: Payer: Self-pay | Admitting: Family Medicine

## 2024-03-27 ENCOUNTER — Other Ambulatory Visit: Payer: Self-pay | Admitting: Family Medicine

## 2024-03-27 DIAGNOSIS — E782 Mixed hyperlipidemia: Secondary | ICD-10-CM

## 2024-03-27 DIAGNOSIS — I10 Essential (primary) hypertension: Secondary | ICD-10-CM

## 2024-03-27 MED ORDER — METOPROLOL SUCCINATE ER 50 MG PO TB24
100.0000 mg | ORAL_TABLET | Freq: Every day | ORAL | 0 refills | Status: DC
Start: 2024-03-27 — End: 2024-04-22

## 2024-03-27 NOTE — Telephone Encounter (Signed)
 Letter sent.

## 2024-04-11 DIAGNOSIS — C50212 Malignant neoplasm of upper-inner quadrant of left female breast: Secondary | ICD-10-CM | POA: Diagnosis not present

## 2024-04-11 DIAGNOSIS — Z170 Estrogen receptor positive status [ER+]: Secondary | ICD-10-CM | POA: Diagnosis not present

## 2024-04-11 DIAGNOSIS — Z1231 Encounter for screening mammogram for malignant neoplasm of breast: Secondary | ICD-10-CM | POA: Diagnosis not present

## 2024-04-11 DIAGNOSIS — R92323 Mammographic fibroglandular density, bilateral breasts: Secondary | ICD-10-CM | POA: Diagnosis not present

## 2024-04-22 ENCOUNTER — Telehealth: Payer: Self-pay | Admitting: Family Medicine

## 2024-04-22 DIAGNOSIS — Z9884 Bariatric surgery status: Secondary | ICD-10-CM

## 2024-04-22 DIAGNOSIS — R195 Other fecal abnormalities: Secondary | ICD-10-CM

## 2024-04-22 DIAGNOSIS — Z01419 Encounter for gynecological examination (general) (routine) without abnormal findings: Secondary | ICD-10-CM

## 2024-04-22 DIAGNOSIS — I10 Essential (primary) hypertension: Secondary | ICD-10-CM

## 2024-04-22 DIAGNOSIS — E782 Mixed hyperlipidemia: Secondary | ICD-10-CM

## 2024-04-22 MED ORDER — ROSUVASTATIN CALCIUM 5 MG PO TABS
5.0000 mg | ORAL_TABLET | Freq: Every day | ORAL | 0 refills | Status: DC
Start: 2024-04-22 — End: 2024-05-22

## 2024-04-22 MED ORDER — METOPROLOL SUCCINATE ER 50 MG PO TB24
100.0000 mg | ORAL_TABLET | Freq: Every day | ORAL | 0 refills | Status: DC
Start: 2024-04-22 — End: 2024-05-22

## 2024-04-22 NOTE — Telephone Encounter (Signed)
 Copied from CRM [id number]. Topic: General - Other >> Apr 22, 2024 10:08 AM Zebedee SAUNDERS wrote: Reason for CRM: Pt called wanting to schedule physical, which pt has not had since 10/01/2021 according to records. She has been trying to schedule a physical but it schedule as annual wellness. Also, the pt's Annual wellness visits keep getting cancelled. Please call pt to schedule physical and annual wellness visit [phone].

## 2024-04-22 NOTE — Telephone Encounter (Signed)
 Spoke with pt. CPE no pap, Welcome to [facility] and lab appt made. One month refill of Metoprolol  and Crestor  sent to [facility] in [facility]. All other meds from Dr. KANDICE have refills on them.

## 2024-04-23 ENCOUNTER — Other Ambulatory Visit

## 2024-04-23 ENCOUNTER — Ambulatory Visit

## 2024-05-15 ENCOUNTER — Other Ambulatory Visit

## 2024-05-15 DIAGNOSIS — E782 Mixed hyperlipidemia: Secondary | ICD-10-CM | POA: Diagnosis not present

## 2024-05-15 DIAGNOSIS — Z01419 Encounter for gynecological examination (general) (routine) without abnormal findings: Secondary | ICD-10-CM | POA: Diagnosis not present

## 2024-05-15 DIAGNOSIS — R195 Other fecal abnormalities: Secondary | ICD-10-CM

## 2024-05-15 DIAGNOSIS — I10 Essential (primary) hypertension: Secondary | ICD-10-CM | POA: Diagnosis not present

## 2024-05-15 DIAGNOSIS — Z9884 Bariatric surgery status: Secondary | ICD-10-CM

## 2024-05-15 LAB — HEMOGLOBIN A1C
Est. average glucose Bld gHb Est-mCnc: 97 mg/dL
Hgb A1c MFr Bld: 5 % (ref 4.8–5.6)

## 2024-05-15 LAB — LIPID PANEL

## 2024-05-16 ENCOUNTER — Ambulatory Visit: Payer: Self-pay | Admitting: Family Medicine

## 2024-05-16 LAB — VITAMIN D 25 HYDROXY (VIT D DEFICIENCY, FRACTURES): Vit D, 25-Hydroxy: 92.5 ng/mL (ref 30.0–100.0)

## 2024-05-16 LAB — CBC WITH DIFFERENTIAL/PLATELET
Basophils Absolute: 0 x10E3/uL (ref 0.0–0.2)
Basos: 1 %
EOS (ABSOLUTE): 0.1 x10E3/uL (ref 0.0–0.4)
Eos: 2 %
Hematocrit: 44.4 % (ref 34.0–46.6)
Hemoglobin: 15.2 g/dL (ref 11.1–15.9)
Immature Grans (Abs): 0 x10E3/uL (ref 0.0–0.1)
Immature Granulocytes: 0 %
Lymphocytes Absolute: 1.1 x10E3/uL (ref 0.7–3.1)
Lymphs: 19 %
MCH: 33.9 pg — ABNORMAL HIGH (ref 26.6–33.0)
MCHC: 34.2 g/dL (ref 31.5–35.7)
MCV: 99 fL — ABNORMAL HIGH (ref 79–97)
Monocytes Absolute: 0.4 x10E3/uL (ref 0.1–0.9)
Monocytes: 7 %
Neutrophils Absolute: 4.1 x10E3/uL (ref 1.4–7.0)
Neutrophils: 71 %
Platelets: 279 x10E3/uL (ref 150–450)
RBC: 4.49 x10E6/uL (ref 3.77–5.28)
RDW: 12.9 % (ref 11.7–15.4)
WBC: 5.7 x10E3/uL (ref 3.4–10.8)

## 2024-05-16 LAB — CMP14+EGFR
ALT: 10 IU/L (ref 0–32)
AST: 17 IU/L (ref 0–40)
Albumin: 4.2 g/dL (ref 3.9–4.9)
Alkaline Phosphatase: 94 IU/L (ref 44–121)
BUN/Creatinine Ratio: 22 (ref 12–28)
BUN: 17 mg/dL (ref 8–27)
Bilirubin Total: 0.8 mg/dL (ref 0.0–1.2)
CO2: 27 mmol/L (ref 20–29)
Calcium: 10 mg/dL (ref 8.7–10.3)
Chloride: 96 mmol/L (ref 96–106)
Creatinine, Ser: 0.79 mg/dL (ref 0.57–1.00)
Globulin, Total: 2.2 g/dL (ref 1.5–4.5)
Glucose: 105 mg/dL — ABNORMAL HIGH (ref 70–99)
Potassium: 3.3 mmol/L — AB (ref 3.5–5.2)
Sodium: 138 mmol/L (ref 134–144)
Total Protein: 6.4 g/dL (ref 6.0–8.5)
eGFR: 82 mL/min/1.73 (ref >59)

## 2024-05-16 LAB — LIPID PANEL
Cholesterol, Total: 135 mg/dL (ref 100–199)
HDL: 51 mg/dL (ref >39)
LDL CALC COMMENT:: 2.6 ratio (ref 0.0–4.4)
LDL Chol Calc (NIH): 56 mg/dL (ref 0–99)
Triglycerides: 168 mg/dL — AB (ref 0–149)
VLDL Cholesterol Cal: 28 mg/dL (ref 5–40)

## 2024-05-16 LAB — TSH: TSH: 2.46 u[IU]/mL (ref 0.450–4.500)

## 2024-05-22 ENCOUNTER — Telehealth: Payer: Self-pay | Admitting: Family Medicine

## 2024-05-22 ENCOUNTER — Encounter: Payer: Self-pay | Admitting: Family Medicine

## 2024-05-22 ENCOUNTER — Other Ambulatory Visit: Payer: Self-pay | Admitting: Family Medicine

## 2024-05-22 ENCOUNTER — Ambulatory Visit: Admitting: Family Medicine

## 2024-05-22 VITALS — BP 111/63 | HR 80 | Temp 98.7°F | Ht 63.0 in | Wt 176.0 lb

## 2024-05-22 DIAGNOSIS — E782 Mixed hyperlipidemia: Secondary | ICD-10-CM

## 2024-05-22 DIAGNOSIS — Z0000 Encounter for general adult medical examination without abnormal findings: Secondary | ICD-10-CM

## 2024-05-22 DIAGNOSIS — E66811 Obesity, class 1: Secondary | ICD-10-CM

## 2024-05-22 DIAGNOSIS — Z170 Estrogen receptor positive status [ER+]: Secondary | ICD-10-CM

## 2024-05-22 DIAGNOSIS — Z0001 Encounter for general adult medical examination with abnormal findings: Secondary | ICD-10-CM

## 2024-05-22 DIAGNOSIS — I10 Essential (primary) hypertension: Secondary | ICD-10-CM

## 2024-05-22 DIAGNOSIS — Z853 Personal history of malignant neoplasm of breast: Secondary | ICD-10-CM

## 2024-05-22 DIAGNOSIS — F411 Generalized anxiety disorder: Secondary | ICD-10-CM

## 2024-05-22 DIAGNOSIS — E538 Deficiency of other specified B group vitamins: Secondary | ICD-10-CM

## 2024-05-22 DIAGNOSIS — Z9884 Bariatric surgery status: Secondary | ICD-10-CM

## 2024-05-22 DIAGNOSIS — E559 Vitamin D deficiency, unspecified: Secondary | ICD-10-CM

## 2024-05-22 DIAGNOSIS — M85852 Other specified disorders of bone density and structure, left thigh: Secondary | ICD-10-CM

## 2024-05-22 MED ORDER — DULOXETINE HCL 30 MG PO CPEP: 90.0000 mg | ORAL_CAPSULE | Freq: Every day | ORAL | 4 refills | Status: AC

## 2024-05-22 MED ORDER — CYANOCOBALAMIN 1000 MCG/ML IJ SOLN: INTRAMUSCULAR | 5 refills | Status: AC

## 2024-05-22 MED ORDER — LISINOPRIL-HYDROCHLOROTHIAZIDE 20-25 MG PO TABS: 1.0000 | ORAL_TABLET | Freq: Every day | ORAL | 4 refills | Status: AC

## 2024-05-22 MED ORDER — ROSUVASTATIN CALCIUM 5 MG PO TABS: 5.0000 mg | ORAL_TABLET | Freq: Every day | ORAL | 4 refills | Status: AC

## 2024-05-22 MED ORDER — VITAMIN D (ERGOCALCIFEROL) 1.25 MG (50000 UNIT) PO CAPS: 50000.0000 [IU] | ORAL_CAPSULE | ORAL | 4 refills | Status: AC

## 2024-05-22 MED ORDER — METOPROLOL SUCCINATE ER 50 MG PO TB24: 100.0000 mg | ORAL_TABLET | Freq: Every day | ORAL | 4 refills | Status: AC

## 2024-05-22 NOTE — Progress Notes (Signed)
 Patricia Carr is a 68 y.o. female presents to office today for annual physical exam examination.    Concerns today include: 1.  She does report that she discontinued her Aromasin due to hot flashes, fatigue and polyarthralgia.  She has not discussed this with her oncologist, whom she will see next month.  She reports improvement in all the symptoms after discontinuation.  She continues to use compounded semaglutide through [facility] and has been successful with weight loss with this.  She occasionally has nausea with this but overall tolerating medication without difficulty.  Compliant with antihypertensives, statin.  Reports no chest pain, shortness of breath, change in exercise tolerance.  She has had a little bit of balance issues but wonders if this is because she is not doing enough strength training.  She is compliant with her vitamin supplementation given history of gastric bypass and deficiency  Mood has been well-controlled with Cymbalta  90 mg.  Her psychiatrist has left our system  Occupation: Retired, Marital status: Married, Substance use: None Health Maintenance Due  Topic Date Due   Medicare Annual Wellness (AWV)  Never done   Refills needed today: All  Immunization History  Administered Date(s) Administered   Fluad Quad(high Dose 65+) 06/29/2023   Influenza Inj Mdck Quad Pf 08/01/2017   Influenza Nasal 08/02/2016, 08/01/2017, 08/08/2018   Influenza Split 07/13/2010, 08/16/2011, 07/31/2021   Influenza, Seasonal, Injecte, Preservative Fre 07/31/2012, 08/06/2013, 08/11/2015, 07/31/2016   Influenza,inj,Quad PF,6+ Mos 08/01/2017, 08/08/2018, 07/23/2019, 08/04/2022   Influenza,inj,Quad PF,6-35 Mos 08/02/2016, 07/23/2019   Influenza,inj,quad, With Preservative 08/02/2016, 08/01/2017, 08/08/2018, 07/23/2019   Influenza,trivalent, recombinat, inj, PF 07/13/2010, 08/16/2011, 07/31/2021   Influenza-Unspecified 07/31/2012, 08/06/2013, 08/11/2015, 07/31/2016, 08/13/2020, 07/19/2021    Moderna Covid-19 Fall Seasonal Vaccine 36yrs & older 08/04/2022   PFIZER(Purple Top)SARS-COV-2 Vaccination 11/20/2019, 12/11/2019, 08/13/2020, 04/01/2021   PNEUMOCOCCAL CONJUGATE-20 10/01/2021   Pfizer Covid-19 Vaccine Bivalent Booster 102yrs & up 07/27/2021   Tdap 09/27/2016   Zoster Recombinant(Shingrix) 07/23/2019, 04/23/2020   Past Medical History:  Diagnosis Date   Anemia    Anxiety    Back pain    Bronchitis    Depression    GERD (gastroesophageal reflux disease)    Hypertension    Joint pain    Knee pain    Long term current use of antipsychotic medication 03/29/2023   Malignant neoplasm of upper-inner quadrant of left breast in female, estrogen receptor positive (HCC) 2021   s/p lumpectomy and 16 rounds radiation   Osteoarthritis    Sinus complaint    Sleep apnea    Squamous cell cancer of skin of nose 2021   [facility]: Dr Shona   Tires easily    Vitamin B 12 deficiency    Wears glasses    Social History   Socioeconomic History   Marital status: Married    Spouse name: Jimmy   Number of children: Not on file   Years of education: Not on file   Highest education level: Associate degree: academic program  Occupational History   Occupation: Librarian, academic  Tobacco Use   Smoking status: Former    Types: Cigarettes   Smokeless tobacco: Never  Vaping Use   Vaping status: Never Used  Substance and Sexual Activity   Alcohol use: Yes    Comment: couple of glasses of wine per week   Drug use: Never   Sexual activity: Yes  Other Topics Concern   Not on file  Social History Narrative   Not on  file   Social Drivers of Health   Financial Resource Strain: Low Risk  (05/21/2024)   Overall Financial Resource Strain (CARDIA)    Difficulty of Paying Living Expenses: Not very hard  Food Insecurity: No Food Insecurity (05/21/2024)   Hunger Vital Sign    Worried About Running Out of Food in the Last Year: Never true    Ran Out of Food  in the Last Year: Never true  Transportation Needs: No Transportation Needs (05/21/2024)   PRAPARE - Administrator, Civil Service (Medical): No    Lack of Transportation (Non-Medical): No  Physical Activity: Unknown (05/21/2024)   Exercise Vital Sign    Days of Exercise per Week: 1 day    Minutes of Exercise per Session: Patient declined  Stress: No Stress Concern Present (05/21/2024)   [employer] of Occupational Health - Occupational Stress Questionnaire    Feeling of Stress: Not at all  Social Connections: Socially Integrated (05/21/2024)   Social Connection and Isolation Panel    Frequency of Communication with Friends and Family: More than three times a week    Frequency of Social Gatherings with Friends and Family: Three times a week    Attends Religious Services: 1 to 4 times per year    Active Member of Clubs or Organizations: Yes    Attends Banker Meetings: 1 to 4 times per year    Marital Status: Married  Catering manager Violence: Not At Risk (03/06/2023)   Received from [facility]   HITS    Over the last 12 months how often did your partner physically hurt you?: Never    Over the last 12 months how often did your partner insult you or talk down to you?: Never    Over the last 12 months how often did your partner threaten you with physical harm?: Never    Over the last 12 months how often did your partner scream or curse at you?: Never   Past Surgical History:  Procedure Laterality Date   BIOPSY  05/25/2023   Procedure: BIOPSY;  Surgeon: Shaaron Lamar HERO, MD;  Location: [facility];  Service: Endoscopy;;   BREAST LUMPECTOMY WITH AXILLARY LYMPH NODE BIOPSY  2021   LEFT   COLONOSCOPY     COLONOSCOPY WITH PROPOFOL  N/A 05/25/2023   Procedure: COLONOSCOPY WITH PROPOFOL ;  Surgeon: Shaaron Lamar HERO, MD;  Location: [facility];  Service: Endoscopy;  Laterality: N/A;  10:15am, asa 2   REPLACEMENT TOTAL KNEE Right    ROUX-EN-Y PROCEDURE  03/2011    TUBAL LIGATION  1985   Family History  Problem Relation Age of Onset   Hyperlipidemia Mother    Colon cancer Mother 62   Diabetes Mother    Depression Mother    Anxiety disorder Mother    Breast cancer Mother 31 - 51   Heart disease Father    Hypertension Father    Sudden death Father 48   Sleep apnea Father    Hyperlipidemia Sister    Colon polyps Sister    ALS Sister 41   Colon cancer Other     Current Outpatient Medications:    estradiol (ESTRACE) 0.1 MG/GM vaginal cream, Place 1 Applicatorful vaginally at bedtime., Disp: , Rfl:    exemestane (AROMASIN) 25 MG tablet, Take 25 mg by mouth daily after breakfast., Disp: , Rfl:    NONFORMULARY OR COMPOUNDED ITEM, Semaglutide w/ b6 2.5mg /mL. Inject 10 units every 7 days x4 weeks, then 20 u every  7 d x4 weeks, then 40 u every 7 days x4 weeks, then 80 u every 7 days. Dispense QS for 1 month w/ 4 RFs, Disp: 2 each, Rfl: 4   Semaglutide-Weight Management 2.4 MG/0.75ML SOAJ, Inject 2.4 mg into the skin every Tuesday., Disp: , Rfl:    Syringe/Needle, Disp, (SYRINGE 3CC/25GX1) 25G X 1 3 ML MISC, 1 Units by Does not apply route once a week., Disp: 4 each, Rfl: 12   cyanocobalamin  (VITAMIN B12) 1000 MCG/ML injection, INJECT 1 ML (1,000 MCG) INTRAMUSCULARLY EVERY 30 DAYS, Disp: 3 mL, Rfl: 5   DULoxetine  (CYMBALTA ) 30 MG capsule, Take 3 capsules (90 mg total) by mouth daily., Disp: 270 capsule, Rfl: 4   lisinopril -hydrochlorothiazide  (ZESTORETIC ) 20-25 MG tablet, Take 1 tablet by mouth daily., Disp: 90 tablet, Rfl: 4   metoprolol  succinate (TOPROL -XL) 50 MG 24 hr tablet, Take 2 tablets (100 mg total) by mouth at bedtime. Take with or immediately following a meal., Disp: 180 tablet, Rfl: 4   rosuvastatin  (CRESTOR ) 5 MG tablet, Take 1 tablet (5 mg total) by mouth daily., Disp: 90 tablet, Rfl: 4   Vitamin D , Ergocalciferol , (DRISDOL ) 1.25 MG (50000 UNIT) CAPS capsule, Take 1 capsule (50,000 Units total) by mouth every 7 (seven) days., Disp: 12  capsule, Rfl: 4  Allergies  Allergen Reactions   Iodine     REACTION: rash   Iodinated Contrast Media Rash          ROS: Review of Systems Pertinent items noted in HPI and remainder of comprehensive ROS otherwise negative.    Physical exam BP 111/63   Pulse 80   Temp 98.7 F (37.1 C)   Ht 5' 3 (1.6 m)   Wt 176 lb (79.8 kg)   LMP 06/01/2015   SpO2 96%   BMI 31.18 kg/m  General appearance: alert, cooperative, and appears stated age Head: Normocephalic, without obvious abnormality, atraumatic Eyes: negative findings: lids and lashes normal, conjunctivae and sclerae normal, corneas clear, and pupils equal, round, reactive to light and accomodation Ears: normal TM's and external ear canals both ears Nose: Nares normal. Septum midline. Mucosa normal. No drainage or sinus tenderness. Throat: lips, mucosa, and tongue normal; teeth and gums normal Neck: no adenopathy, no carotid bruit, supple, symmetrical, trachea midline, and thyroid not enlarged, symmetric, no tenderness/mass/nodules Back: symmetric, no curvature. ROM normal. No CVA tenderness. Lungs: clear to auscultation bilaterally Heart: regular rate and rhythm, S1, S2 normal, no murmur, click, rub or gallop Abdomen: soft, non-tender; bowel sounds normal; no masses,  no organomegaly Extremities: extremities normal, atraumatic, no cyanosis or edema Pulses: 2+ and symmetric Skin: Multiple pigmented nevi along bilateral lower extremities and upper extremities.  She has what appears to be a hemangioma along the left anterior lower leg (sees Dr. Shona in [place] for dermatology) Lymph nodes: Cervical, supraclavicular, and axillary nodes normal. Neurologic: Grossly normal      05/22/2024    3:11 PM 07/26/2023   11:02 AM 03/14/2023    3:50 PM  Depression screen PHQ 2/9  Decreased Interest 0 0 0  Down, Depressed, Hopeless 0 0 0  PHQ - 2 Score 0 0 0  Altered sleeping 0 0 0  Tired, decreased energy 0 0 0  Change in appetite 0  0 0  Feeling bad or failure about yourself  0 0 0  Trouble concentrating 0 0 0  Moving slowly or fidgety/restless 0 0 0  Suicidal thoughts 0 0 0  PHQ-9 Score 0 0 0  Difficult doing  work/chores Not difficult at all Not difficult at all Not difficult at all      05/22/2024    3:10 PM 07/26/2023   11:02 AM 03/14/2023    3:50 PM 11/11/2022    3:22 PM  GAD 7 : Generalized Anxiety Score  Nervous, Anxious, on Edge 0 0 0 1  Control/stop worrying 0 0 0 0  Worry too much - different things 0 0 0 1  Trouble relaxing 0 0 0 0  Restless 0 0 0 0  Easily annoyed or irritable 0 0 0 0  Afraid - awful might happen 0 0 0 0  Total GAD 7 Score 0 0 0 2  Anxiety Difficulty Not difficult at all Not difficult at all Not difficult at all Not difficult at all   Recent Results (from the past 2160 hours)  TSH     Status: None   Collection Time: 05/15/24  9:05 AM  Result Value Ref Range   TSH 2.460 0.450 - 4.500 uIU/mL  VITAMIN D  25 Hydroxy (Vit-D Deficiency, Fractures)     Status: None   Collection Time: 05/15/24  9:05 AM  Result Value Ref Range   Vit D, 25-Hydroxy 92.5 30.0 - 100.0 ng/mL    Comment: Vitamin D  deficiency has been defined by the Institute of Medicine and an Endocrine Society practice guideline as a level of serum 25-OH vitamin D  less than 20 ng/mL (1,2). The Endocrine Society went on to further define vitamin D  insufficiency as a level between 21 and 29 ng/mL (2). 1. IOM (Institute of Medicine). 2010. Dietary reference    intakes for calcium  and D. [place]: The    [employer]. 2. Holick MF, Binkley [place], Bischoff-Ferrari HA, et al.    Evaluation, treatment, and prevention of vitamin D     deficiency: an Endocrine Society clinical practice    guideline. JCEM. 2011 Jul; 96(7):1911-30.   Lipid panel     Status: Abnormal   Collection Time: 05/15/24  9:05 AM  Result Value Ref Range   Cholesterol, Total 135 100 - 199 mg/dL   Triglycerides 831 (H) 0 - 149 mg/dL   HDL 51 >60  mg/dL   VLDL Cholesterol Cal 28 5 - 40 mg/dL   LDL Chol Calc (NIH) 56 0 - 99 mg/dL   Chol/HDL Ratio 2.6 0.0 - 4.4 ratio    Comment:                                   T. Chol/HDL Ratio                                             Men  Women                               1/2 Avg.Risk  3.4    3.3                                   Avg.Risk  5.0    4.4  2X Avg.Risk  9.6    7.1                                3X Avg.Risk 23.4   11.0   CMP14+EGFR     Status: Abnormal   Collection Time: 05/15/24  9:05 AM  Result Value Ref Range   Glucose 105 (H) 70 - 99 mg/dL   BUN 17 8 - 27 mg/dL   Creatinine, Ser 9.20 0.57 - 1.00 mg/dL   eGFR 82 >40 fO/fpw/8.26   BUN/Creatinine Ratio 22 12 - 28   Sodium 138 134 - 144 mmol/L   Potassium 3.3 (L) 3.5 - 5.2 mmol/L   Chloride 96 96 - 106 mmol/L   CO2 27 20 - 29 mmol/L   Calcium  10.0 8.7 - 10.3 mg/dL   Total Protein 6.4 6.0 - 8.5 g/dL   Albumin 4.2 3.9 - 4.9 g/dL   Globulin, Total 2.2 1.5 - 4.5 g/dL   Bilirubin Total 0.8 0.0 - 1.2 mg/dL   Alkaline Phosphatase 94 44 - 121 IU/L   AST 17 0 - 40 IU/L   ALT 10 0 - 32 IU/L  CBC with Differential/Platelet     Status: Abnormal   Collection Time: 05/15/24  9:05 AM  Result Value Ref Range   WBC 5.7 3.4 - 10.8 x10E3/uL   RBC 4.49 3.77 - 5.28 x10E6/uL   Hemoglobin 15.2 11.1 - 15.9 g/dL   Hematocrit 55.5 65.9 - 46.6 %   MCV 99 (H) 79 - 97 fL   MCH 33.9 (H) 26.6 - 33.0 pg   MCHC 34.2 31.5 - 35.7 g/dL   RDW 87.0 88.2 - 84.5 %   Platelets 279 150 - 450 x10E3/uL   Neutrophils 71 Not Estab. %   Lymphs 19 Not Estab. %   Monocytes 7 Not Estab. %   Eos 2 Not Estab. %   Basos 1 Not Estab. %   Neutrophils Absolute 4.1 1.4 - 7.0 x10E3/uL   Lymphocytes Absolute 1.1 0.7 - 3.1 x10E3/uL   Monocytes Absolute 0.4 0.1 - 0.9 x10E3/uL   EOS (ABSOLUTE) 0.1 0.0 - 0.4 x10E3/uL   Basophils Absolute 0.0 0.0 - 0.2 x10E3/uL   Immature Granulocytes 0 Not Estab. %   Immature Grans (Abs) 0.0 0.0 - 0.1  x10E3/uL  Hemoglobin A1c     Status: None   Collection Time: 05/15/24  9:12 AM  Result Value Ref Range   Hgb A1c MFr Bld 5.0 4.8 - 5.6 %    Comment:          Prediabetes: 5.7 - 6.4          Diabetes: >6.4          Glycemic control for adults with diabetes: <7.0    Est. average glucose Bld gHb Est-mCnc 97 mg/dL   Assessment/ Plan: Arland JAYSON Barnacle here for annual physical exam.   Annual physical exam  Mixed hyperlipidemia - Plan: rosuvastatin  (CRESTOR ) 5 MG tablet  Essential hypertension - Plan: lisinopril -hydrochlorothiazide  (ZESTORETIC ) 20-25 MG tablet, metoprolol  succinate (TOPROL -XL) 50 MG 24 hr tablet, Basic metabolic panel with GFR  Obesity (BMI 30.0-34.9)  S/P gastric bypass  Vitamin B 12 deficiency - Plan: cyanocobalamin  (VITAMIN B12) 1000 MCG/ML injection  Vitamin D  deficiency - Plan: Vitamin D , Ergocalciferol , (DRISDOL ) 1.25 MG (50000 UNIT) CAPS capsule  Osteopenia of left femoral neck  Generalized anxiety disorder - Plan: DULoxetine  (CYMBALTA ) 30 MG capsule  Malignant neoplasm of  upper-inner quadrant of left breast in female, estrogen receptor positive (HCC)  Cholesterol is controlled with exception of triglycerides with current regimen.  She will continue statin at low-dose  Blood pressure controlled.  Continue current regimen.  Mildly hypokalemic on recent lab draw.  Future BMP placed.  Taking OTC vitamin  Currently being treated with GLP through a weight loss company and tolerating medication without difficulty.  She is having excellent response to this with almost a 20 pound weight loss since her last visit in September  Gastric bypass history with history of B12 and vitamin D  deficiencies.  These been renewed for patient.  Continue every 2 year DEXA scans given history of osteopenia in the left femoral neck and vitamin deficiency as above  Anxiety disorder is chronic and stable with Cymbalta  90 mg and since her psychiatrist has left our system I am going to  take over this medication  She is not currently taking her medication for history of breast cancer but has an appointment with her specialist next month.  Counseled on healthy lifestyle choices, including diet (rich in fruits, vegetables and lean meats and low in salt and simple carbohydrates) and exercise (at least 30 minutes of moderate physical activity daily).  Patient to follow up 1 year for CPE  Glenden Rossell M. Jolinda, DO

## 2024-05-22 NOTE — Telephone Encounter (Signed)
 Order is placed.

## 2024-05-22 NOTE — Telephone Encounter (Signed)
 Pt seen on 05/22/2024, was told to come back in two weeks to have potassium checked.Pt has appt for labs on 06/05/24 please place lab orders  Pt has CPE appt on 05/27/2025 and needs lab orders placed for this has lab appt on 05/15/2025

## 2024-05-29 ENCOUNTER — Encounter: Payer: Self-pay | Admitting: Family Medicine

## 2024-06-04 ENCOUNTER — Encounter: Admitting: Family Medicine

## 2024-06-05 ENCOUNTER — Other Ambulatory Visit

## 2024-06-05 DIAGNOSIS — I10 Essential (primary) hypertension: Secondary | ICD-10-CM

## 2024-06-05 DIAGNOSIS — Z01419 Encounter for gynecological examination (general) (routine) without abnormal findings: Secondary | ICD-10-CM | POA: Diagnosis not present

## 2024-06-05 LAB — BAYER DCA HB A1C WAIVED: HB A1C (BAYER DCA - WAIVED): 4.9 % (ref 4.8–5.6)

## 2024-06-06 LAB — BASIC METABOLIC PANEL WITH GFR
BUN/Creatinine Ratio: 14 (ref 12–28)
BUN: 11 mg/dL (ref 8–27)
CO2: 24 mmol/L (ref 20–29)
Calcium: 9.7 mg/dL (ref 8.7–10.3)
Chloride: 104 mmol/L (ref 96–106)
Creatinine, Ser: 0.78 mg/dL (ref 0.57–1.00)
Glucose: 83 mg/dL (ref 70–99)
Potassium: 4.3 mmol/L (ref 3.5–5.2)
Sodium: 147 mmol/L — ABNORMAL HIGH (ref 134–144)
eGFR: 84 mL/min/1.73 (ref >59)

## 2024-07-26 DIAGNOSIS — N952 Postmenopausal atrophic vaginitis: Secondary | ICD-10-CM | POA: Diagnosis not present

## 2024-07-26 DIAGNOSIS — Z9189 Other specified personal risk factors, not elsewhere classified: Secondary | ICD-10-CM | POA: Diagnosis not present

## 2024-07-26 DIAGNOSIS — Z170 Estrogen receptor positive status [ER+]: Secondary | ICD-10-CM | POA: Diagnosis not present

## 2024-07-26 DIAGNOSIS — Z79811 Long term (current) use of aromatase inhibitors: Secondary | ICD-10-CM | POA: Diagnosis not present

## 2024-07-26 DIAGNOSIS — C50212 Malignant neoplasm of upper-inner quadrant of left female breast: Secondary | ICD-10-CM | POA: Diagnosis not present

## 2024-07-26 DIAGNOSIS — Z5181 Encounter for therapeutic drug level monitoring: Secondary | ICD-10-CM | POA: Diagnosis not present

## 2024-07-26 DIAGNOSIS — Z1231 Encounter for screening mammogram for malignant neoplasm of breast: Secondary | ICD-10-CM | POA: Diagnosis not present

## 2024-07-26 DIAGNOSIS — Z780 Asymptomatic menopausal state: Secondary | ICD-10-CM | POA: Diagnosis not present

## 2024-08-13 DIAGNOSIS — K080 Exfoliation of teeth due to systemic causes: Secondary | ICD-10-CM | POA: Diagnosis not present

## 2024-08-20 DIAGNOSIS — K080 Exfoliation of teeth due to systemic causes: Secondary | ICD-10-CM | POA: Diagnosis not present

## 2024-09-27 DIAGNOSIS — J019 Acute sinusitis, unspecified: Secondary | ICD-10-CM | POA: Diagnosis not present

## 2024-09-27 DIAGNOSIS — B9689 Other specified bacterial agents as the cause of diseases classified elsewhere: Secondary | ICD-10-CM | POA: Diagnosis not present

## 2024-09-30 DIAGNOSIS — Z780 Asymptomatic menopausal state: Secondary | ICD-10-CM | POA: Diagnosis not present

## 2024-09-30 DIAGNOSIS — M85851 Other specified disorders of bone density and structure, right thigh: Secondary | ICD-10-CM | POA: Diagnosis not present

## 2024-09-30 DIAGNOSIS — M8589 Other specified disorders of bone density and structure, multiple sites: Secondary | ICD-10-CM | POA: Diagnosis not present

## 2024-10-07 ENCOUNTER — Ambulatory Visit: Payer: Self-pay

## 2024-10-07 VITALS — BP 111/63 | HR 80 | Ht 63.0 in | Wt 176.0 lb

## 2024-10-07 DIAGNOSIS — Z0000 Encounter for general adult medical examination without abnormal findings: Secondary | ICD-10-CM

## 2024-10-07 NOTE — Progress Notes (Signed)
 Chief Complaint  Patient presents with   Medicare Wellness     Subjective:   Patricia Carr is a 68 y.o. female who presents for a Medicare Annual Wellness Visit.  Visit info / Clinical Intake: Medicare Wellness Visit Type:: Subsequent Annual Wellness Visit Persons participating in visit and providing information:: patient Medicare Wellness Visit Mode:: Telephone If telephone:: video declined Since this visit was completed virtually, some vitals may be partially provided or unavailable. Missing vitals are due to the limitations of the virtual format.: Documented vitals are patient reported If Telephone or Video please confirm:: I connected with patient using audio/video enable telemedicine. I verified patient identity with two identifiers, discussed telehealth limitations, and patient agreed to proceed. Patient Location:: home Provider Location:: home office Interpreter Needed?: No Pre-visit prep was completed: yes AWV questionnaire completed by patient prior to visit?: no Living arrangements:: (Patient-Rptd) lives with spouse/significant other Patient's Overall Health Status Rating: (Patient-Rptd) good Typical amount of pain: (Patient-Rptd) some Does pain affect daily life?: (Patient-Rptd) no Are you currently prescribed opioids?: no  Dietary Habits and Nutritional Risks How many meals a day?: (Patient-Rptd) 3 Eats fruit and vegetables daily?: (Patient-Rptd) yes Most meals are obtained by: (Patient-Rptd) eating out In the last 2 weeks, have you had any of the following?: none Diabetic:: no  Functional Status Activities of Daily Living (to include ambulation/medication): (Patient-Rptd) Independent Ambulation: (Patient-Rptd) Independent Medication Administration: (Patient-Rptd) Independent Home Management (perform basic housework or laundry): (Patient-Rptd) Independent Manage your own finances?: (Patient-Rptd) yes Primary transportation is: (Patient-Rptd) driving  Fall  Screening Falls in the past year?: (Patient-Rptd) 0 Number of falls in past year: 0 Was there an injury with Fall?: 1 Fall Risk Category Calculator: 1 Patient Fall Risk Level: Low Fall Risk  Fall Risk Patient at Risk for Falls Due to: No Fall Risks Fall risk Follow up: Falls evaluation completed; Education provided  Home and Transportation Safety: All rugs have non-skid backing?: (Patient-Rptd) yes All stairs or steps have railings?: (Patient-Rptd) yes Grab bars in the bathtub or shower?: (Patient-Rptd) yes Have non-skid surface in bathtub or shower?: (!) (Patient-Rptd) no Good home lighting?: (Patient-Rptd) yes Regular seat belt use?: (Patient-Rptd) yes Hospital stays in the last year:: (Patient-Rptd) no  Cognitive Assessment Difficulty concentrating, remembering, or making decisions? : (Patient-Rptd) no Will 6CIT or Mini Cog be Completed: yes What year is it?: 0 points What month is it?: 0 points Give patient an address phrase to remember (5 components): [address] [place], [place] About what time is it?: 0 points Count backwards from 20 to 1: 0 points Say the months of the year in reverse: 0 points Repeat the address phrase from earlier: 0 points 6 CIT Score: 0 points  Advance Directives (For Healthcare) Does Patient Have a Medical Advance Directive?: No Would patient like information on creating a medical advance directive?: No - Patient declined  Reviewed/Updated  Reviewed/Updated: Reviewed All (Medical, Surgical, Family, Medications, Allergies, Care Teams, Patient Goals); Medical History; Surgical History; Family History; Medications; Allergies; Care Teams; Patient Goals    Allergies (verified) Iodine and Iodinated contrast media   Current Medications (verified) Outpatient Encounter Medications as of 10/07/2024  Medication Sig   cyanocobalamin  (VITAMIN B12) 1000 MCG/ML injection INJECT 1 ML (1,000 MCG) INTRAMUSCULARLY EVERY 30 DAYS   DULoxetine  (CYMBALTA ) 30 MG  capsule Take 3 capsules (90 mg total) by mouth daily.   estradiol (ESTRACE) 0.1 MG/GM vaginal cream Place 1 Applicatorful vaginally at bedtime.   exemestane (AROMASIN) 25 MG tablet Take 25 mg  by mouth daily after breakfast.   lisinopril -hydrochlorothiazide  (ZESTORETIC ) 20-25 MG tablet Take 1 tablet by mouth daily.   metoprolol  succinate (TOPROL -XL) 50 MG 24 hr tablet Take 2 tablets (100 mg total) by mouth at bedtime. Take with or immediately following a meal.   NONFORMULARY OR COMPOUNDED ITEM Semaglutide w/ b6 2.5mg /mL. Inject 10 units every 7 days x4 weeks, then 20 u every 7 d x4 weeks, then 40 u every 7 days x4 weeks, then 80 u every 7 days. Dispense QS for 1 month w/ 4 RFs   rosuvastatin  (CRESTOR ) 5 MG tablet Take 1 tablet (5 mg total) by mouth daily.   Semaglutide-Weight Management 2.4 MG/0.75ML SOAJ Inject 2.4 mg into the skin every Tuesday.   Syringe/Needle, Disp, (SYRINGE 3CC/25GX1) 25G X 1 3 ML MISC 1 Units by Does not apply route once a week.   Vitamin D , Ergocalciferol , (DRISDOL ) 1.25 MG (50000 UNIT) CAPS capsule Take 1 capsule (50,000 Units total) by mouth every 7 (seven) days.   No facility-administered encounter medications on file as of 10/07/2024.    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 03/29/2023   Malignant neoplasm of upper-inner quadrant of left breast in female, estrogen receptor positive (HCC) 2021   s/p lumpectomy and 16 rounds radiation   Osteoarthritis    Sinus complaint    Sleep apnea    Squamous cell cancer of skin of nose 2021   [facility]: Dr Shona Rough easily    Vitamin B 12 deficiency    Wears glasses    Past Surgical History:  Procedure Laterality Date   BIOPSY  05/25/2023    Procedure: BIOPSY;  Surgeon: Shaaron Lamar HERO, MD;  Location: [facility];  Service: Endoscopy;;   BREAST LUMPECTOMY WITH AXILLARY LYMPH NODE BIOPSY  2021   LEFT   BREAST SURGERY  2022   Cancer   COLONOSCOPY     COLONOSCOPY WITH PROPOFOL  N/A 05/25/2023   Procedure: COLONOSCOPY WITH PROPOFOL ;  Surgeon: Shaaron Lamar HERO, MD;  Location: [facility];  Service: Endoscopy;  Laterality: N/A;  10:15am, asa 2   JOINT REPLACEMENT  2019   Right knee   REPLACEMENT TOTAL KNEE Right    ROUX-EN-Y PROCEDURE  03/2011   SMALL INTESTINE SURGERY  2012   R N Y   TUBAL LIGATION  1985   Family History  Problem Relation Age of Onset   Hyperlipidemia Mother    Colon cancer Mother 53   Diabetes Mother    Depression Mother    Anxiety disorder Mother    Breast cancer Mother 40 [phone] 57   Arthritis Mother    Cancer Mother    Hypertension Mother    Heart disease Father    Hypertension Father    Sudden death Father 32   Sleep apnea Father    Hyperlipidemia Sister    Colon polyps Sister    ALS Sister 12   Anxiety disorder Sister    Depression Sister    Colon cancer Other    Social History   Occupational History   Occupation: Librarian, Academic  Tobacco Use   Smoking status:  Former    Types: Cigarettes   Smokeless tobacco: Never  Vaping Use   Vaping status: Never Used  Substance and Sexual Activity   Alcohol use: Yes    Comment: couple of glasses of wine per week   Drug use: Never   Sexual activity: Yes   Tobacco Counseling Counseling given: Not Answered  SDOH Screenings   Food Insecurity: No Food Insecurity (10/01/2024)  Housing: Unknown (10/01/2024)  Transportation Needs: No Transportation Needs (10/01/2024)  Utilities: Not At Risk (10/07/2024)  Alcohol Screen: Low Risk  (10/01/2024)  Depression (PHQ2-9): Low Risk  (10/07/2024)  Financial Resource Strain: Low Risk  (10/01/2024)  Physical Activity: Inactive (10/01/2024)  Social Connections: Moderately Integrated (10/01/2024)  Stress: No  Stress Concern Present (10/01/2024)  Tobacco Use: Low Risk (09/27/2024)   Received from [facility] Literacy: Adequate Health Literacy (10/07/2024)   See flowsheets for full screening details  Depression Screen PHQ 2 & 9 Depression Scale- Over the past 2 weeks, how often have you been bothered by any of the following problems? Little interest or pleasure in doing things: 0 Feeling down, depressed, or hopeless (PHQ Adolescent also includes...irritable): 1 PHQ-2 Total Score: 1 Trouble falling or staying asleep, or sleeping too much: 0 Feeling tired or having little energy: 0 Poor appetite or overeating (PHQ Adolescent also includes...weight loss): 0 Feeling bad about yourself - or that you are a failure or have let yourself or your family down: 0 Trouble concentrating on things, such as reading the newspaper or watching television (PHQ Adolescent also includes...like school work): 0 Moving or speaking so slowly that other people could have noticed. Or the opposite - being so fidgety or restless that you have been moving around a lot more than usual: 0 Thoughts that you would be better off dead, or of hurting yourself in some way: 0 PHQ-9 Total Score: 0 If you checked off any problems, how difficult have these problems made it for you to do your work, take care of things at home, or get along with other people?: Not difficult at all     Goals Addressed             This Visit's Progress    Stay Active and Independent               Objective:    Today's Vitals   10/07/24 1156  BP: 111/63  Pulse: 80  Weight: 176 lb (79.8 kg)  Height: 5' 3 (1.6 m)   Body mass index is 31.18 kg/m.  Hearing/Vision screen Vision Screening - Comments:: June 2025/pt wear contacts [facility] in [place] Immunizations and Health Maintenance Health Maintenance  Topic Date Due   Mammogram  04/09/2024   Bone Density Scan  09/09/2024   COVID-19 Vaccine (8 - Pfizer risk 2025-26  season) 02/12/2025   Medicare Annual Wellness (AWV)  10/07/2025   Fecal DNA (Cologuard)  04/15/2026   DTaP/Tdap/Td (2 - Td or Tdap) 09/27/2026   Pneumococcal Vaccine: 50+ Years  Completed   Influenza Vaccine  Completed   Hepatitis C Screening  Completed   Zoster Vaccines- Shingrix  Completed   Meningococcal B Vaccine  Aged Out   Colonoscopy  Discontinued        Assessment/Plan:  This is a routine wellness examination for Cheila.  Patient Care Team: Jolinda Norene HERO, DO as PCP - General (Family Medicine)  I have personally reviewed and noted the following in the patient's chart:   Medical and social history Use  of alcohol, tobacco or illicit drugs  Current medications and supplements including opioid prescriptions. Functional ability and status Nutritional status Physical activity Advanced directives List of other physicians Hospitalizations, surgeries, and ER visits in previous 12 months Vitals Screenings to include cognitive, depression, and falls Referrals and appointments  No orders of the defined types were placed in this encounter.  In addition, I have reviewed and discussed with patient certain preventive protocols, quality metrics, and best practice recommendations. A written personalized care plan for preventive services as well as general preventive health recommendations were provided to patient.   Ozie Ned, CMA   10/07/2024   Return in 1 year (on 10/07/2025).  After Visit Summary: (MyChart) Due to this being a telephonic visit, the after visit summary with patients personalized plan was offered to patient via MyChart   Nurse Notes: n/a

## 2024-10-07 NOTE — Patient Instructions (Signed)
 Patricia Carr,  Thank you for taking the time for your Medicare Wellness Visit. I appreciate your continued commitment to your health goals. Please review the care plan we discussed, and feel free to reach out if I can assist you further.  Please note that Annual Wellness Visits do not include a physical exam. Some assessments may be limited, especially if the visit was conducted virtually. If needed, we may recommend an in-person follow-up with your provider.  Ongoing Care Seeing your primary care provider every 3 to 6 months helps us  monitor your health and provide consistent, personalized care.   Referrals If a referral was made during today's visit and you haven't received any updates within two weeks, please contact the referred provider directly to check on the status.  Recommended Screenings:  Health Maintenance  Topic Date Due   Medicare Annual Wellness Visit  Never done   Breast Cancer Screening  04/09/2024   Flu Shot  05/31/2024   COVID-19 Vaccine (7 - 2025-26 season) 07/01/2024   Osteoporosis screening with Bone Density Scan  09/09/2024   Cologuard (Stool DNA test)  04/15/2026   DTaP/Tdap/Td vaccine (2 - Td or Tdap) 09/27/2026   Pneumococcal Vaccine for age over 76  Completed   Hepatitis C Screening  Completed   Zoster (Shingles) Vaccine  Completed   Meningitis B Vaccine  Aged Out   Colon Cancer Screening  Discontinued       10/01/2024   11:18 AM  Advanced Directives  Does Patient Have a Medical Advance Directive? No  Would patient like information on creating a medical advance directive? No - Patient declined    Vision: Annual vision screenings are recommended for early detection of glaucoma, cataracts, and diabetic retinopathy. These exams can also reveal signs of chronic conditions such as diabetes and high blood pressure.  Dental: Annual dental screenings help detect early signs of oral cancer, gum disease, and other conditions linked to overall health, including  heart disease and diabetes.  Please see the attached documents for additional preventive care recommendations.

## 2025-05-15 ENCOUNTER — Other Ambulatory Visit: Payer: Self-pay

## 2025-05-27 ENCOUNTER — Encounter: Payer: Self-pay | Admitting: Family Medicine
